# Patient Record
Sex: Female | Born: 1955
Health system: Southern US, Community
[De-identification: ages and names within clinical notes are randomized; demographics above are authoritative.]

## PROBLEM LIST (undated history)

## (undated) DIAGNOSIS — K635 Polyp of colon: Secondary | ICD-10-CM

## (undated) DIAGNOSIS — F419 Anxiety disorder, unspecified: Secondary | ICD-10-CM

## (undated) DIAGNOSIS — I10 Essential (primary) hypertension: Secondary | ICD-10-CM

## (undated) DIAGNOSIS — E7800 Pure hypercholesterolemia, unspecified: Secondary | ICD-10-CM

## (undated) HISTORY — DX: Anxiety disorder, unspecified: F41.9

## (undated) HISTORY — PX: ABDOMINAL HYSTERECTOMY: SHX81

## (undated) HISTORY — DX: Pure hypercholesterolemia, unspecified: E78.00

## (undated) HISTORY — PX: COLONOSCOPY: SHX5424

---

## 1999-06-13 ENCOUNTER — Emergency Department (HOSPITAL_COMMUNITY): Admission: EM | Admit: 1999-06-13 | Discharge: 1999-06-13 | Payer: Self-pay | Admitting: Emergency Medicine

## 1999-09-21 ENCOUNTER — Encounter: Payer: Self-pay | Admitting: Family Medicine

## 1999-09-21 ENCOUNTER — Ambulatory Visit (HOSPITAL_COMMUNITY): Admission: RE | Admit: 1999-09-21 | Discharge: 1999-09-21 | Payer: Self-pay | Admitting: Family Medicine

## 2002-05-31 ENCOUNTER — Other Ambulatory Visit: Admission: RE | Admit: 2002-05-31 | Discharge: 2002-05-31 | Payer: Self-pay | Admitting: Obstetrics & Gynecology

## 2003-11-22 ENCOUNTER — Emergency Department (HOSPITAL_COMMUNITY): Admission: EM | Admit: 2003-11-22 | Discharge: 2003-11-22 | Payer: Self-pay | Admitting: Emergency Medicine

## 2004-02-11 ENCOUNTER — Ambulatory Visit: Payer: Self-pay | Admitting: General Practice

## 2005-02-23 ENCOUNTER — Ambulatory Visit: Payer: Self-pay | Admitting: General Practice

## 2008-09-03 LAB — HM COLONOSCOPY: HM Colonoscopy: ABNORMAL

## 2010-09-07 LAB — HM PAP SMEAR: HM Pap smear: NORMAL

## 2011-02-23 LAB — HM DEXA SCAN

## 2011-03-06 LAB — HM MAMMOGRAPHY

## 2011-05-04 ENCOUNTER — Emergency Department (HOSPITAL_COMMUNITY)
Admission: EM | Admit: 2011-05-04 | Discharge: 2011-05-04 | Disposition: A | Discharge status: Home / Self Care | Payer: No Typology Code available for payment source | Source: Home / Self Care

## 2011-05-04 ENCOUNTER — Emergency Department (INDEPENDENT_AMBULATORY_CARE_PROVIDER_SITE_OTHER): Payer: No Typology Code available for payment source

## 2011-05-04 ENCOUNTER — Encounter (HOSPITAL_COMMUNITY): Payer: Self-pay | Admitting: Emergency Medicine

## 2011-05-04 DIAGNOSIS — J40 Bronchitis, not specified as acute or chronic: Secondary | ICD-10-CM

## 2011-05-04 HISTORY — DX: Essential (primary) hypertension: I10

## 2011-05-04 MED ORDER — BENZONATATE 100 MG PO CAPS: 100.0000 mg | ORAL_CAPSULE | Freq: Three times a day (TID) | ORAL | Status: AC | PRN

## 2011-05-04 MED ORDER — ALBUTEROL SULFATE HFA 108 (90 BASE) MCG/ACT IN AERS: 2.0000 | INHALATION_SPRAY | RESPIRATORY_TRACT | Status: DC | PRN

## 2011-05-04 NOTE — ED Provider Notes (Signed)
History     CSN: [id number]  Arrival date & time 05/04/11  1808   None     No chief complaint on file.   (Consider location/radiation/quality/duration/timing/severity/associated sxs/prior treatment) HPI Comments: Patient states she began getting sick 5 days ago with scratchy throat, cough, chills and fever to 101.  Patient states cough has been productive of mucus with small amount of blood in it.  States that she began taking over the counter cough medication and has since started having coughing fits during which she has difficulty stopping her cough.  Denies shortness of breath or chest pain . Patient does not have a hx of asthma or smoking.    The history is provided by the patient.    No past medical history on file.  No past surgical history on file.  No family history on file.  History  Substance Use Topics  . Smoking status: Not on file  . Smokeless tobacco: Not on file  . Alcohol Use: Not on file    OB History    No data available      Review of Systems  HENT: Negative for trouble swallowing.   Respiratory: Negative for shortness of breath.   Cardiovascular: Negative for chest pain.  All other systems reviewed and are negative.    Allergies  Review of patient's allergies indicates not on file.  Home Medications  No current outpatient prescriptions on file.  BP 143/82  Pulse 94  Temp(Src) 99.4 F (37.4 C) (Oral)  Resp 16  SpO2 98%  Physical Exam  Nursing note and vitals reviewed. Constitutional: She is oriented to person, place, and time. She appears well-developed and well-nourished.  HENT:  Head: Normocephalic and atraumatic.  Nose: Rhinorrhea present.  Mouth/Throat: Uvula is midline. Mucous membranes are not dry. No uvula swelling. Posterior oropharyngeal erythema present. No oropharyngeal exudate, posterior oropharyngeal edema or tonsillar abscesses.  Neck: Trachea normal, normal range of motion and phonation normal. Neck supple. No tracheal  tenderness present. No rigidity. No tracheal deviation and normal range of motion present.  Cardiovascular: Normal rate and regular rhythm.   Pulmonary/Chest: Effort normal and breath sounds normal. No stridor. No respiratory distress. She has no wheezes. She exhibits no tenderness.       ? Soft rales left base  Neurological: She is alert and oriented to person, place, and time.    ED Course  Procedures (including critical care time)  Labs Reviewed - No data to display Dg Chest 2 View  05/04/2011  *RADIOLOGY REPORT*  Clinical Data: 56 year old with cough.  CHEST - 2 VIEW  Comparison: None.  Findings: Two views of the chest were obtained.  The lungs are clear bilaterally. Heart and mediastinum are within normal limits. The trachea is midline.  IMPRESSION: No acute chest findings.  Original Report Authenticated By: Richarda Overlie, M.D.     1. Bronchitis       MDM  Patient with 5 days of cough, nasal congestion, now with coughing "spasms."  Patient's CXR is clear, oxygenating well, no coughing during exam.  Small amount of blood in sputum is likely from irritation from coughing given other cold symptoms and fever.  Onset of cough was gradual. Patient denies SOB.  Doubt pneumonia, doubt PE.  Patient d/c home with albuterol inhaler and tessalon perles as cough is worse during the day than at night.  Given precautions, reasons for immediate return.  Patient verbalizes understanding and agreement with plan.  Dillard Cannon [place], [place] 05/04/11 2127

## 2011-05-04 NOTE — ED Notes (Signed)
Cough, c/o head congestion, congestion with blood tinged, slight fever.

## 2011-05-05 NOTE — ED Notes (Signed)
Call from patient on answering machine, indicating she has questions regarding her mediations. No answer when number left on machine was called. Left message  on voice mail for patient to call back if she still had questions

## 2011-05-05 NOTE — ED Provider Notes (Signed)
Medical screening examination/treatment/procedure(s) were performed by non-physician practitioner and as supervising physician I was immediately available for consultation/collaboration.   [facility]; MD   Sharin Grave, MD 05/05/11 [phone]

## 2011-05-07 NOTE — ED Notes (Signed)
Pt came in today asking about her meds and why she wasn't given an antibiotic.  States she is still coughing, has not gotten worse, but she thought it would be gone by now.  Symptom onset 1 week ago.  I explained to her that it usually takes 7-10 days or more for bronchitis to get better.  Encouraged her to continue the meds, but return here if she is not getting any better next week or gets worse.  She voiced understanding.

## 2012-02-10 ENCOUNTER — Encounter: Payer: Self-pay | Admitting: *Deleted

## 2012-02-10 ENCOUNTER — Encounter: Payer: 59 | Attending: Family Medicine | Admitting: *Deleted

## 2012-02-10 VITALS — Ht 63.0 in | Wt 205.0 lb

## 2012-02-10 DIAGNOSIS — I10 Essential (primary) hypertension: Secondary | ICD-10-CM | POA: Insufficient documentation

## 2012-02-10 DIAGNOSIS — R7309 Other abnormal glucose: Secondary | ICD-10-CM | POA: Insufficient documentation

## 2012-02-10 DIAGNOSIS — Z713 Dietary counseling and surveillance: Secondary | ICD-10-CM | POA: Insufficient documentation

## 2012-02-10 DIAGNOSIS — E7800 Pure hypercholesterolemia, unspecified: Secondary | ICD-10-CM | POA: Insufficient documentation

## 2012-02-10 DIAGNOSIS — E669 Obesity, unspecified: Secondary | ICD-10-CM | POA: Insufficient documentation

## 2012-02-10 NOTE — Progress Notes (Signed)
  Medical Nutrition Therapy:  Appt start time: 1030 end time:  1130.  Assessment:  Obesity, Prediabetes, HTN, Elevated cholesterol. Laura Marsh is a [employer] employee here today for MNT.  Works at [employer]. States lunch is brought in by reps almost daily.  Excessive CHO and fat intake noted.  Does exercise 4-5 days/week for 40-60 min. States her BG and cholesterol was high at MD, but unable to recall the lab values. She has them at home.  Very interested in Bariatric Surgery. Gave booklet and explained the surgeries we do at [facility]. She plans to attend seminar on 02/15/12.    MEDICATIONS: See medication list.   DIETARY INTAKE:  Usual eating pattern includes 3 meals and 0-1 snacks per day.  24-hr recall:  B (AM): 6 pk nabs, coffee w/ creamer (10-12 oz) and 4 splendas  Snk ( AM): None  L (PM): Zoe's grilled chicken strips (7) on salad w/ [place] dressing (1/2c), 3 pita wedges, miniature cupcake; sweet tea (8-10 oz) Snk ( PM): None D (PM): 2 slices pepperoni pizza (frozen), 8-10 [place] fruit punch (12g sugar) Snk (PM): None Beverages: Punch, sweet tea, diet soda (not often), water (30-35 oz/day)  Usual physical activity: Usually walks 2-3 times/week @ 35-40 min (2 miles around [facility]); Zumba 1-2 times/week @ 60 min   Estimated energy needs: 1200 calories 135-150 g carbohydrates 80-85 g protein 35-40 g fat  Progress Towards Goal(s):  In progress.   Nutritional Diagnosis:  D'Hanis-3.3 Overweight/obesity As related to excessive CHO intake.  As evidenced by patient reported food recall and a BMI of 36.3 kg/m^2.    Intervention:  Nutrition education regarding carb metabolism, T2DM medications and how they work, carb counting, and portion control. Also discussed bariatric surgery and the differences between RYGB, LAGB, and Sleeve.  Handouts given during visit include:  Living Well with Diabetes  Meal Planning Card  Low CHO Snack List  30g and 45g CHO Meal Ideas  Bariatric Surgery  booklet  Monitoring/Evaluation:  Dietary intake, exercise, and body weight in 4 week(s).

## 2012-02-10 NOTE — Patient Instructions (Addendum)
Goals:   Switch to 1% milk and 1/2 & 1/2 tea  Decrease carb intake to 30g per meal/15g per snack (have protein with all carbs)  Limit high fat foods (i.e. Ranch dressing, etc)  Continue exercise regimen

## 2012-03-09 ENCOUNTER — Ambulatory Visit: Payer: 59 | Admitting: *Deleted

## 2012-03-11 ENCOUNTER — Encounter: Payer: Self-pay | Admitting: Dietician

## 2012-03-11 ENCOUNTER — Encounter: Payer: 59 | Attending: Family Medicine | Admitting: Dietician

## 2012-03-11 DIAGNOSIS — E669 Obesity, unspecified: Secondary | ICD-10-CM | POA: Insufficient documentation

## 2012-03-11 DIAGNOSIS — E7800 Pure hypercholesterolemia, unspecified: Secondary | ICD-10-CM | POA: Insufficient documentation

## 2012-03-11 DIAGNOSIS — Z713 Dietary counseling and surveillance: Secondary | ICD-10-CM | POA: Insufficient documentation

## 2012-03-11 DIAGNOSIS — R7309 Other abnormal glucose: Secondary | ICD-10-CM | POA: Insufficient documentation

## 2012-03-11 DIAGNOSIS — I10 Essential (primary) hypertension: Secondary | ICD-10-CM | POA: Insufficient documentation

## 2012-03-11 NOTE — Patient Instructions (Addendum)
Goals:  Follow Diabetes Meal Plan as instructed  Eat 3 meals and 2 snacks, every 3-5 hrs  Limit carbohydrate intake to 30 to 45grams carbohydrate/meal  Limit carbohydrate intake to 15 grams carbohydrate/snack  Add lean protein foods to meals/snacks  Monitor glucose levels as instructed by your doctor  Aim for 30 to 60  mins of physical activity daily  Bring food record and glucose log to your next nutrition visit 

## 2012-03-11 NOTE — Progress Notes (Signed)
Patient was seen on 03/11/12 for the complete series of three diabetes self-management courses at [facility]. Current A1c 6.3 on 02/2012  The following learning objectives were met by the patient during this course: Core 1:  Gain an understanding of diabetes and what causes it  Learn how diabetes is treated and the goals of treatment  Learn why, how and when to test BG  Learn how carbohydrate affects your glucose  Receive a personal food plan and learn how to count carbohydrates  Discover how physical activity enhances glucose control and overall health  Begin to gain confidence in your ability to manage diabetes  Handouts given during this class include:  Type 2 Diabetes: Basics Book  My Food Plan Book  Food and Activity Log  Core 2:  Describe causes, symptoms and treatment of hypoglycemia and hyperglycemia  Learn how to care for your glucose meter and strips  Explain how to manage diabetes during illness  List strategies to follow meal plan when dining out  Describe the effects of alcohol on glucose and how to use it safely  Describe problem solving skills for day-to-day glucose challenges  Describe ways to remain physically active  Describe the impact of regular activity on insulin resistance  Handouts given in this class:  Refrigerator magnet for Sick Day Guidelines  [facility] Oral Medication and Insulin handout  Core 3  Describe how diabetes changes over time  Understand why glucose Chun Sellen be out of target  Learn how diabetes changes over time  Learn about blood pressure, cholesterol, and heart health  Learn about lowering dietary fat and sodium  Understand the benefits of physical activity for heart health  Develop problem solving skills for times when glucose numbers are puzzling  Develop strategies for creating life balance  Learn how to identify if your treatment plan needs to change  Develop strategies for  dealing with stress, depression and staying motivated  Identify healthy weight-loss plans  Gain confidence that you can succeed in caring for your diabetes  Establish 2-3 goals that they will plan to diligently work on until they return                   for the free 4-month follow-up visit   The following handouts were given in class:  3 Month Follow Up Visit handout  Goal setting handout  Class evaluation form  Your patient has established the following 3 month goals for diabetes self-care:  Count Carbohydrates at most meals  Increase exercise to 5 days a week be active 30 minutes or more 3 times a week  Exercise to manage stress  Follow-Up Plan: Patient was offered a 3 month follow-up visit for diabetes self-management education.

## 2012-04-13 ENCOUNTER — Other Ambulatory Visit: Payer: Self-pay | Admitting: Family Medicine

## 2012-04-13 ENCOUNTER — Ambulatory Visit (INDEPENDENT_AMBULATORY_CARE_PROVIDER_SITE_OTHER): Payer: 59 | Admitting: Family Medicine

## 2012-04-13 ENCOUNTER — Encounter: Payer: Self-pay | Admitting: Family Medicine

## 2012-04-13 VITALS — BP 130/80 | HR 108 | Temp 98.1°F | Wt 209.0 lb

## 2012-04-13 DIAGNOSIS — E119 Type 2 diabetes mellitus without complications: Secondary | ICD-10-CM | POA: Insufficient documentation

## 2012-04-13 DIAGNOSIS — J069 Acute upper respiratory infection, unspecified: Secondary | ICD-10-CM

## 2012-04-13 DIAGNOSIS — I152 Hypertension secondary to endocrine disorders: Secondary | ICD-10-CM | POA: Insufficient documentation

## 2012-04-13 DIAGNOSIS — Z7189 Other specified counseling: Secondary | ICD-10-CM

## 2012-04-13 DIAGNOSIS — E785 Hyperlipidemia, unspecified: Secondary | ICD-10-CM | POA: Insufficient documentation

## 2012-04-13 DIAGNOSIS — Z7689 Persons encountering health services in other specified circumstances: Secondary | ICD-10-CM

## 2012-04-13 DIAGNOSIS — I10 Essential (primary) hypertension: Secondary | ICD-10-CM

## 2012-04-13 MED ORDER — TRIAMTERENE-HCTZ 37.5-25 MG PO TABS: 1.0000 | ORAL_TABLET | Freq: Every day | ORAL | Status: DC

## 2012-04-13 MED ORDER — LISINOPRIL 20 MG PO TABS: 20.0000 mg | ORAL_TABLET | Freq: Every day | ORAL | Status: DC

## 2012-04-13 MED ORDER — GUAIFENESIN-CODEINE 100-10 MG/5ML PO SYRP: 5.0000 mL | ORAL_SOLUTION | Freq: Three times a day (TID) | ORAL | Status: DC | PRN

## 2012-04-13 MED ORDER — METFORMIN HCL ER 500 MG PO TB24: 500.0000 mg | ORAL_TABLET | Freq: Every day | ORAL | Status: DC

## 2012-04-13 NOTE — Patient Instructions (Signed)
INSTRUCTIONS FOR UPPER RESPIRATORY INFECTION:  -plenty of rest and fluids  -nasal saline wash (NeilMed) 2-3 times daily (use prepackaged nasal saline or bottled/distilled water if making your own)   -can use sinex nasal spray for drainage and nasal congestion - but do NOT use longer then 3-4 days  -can use tylenol or ibuprofen as directed for aches and sorethroat  -in the winter time, using a humidifier at night is helpful (please follow cleaning instructions)  -if you are taking a cough medication - use only as directed, may also try a teaspoon of honey to coat the throat and throat lozenges  -for sore throat, salt water gargles can help  -follow up if you have fevers, facial pain, tooth pain, difficulty breathing or are worsening or not getting better in 5-7 days   FOR YOUR BLOOD SUGAR, HIGH BLOOD PRESSURE, CHOLESTEROL AND WEIGHT: We recommend the following healthy lifestyle measures: - eat a healthy diet consisting of lots of vegetables, fruits, beans, nuts, seeds, healthy meats such as white chicken and fish and whole grains.  - avoid fried foods, fast food, processed foods, sodas, red meet and other fattening foods.  - get a least 150 minutes of aerobic exercise per week.    FOLLOW UP IN 2 MONTHS - we will check labs then

## 2012-04-13 NOTE — Telephone Encounter (Signed)
Pt needs refill on triamterene-hctz 37.5-25 mg #90 sent to [place] out[facility] pharm

## 2012-04-13 NOTE — Progress Notes (Signed)
Chief Complaint  Patient presents with  . Establish Care  . URI    HPI:  Laura Marsh is here to establish care. Recently moved back from [place] to this area.  Last PCP and physical: used to see tonya martin and candace smith Work: Engineer, site at [employer] Situation: lives with daughters  Spiritual Beliefs:  Has the following chronic problems and concerns today:  URI: -started:6 days ago -symptoms:nasal congestion, sore throat, scratchy  -denies:fever, SOB, NVD, tooth pain, strep or mono exposure -has tried: robitussin -sick contacts: daughter and sister sick with similar symptoms -Hx of: sinusitis  Prediabetes: -has seen diabetic educator/nutrition in last several months -metformin 500 daily -Nov HgbA1c 6.3 per review of notes  HTN/HLD/Obesity: -takes fish oil, had cholesterol -taking lisinopril and Maxzide  - doesn't take the maxide every day -lifestyle: goes to the gym 2-3 times per week, working on nutrition  Other Providers: [facility] gyn  Health Maintenance: -gyn, Chief Technology Officer - had pap and physical this summer and mammo -had colonoscopy this summer - supposed to repeat in 2 years due to hx of polyp -UTD on vaccine  ROS: See pertinent positives and negatives per HPI.  Past Medical History  Diagnosis Date  . Diabetes mellitus   . Hypertension   . Blood glucose elevated   . Elevated cholesterol     Family History  Problem Relation Age of Onset  . Diabetes Mother   . Congestive Heart Failure Mother     History   Social History  . Marital Status: Divorced    Spouse Name: N/A    Number of Children: N/A  . Years of Education: N/A   Social History Main Topics  . Smoking status: Never Smoker   . Smokeless tobacco: None  . Alcohol Use: No  . Drug Use: No  . Sexually Active:    Other Topics Concern  . None   Social History Narrative  . None    Current outpatient prescriptions:aspirin 81 MG tablet, Take 160 mg by  mouth daily., Disp: , Rfl: ;  [employer] FIBER GUMMIES PO, Take 1 each by mouth daily., Disp: , Rfl: ;  fish oil-omega-3 fatty acids 1000 MG capsule, Take 2 g by mouth every other day., Disp: , Rfl: ;  lisinopril (PRINIVIL,ZESTRIL) 20 MG tablet, Take 1 tablet (20 mg total) by mouth daily., Disp: 90 tablet, Rfl: 3 loratadine (CLARITIN) 10 MG tablet, Take 10 mg by mouth daily., Disp: , Rfl: ;  metFORMIN (GLUCOPHAGE-XR) 500 MG 24 hr tablet, Take 1 tablet (500 mg total) by mouth daily with breakfast., Disp: 90 tablet, Rfl: 3;  Multiple Vitamin (MULTIVITAMIN) capsule, Take 1 capsule by mouth daily., Disp: , Rfl: ;  triamterene-hydrochlorothiazide (MAXZIDE-25) 37.5-25 MG per tablet, Take 1 each (1 tablet total) by mouth daily., Disp: 90 tablet, Rfl: 3 guaiFENesin-codeine (ROBITUSSIN AC) 100-10 MG/5ML syrup, Take 5 mLs by mouth 3 (three) times daily as needed for cough., Disp: 120 mL, Rfl: 0  EXAM:  Filed Vitals:   04/13/12 1621  BP: 130/80  Pulse: 108  Temp: 98.1 F (36.7 C)    There is no height on file to calculate BMI.  GENERAL: vitals reviewed and listed above, alert, oriented, appears well hydrated and in no acute distress  HEENT: atraumatic, conjunttiva clear, no obvious abnormalities on inspection of external nose and ears, normal appearance of ear canals and TMs, clear nasal congestion, mild post oropharyngeal erythema with PND, no tonsillar edema or exudate, no sinus TTP  NECK:  no obvious masses on inspection  LUNGS: clear to auscultation bilaterally, no wheezes, rales or rhonchi, good air movement  CV: HRRR, no peripheral edema  MS: moves all extremities without noticeable abnormality  PSYCH: pleasant and cooperative, no obvious depression or anxiety  ASSESSMENT AND PLAN:  Discussed the following assessment and plan:  1. Upper respiratory infection -likely viral, discussed supportive care and return precautions  guaiFENesin-codeine (ROBITUSSIN AC) 100-10 MG/5ML syrup  2. Diabetes   Cont metformin, extensive lifestyle counseling  3. Hypertension  Cont current meds - advised to take every day  4. Hyperlipidemia  Continue current meds - will check next appt   -We reviewed the PMH, PSH, FH, SH, Meds and Allergies. -We provided refills for any medications we will prescribe as needed. -We addressed current concerns per orders and patient instructions. -We have asked for records for pertinent exams, studies, vaccines and notes from previous providers. -We have advised patient to follow up per instructions below and will check labs at that appt   -Patient advised to return or notify a doctor immediately if symptoms worsen or persist or new concerns arise.  Patient Instructions  INSTRUCTIONS FOR UPPER RESPIRATORY INFECTION:  -plenty of rest and fluids  -nasal saline wash (NeilMed) 2-3 times daily (use prepackaged nasal saline or bottled/distilled water if making your own)   -can use sinex nasal spray for drainage and nasal congestion - but do NOT use longer then 3-4 days  -can use tylenol or ibuprofen as directed for aches and sorethroat  -in the winter time, using a humidifier at night is helpful (please follow cleaning instructions)  -if you are taking a cough medication - use only as directed, may also try a teaspoon of honey to coat the throat and throat lozenges  -for sore throat, salt water gargles can help  -follow up if you have fevers, facial pain, tooth pain, difficulty breathing or are worsening or not getting better in 5-7 days   FOR YOUR BLOOD SUGAR, HIGH BLOOD PRESSURE, CHOLESTEROL AND WEIGHT: We recommend the following healthy lifestyle measures: - eat a healthy diet consisting of lots of vegetables, fruits, beans, nuts, seeds, healthy meats such as white chicken and fish and whole grains.  - avoid fried foods, fast food, processed foods, sodas, red meet and other fattening foods.  - get a least 150 minutes of aerobic exercise per week.    FOLLOW UP  IN 2 MONTHS - we will check labs then      Kriste Basque R.

## 2012-04-14 NOTE — Telephone Encounter (Signed)
Dr. Selena Batten sent in a 90 day supply with 3 refills of Triameterene-hctz, lisinopril and metformin 0n 04/13/12

## 2012-04-19 ENCOUNTER — Encounter: Payer: Self-pay | Admitting: Family Medicine

## 2012-04-21 ENCOUNTER — Encounter: Payer: Self-pay | Admitting: Family Medicine

## 2012-04-21 NOTE — Progress Notes (Signed)
Reviewed some recent OV notes and labs from prior PCP Dr. Lenard Simmer - records scanned into system.  Health maintenance updated  Problem list from previous PCP: DM, LVH, colon polyps, HTN, obesity, Allergic Rhinitis, hemorrhoids, HLD

## 2012-05-30 ENCOUNTER — Ambulatory Visit: Payer: 59 | Admitting: Family Medicine

## 2012-05-30 ENCOUNTER — Telehealth: Payer: Self-pay | Admitting: Family Medicine

## 2012-05-30 DIAGNOSIS — I10 Essential (primary) hypertension: Secondary | ICD-10-CM

## 2012-05-30 NOTE — Telephone Encounter (Signed)
Patient requesting refill on lisinopril (PRINIVIL,ZESTRIL) 20 MG tablet. Please send to [facility] on [address].

## 2012-05-30 NOTE — Telephone Encounter (Signed)
Called and spoke with Verion[facility] at the Outpatient pharmacy and pt's rx is ready for pick up. Sent in on 04/13/12.

## 2012-06-13 ENCOUNTER — Ambulatory Visit: Payer: 59 | Admitting: Dietician

## 2012-06-13 ENCOUNTER — Encounter: Payer: 59 | Admitting: Family Medicine

## 2012-06-13 DIAGNOSIS — Z0289 Encounter for other administrative examinations: Secondary | ICD-10-CM

## 2012-06-13 NOTE — Progress Notes (Signed)
No show  This encounter was created in error - please disregard.

## 2012-06-20 ENCOUNTER — Telehealth: Payer: Self-pay | Admitting: Family Medicine

## 2012-06-20 NOTE — Telephone Encounter (Signed)
Patient called and stated that she was completely out of her triamterene-hydrochlorothiazide (MAXZIDE-25) 37.5-25 MG. Patient would like it called in to [facility] on [address], outpatient. Please assist.

## 2012-06-20 NOTE — Telephone Encounter (Signed)
Called the pharmacy and spoke with Arlys John who states pt had 1 refill left on a previous rx so that one sent on 1/9 was put on hold for pt until now. Rx is ready for pickup.

## 2012-06-21 NOTE — Telephone Encounter (Signed)
Left a message for pt that rx ready at pharmacy.

## 2012-06-22 ENCOUNTER — Ambulatory Visit (INDEPENDENT_AMBULATORY_CARE_PROVIDER_SITE_OTHER): Payer: 59 | Admitting: Family Medicine

## 2012-06-22 VITALS — BP 110/78 | HR 80 | Temp 97.9°F | Resp 16 | Ht 63.0 in | Wt 208.0 lb

## 2012-06-22 DIAGNOSIS — I10 Essential (primary) hypertension: Secondary | ICD-10-CM

## 2012-06-22 DIAGNOSIS — E119 Type 2 diabetes mellitus without complications: Secondary | ICD-10-CM

## 2012-06-22 DIAGNOSIS — E785 Hyperlipidemia, unspecified: Secondary | ICD-10-CM

## 2012-06-22 NOTE — Progress Notes (Signed)
Chief Complaint  Patient presents with  . Diabetes  . Hypertension  . Allergic Rhinitis     HPI:  Follow up:  Prediabetes: -on metformin prior to seeing me, she reports she stopped taking the metformin a few weeks ago as she felt like was making hands and feet tingle - feels better off it -has seen diabetes educator -HgbA1c 6.3 02/2012  HTN/HLD/Obesity: -takes fish oil, lisinopril, maxide -was goes to gym several times per week, working on diet - has been working on diet in terms of avoiding carbs and sweetened drinks but has a sweet tooth  ROS: See pertinent positives and negatives per HPI.  Past Medical History  Diagnosis Date  . Diabetes mellitus   . Hypertension   . Blood glucose elevated   . Elevated cholesterol     Family History  Problem Relation Age of Onset  . Diabetes Mother   . Congestive Heart Failure Mother     History   Social History  . Marital Status: Divorced    Spouse Name: N/A    Number of Children: N/A  . Years of Education: N/A   Social History Main Topics  . Smoking status: Never Smoker   . Smokeless tobacco: Not on file  . Alcohol Use: No  . Drug Use: No  . Sexually Active:    Other Topics Concern  . Not on file   Social History Narrative  . No narrative on file    Current outpatient prescriptions:aspirin 81 MG tablet, Take 81 mg by mouth every other day. , Disp: , Rfl: ;  fish oil-omega-3 fatty acids 1000 MG capsule, Take 2 g by mouth every other day., Disp: , Rfl: ;  lisinopril (PRINIVIL,ZESTRIL) 20 MG tablet, Take 1 tablet (20 mg total) by mouth daily., Disp: 90 tablet, Rfl: 3;  loratadine (CLARITIN) 10 MG tablet, Take 10 mg by mouth daily., Disp: , Rfl:  Multiple Vitamin (MULTIVITAMIN) capsule, Take 1 capsule by mouth daily., Disp: , Rfl: ;  triamterene-hydrochlorothiazide (MAXZIDE-25) 37.5-25 MG per tablet, Take 1 each (1 tablet total) by mouth daily., Disp: 90 tablet, Rfl: 3  EXAM:  Filed Vitals:   06/22/12 1523  BP: 110/78   Pulse: 80  Temp: 97.9 F (36.6 C)  Resp: 16    Body mass index is 36.85 kg/(m^2).  GENERAL: vitals reviewed and listed above, alert, oriented, appears well hydrated and in no acute distress  HEENT: atraumatic, conjunttiva clear, no obvious abnormalities on inspection of external nose and ears  NECK: no obvious masses on inspection  LUNGS: clear to auscultation bilaterally, no wheezes, rales or rhonchi, good air movement  CV: HRRR, no peripheral edema  MS: moves all extremities without noticeable abnormality  PSYCH: pleasant and cooperative, no obvious depression or anxiety  ASSESSMENT AND PLAN:  Discussed the following assessment and plan:  Hyperlipidemia - Plan: Lipid Panel  Hypertension - Plan: Basic metabolic panel  Diabetes - Plan: Hemoglobin A1c  -lifestyle counseling and encourage to improve diet and increase exercise - discussed meal options and diet and exercise at length -she wants to return for FASTING LABS next week as has eaten poorly this week -continue current medications -follow up in 4 months to recheck labs off metformin - she will check fasting BS and keep log in the meantime -Patient advised to return or notify a doctor immediately if symptoms worsen or persist or new concerns arise.  There are no Patient Instructions on file for this visit.   Kriste Basque R.

## 2012-06-22 NOTE — Patient Instructions (Addendum)
-  schedule lab appointment for fasting labs for next thursday  - check FASTING blood sugar 1 time per week and keep log to bring to next appointment  -exercise at least 30 minutes at least 4 times per week  -work on a healthy diet - try to replace sweets with fresh fruit - take fresh fruit with you every day to work  -use stevia in place of sugar for beverages  -follow up in 3-4 months

## 2012-06-29 ENCOUNTER — Other Ambulatory Visit (INDEPENDENT_AMBULATORY_CARE_PROVIDER_SITE_OTHER): Payer: 59

## 2012-06-29 DIAGNOSIS — E785 Hyperlipidemia, unspecified: Secondary | ICD-10-CM

## 2012-06-29 DIAGNOSIS — E119 Type 2 diabetes mellitus without complications: Secondary | ICD-10-CM

## 2012-06-29 LAB — HEMOGLOBIN A1C: Hgb A1c MFr Bld: 6.5 % (ref 4.6–6.5)

## 2012-06-29 LAB — LDL CHOLESTEROL, DIRECT: Direct LDL: 173.1 mg/dL

## 2012-06-29 LAB — LIPID PANEL
Cholesterol: 241 mg/dL — ABNORMAL HIGH (ref 0–200)
HDL: 46.2 mg/dL (ref >39.00)
Triglycerides: 118 mg/dL (ref 0.0–149.0)

## 2012-06-30 ENCOUNTER — Telehealth: Payer: Self-pay | Admitting: Family Medicine

## 2012-06-30 NOTE — Telephone Encounter (Signed)
Please let her know. The diabetes is a little worse and her cholesterol is too high. She is going to need to work really hard on increased exercise and eating right. Schedule follow up in 3-4 months for early morning appt and will recheck fasting labs then and decide if need to add back metformin and cholesterol medicine.

## 2012-07-03 NOTE — Telephone Encounter (Signed)
Left a detailed message for pt and designated phone number.

## 2012-08-02 ENCOUNTER — Ambulatory Visit: Payer: 59 | Admitting: *Deleted

## 2012-09-21 ENCOUNTER — Ambulatory Visit (INDEPENDENT_AMBULATORY_CARE_PROVIDER_SITE_OTHER): Payer: 59 | Admitting: Family Medicine

## 2012-09-21 ENCOUNTER — Ambulatory Visit: Payer: 59 | Admitting: Family Medicine

## 2012-09-21 ENCOUNTER — Encounter: Payer: Self-pay | Admitting: Family Medicine

## 2012-09-21 VITALS — Temp 98.2°F | Wt 201.0 lb

## 2012-09-21 DIAGNOSIS — K625 Hemorrhage of anus and rectum: Secondary | ICD-10-CM

## 2012-09-21 DIAGNOSIS — R195 Other fecal abnormalities: Secondary | ICD-10-CM

## 2012-09-21 DIAGNOSIS — E119 Type 2 diabetes mellitus without complications: Secondary | ICD-10-CM

## 2012-09-21 DIAGNOSIS — E785 Hyperlipidemia, unspecified: Secondary | ICD-10-CM

## 2012-09-21 DIAGNOSIS — I10 Essential (primary) hypertension: Secondary | ICD-10-CM

## 2012-09-21 NOTE — Progress Notes (Addendum)
Chief Complaint  Patient presents with  . 3 month follow up    HPI:  Follow up:  1)HTN: -on lisinopril and maxide -denies: CP, SOB, DOE, changes in swelling  2)HLD: -stable, was taking fish oil but stopped taking it -she is reluctant to take medications, working on lifestyle changes last visit -she is doing zumba or walking 3 days per week, diet is not great  3)Mild Diabetes: -on metformin temporarily in the past from prior PCP -last HgbA1c 6.5 -denies: polyuria, polydipsia, vision changes  4)Obesity -gym several days per week, working on diet  5)URI: -started 1.5 weeks ago -symptoms: congestion, cough, drainage, scratchy throat, ears stopped up - feels like not getting better -denies: fevers, tooth pain, sinus pain -taking cough medications  6)BRBPR: -streaks of blood in stool almost every BM for 2-3 months -reports had colonoscopy in the past for this and had polyps, last colonoscopy 2013 and reports told needed another in 2 years -denies unintentional weight lost, constipation, diarrhea, nausea, vomiting, hemorrhoids -reports GP and aunts with colon cancer -wants to see GI  ROS: See pertinent positives and negatives per HPI.  Past Medical History  Diagnosis Date  . Diabetes mellitus   . Hypertension   . Blood glucose elevated   . Elevated cholesterol     Family History  Problem Relation Age of Onset  . Diabetes Mother   . Congestive Heart Failure Mother     History   Social History  . Marital Status: Divorced    Spouse Name: N/A    Number of Children: N/A  . Years of Education: N/A   Social History Main Topics  . Smoking status: Never Smoker   . Smokeless tobacco: None  . Alcohol Use: No  . Drug Use: No  . Sexually Active:    Other Topics Concern  . None   Social History Narrative  . None    Current outpatient prescriptions:aspirin 81 MG tablet, Take 81 mg by mouth every other day. , Disp: , Rfl: ;  fish oil-omega-3 fatty acids 1000 MG  capsule, Take 2 g by mouth every other day., Disp: , Rfl: ;  lisinopril (PRINIVIL,ZESTRIL) 20 MG tablet, Take 1 tablet (20 mg total) by mouth daily., Disp: 90 tablet, Rfl: 3;  loratadine (CLARITIN) 10 MG tablet, Take 10 mg by mouth daily., Disp: , Rfl:  Multiple Vitamin (MULTIVITAMIN) capsule, Take 1 capsule by mouth daily., Disp: , Rfl: ;  triamterene-hydrochlorothiazide (MAXZIDE-25) 37.5-25 MG per tablet, Take 1 each (1 tablet total) by mouth daily., Disp: 90 tablet, Rfl: 3  EXAM:  Filed Vitals:   09/21/12 1525  Temp: 98.2 F (36.8 C)    Body mass index is 35.61 kg/(m^2).  GENERAL: vitals reviewed and listed above, alert, oriented, appears well hydrated and in no acute distress  HEENT: atraumatic, conjunttiva clear, no obvious abnormalities on inspection of external nose and ears, normal appearance of ear canals and TMs, clear nasal congestion, mild post oropharyngeal erythema with PND, no tonsillar edema or exudate, no sinus TTP  NECK: no obvious masses on inspection  LUNGS: clear to auscultation bilaterally, no wheezes, rales or rhonchi, good air movement  CV: HRRR, no peripheral edema  MS: moves all extremities without noticeable abnormality  PSYCH: pleasant and cooperative, no obvious depression or anxiety  ASSESSMENT AND PLAN:  Discussed the following assessment and plan:  Hyperlipidemia - Plan: Lipid Panel  Hypertension - Plan: Basic metabolic panel  Diabetes - Plan: Hemoglobin A1c  Occult blood in stools - Plan:  Ambulatory referral to Gastroenterology  BRBPR (bright red blood per rectum)  -URI - likely viral - advised supportive care and return precautions -will do fasting labs in 2 weeks - discussed if lipids still elevated starting low dose pravastatin and she is ok with this, risks discussed. If HgbA1c >7 will restart metformin. -referral to GI per her request for BRBPR, hx colon polyps. -Patient advised to return or notify a doctor immediately if symptoms  worsen or persist or new concerns arise.  Patient Instructions  We recommend the following healthy lifestyle measures: - eat a healthy diet consisting of lots of vegetables, fruits, beans, nuts, seeds, healthy meats such as white chicken and fish and whole grains.  - avoid fried foods, fast food, processed foods, sodas, red meet and other fattening foods.  - get a least 150 minutes of aerobic exercise per week.   Schedule fasting lab visit for July 1st  Use afrin for 3-4 days, then stop - follow up if sinus issues persist  Follow up in: pending lab results but likely in 3-4 month      Jaremy Nosal R.

## 2012-09-21 NOTE — Patient Instructions (Signed)
We recommend the following healthy lifestyle measures: - eat a healthy diet consisting of lots of vegetables, fruits, beans, nuts, seeds, healthy meats such as white chicken and fish and whole grains.  - avoid fried foods, fast food, processed foods, sodas, red meet and other fattening foods.  - get a least 150 minutes of aerobic exercise per week.   Schedule fasting lab visit for July 1st  Use afrin for 3-4 days, then stop - follow up if sinus issues persist  Follow up in: pending lab results but likely in 3-4 month

## 2012-10-04 ENCOUNTER — Other Ambulatory Visit: Payer: 59

## 2012-10-10 ENCOUNTER — Ambulatory Visit: Payer: 59 | Admitting: Family Medicine

## 2012-10-16 ENCOUNTER — Other Ambulatory Visit (INDEPENDENT_AMBULATORY_CARE_PROVIDER_SITE_OTHER): Payer: 59

## 2012-10-16 DIAGNOSIS — E119 Type 2 diabetes mellitus without complications: Secondary | ICD-10-CM

## 2012-10-16 DIAGNOSIS — I10 Essential (primary) hypertension: Secondary | ICD-10-CM

## 2012-10-16 DIAGNOSIS — E785 Hyperlipidemia, unspecified: Secondary | ICD-10-CM

## 2012-10-16 LAB — HM MAMMOGRAPHY: HM Mammogram: NEGATIVE

## 2012-10-16 LAB — BASIC METABOLIC PANEL
BUN: 16 mg/dL (ref 6–23)
CO2: 27 mEq/L (ref 19–32)
Calcium: 9.2 mg/dL (ref 8.4–10.5)
GFR: 89.36 mL/min (ref >60.00)
Glucose, Bld: 123 mg/dL — ABNORMAL HIGH (ref 70–99)

## 2012-10-16 LAB — LIPID PANEL
Cholesterol: 212 mg/dL — ABNORMAL HIGH (ref 0–200)
HDL: 46.7 mg/dL (ref >39.00)
VLDL: 23 mg/dL (ref 0.0–40.0)

## 2012-10-17 MED ORDER — PRAVASTATIN SODIUM 20 MG PO TABS: 20.0000 mg | ORAL_TABLET | Freq: Every day | ORAL | Status: DC

## 2012-10-17 NOTE — Progress Notes (Signed)
Quick Note:  Spoke with pt about lab results. Rx sent to [facility]. ______

## 2012-10-17 NOTE — Addendum Note (Signed)
Addended by: Azucena Freed on: 10/17/2012 03:07 PM   Modules accepted: Orders

## 2012-10-17 NOTE — Progress Notes (Signed)
Quick Note:  Left a detailed message for pt about labs; left a message for pt to return call to for the pharmacy to send rx to. ______

## 2012-10-23 ENCOUNTER — Other Ambulatory Visit: Payer: Self-pay

## 2012-10-23 DIAGNOSIS — E119 Type 2 diabetes mellitus without complications: Secondary | ICD-10-CM

## 2012-10-23 MED ORDER — GLUCOSE BLOOD VI STRP: ORAL_STRIP | Status: DC

## 2012-10-23 MED ORDER — TRUEPLUS SAFETY LANCETS 28G MISC: 1.0000 | Status: DC

## 2012-10-23 MED ORDER — ALCOHOL SWABS PADS: MEDICATED_PAD | Status: AC

## 2012-10-23 NOTE — Telephone Encounter (Signed)
Received request from [facility] for TrueResult test strips. Lancets, alcohol swab to [facility].

## 2012-11-06 ENCOUNTER — Encounter: Payer: Self-pay | Admitting: Family Medicine

## 2012-11-28 ENCOUNTER — Encounter: Payer: Self-pay | Admitting: Family Medicine

## 2013-01-18 ENCOUNTER — Encounter: Payer: Self-pay | Admitting: Family Medicine

## 2013-01-18 NOTE — Patient Instructions (Signed)
Received notes from [facility] pt seen on 10/14 for [facility] to [facility] follow up DM.

## 2013-02-16 ENCOUNTER — Ambulatory Visit: Payer: 59 | Admitting: Family Medicine

## 2013-02-19 ENCOUNTER — Ambulatory Visit: Payer: 59 | Admitting: Family Medicine

## 2013-02-19 ENCOUNTER — Encounter: Payer: Self-pay | Admitting: Family Medicine

## 2013-02-19 ENCOUNTER — Ambulatory Visit (INDEPENDENT_AMBULATORY_CARE_PROVIDER_SITE_OTHER): Payer: 59 | Admitting: Family Medicine

## 2013-02-19 VITALS — BP 128/90 | Temp 98.2°F | Wt 210.0 lb

## 2013-02-19 DIAGNOSIS — I10 Essential (primary) hypertension: Secondary | ICD-10-CM

## 2013-02-19 DIAGNOSIS — E119 Type 2 diabetes mellitus without complications: Secondary | ICD-10-CM

## 2013-02-19 DIAGNOSIS — E785 Hyperlipidemia, unspecified: Secondary | ICD-10-CM

## 2013-02-19 LAB — LIPID PANEL
Cholesterol: 208 mg/dL — ABNORMAL HIGH (ref 0–200)
HDL: 54.7 mg/dL (ref >39.00)
Total CHOL/HDL Ratio: 4
VLDL: 24.6 mg/dL (ref 0.0–40.0)

## 2013-02-19 LAB — BASIC METABOLIC PANEL
BUN: 15 mg/dL (ref 6–23)
Calcium: 10.1 mg/dL (ref 8.4–10.5)
Chloride: 103 mEq/L (ref 96–112)
Creatinine, Ser: 0.8 mg/dL (ref 0.4–1.2)
GFR: 91.73 mL/min (ref >60.00)

## 2013-02-19 LAB — HEMOGLOBIN A1C: Hgb A1c MFr Bld: 7.1 % — ABNORMAL HIGH (ref 4.6–6.5)

## 2013-02-19 LAB — MICROALBUMIN / CREATININE URINE RATIO
Creatinine,U: 151.7 mg/dL
Microalb, Ur: 0.8 mg/dL (ref 0.0–1.9)

## 2013-02-19 NOTE — Progress Notes (Signed)
Pre visit review using our clinic review tool, if applicable. No additional management support is needed unless otherwise documented below in the visit note. 

## 2013-02-19 NOTE — Patient Instructions (Signed)
-  schedule your appointment with the colon doctor (gastroenterologist)  We recommend the following healthy lifestyle measures: - eat a healthy diet consisting of lots of vegetables, fruits, beans, nuts, seeds, healthy meats such as white chicken and fish and whole grains.  - avoid fried foods, fast food, processed foods, sodas, red meet and other fattening foods.  - get a least 150 minutes of aerobic exercise per week.   -We have ordered labs or studies at this visit. It can take up to 1-2 weeks for results and processing. We will contact you with instructions IF your results are abnormal. Normal results will be released to your [facility]. If you have not heard from [place] or can not find your results in [facility] in 2 weeks please contact our office.   Follow up in 3-4 months

## 2013-02-19 NOTE — Progress Notes (Signed)
Chief Complaint  Patient presents with  . Follow-up    HPI:  Follow up:  HTN: -meds: lisinopril, maxide -denies: CP, swelling, palpitation  HLD: -started pravastatin last visit -denies: leg cramps Lab Results  Component Value Date   CHOL 212* 10/16/2012   HDL 46.70 10/16/2012   LDLDIRECT 148.8 10/16/2012   TRIG 115.0 10/16/2012   CHOLHDL 5 10/16/2012   Mild Diabetes: -diet controlled -home BS: [address]  -stable -last eye exam  -last foot exam Lab Results  Component Value Date   HGBA1C 6.7* 10/16/2012  -lifestyle: started walking again recently, working on diet -denies:polyuria, polydipsia, foot lesions  Hx of BRBPR and colon polyps: -per last visit referred to GI -she reports she is going to schedule appt ROS: See pertinent positives and negatives per HPI.   Past Medical History  Diagnosis Date  . Diabetes mellitus   . Hypertension   . Blood glucose elevated   . Elevated cholesterol     Past Surgical History  Procedure Laterality Date  . Abdominal hysterectomy      Family History  Problem Relation Age of Onset  . Diabetes Mother   . Congestive Heart Failure Mother     History   Social History  . Marital Status: Divorced    Spouse Name: N/A    Number of Children: N/A  . Years of Education: N/A   Social History Main Topics  . Smoking status: Never Smoker   . Smokeless tobacco: None  . Alcohol Use: No  . Drug Use: No  . Sexual Activity:    Other Topics Concern  . None   Social History Narrative  . None    Current outpatient prescriptions:Alcohol Swabs PADS, Use as directed., Disp: 100 each, Rfl: 12;  aspirin 81 MG tablet, Take 81 mg by mouth every other day. , Disp: , Rfl: ;  glucose blood (TRUETEST TEST) test strip, Test twice weekly., Disp: 100 each, Rfl: 12;  lisinopril (PRINIVIL,ZESTRIL) 20 MG tablet, Take 1 tablet (20 mg total) by mouth daily., Disp: 90 tablet, Rfl: 3 loratadine (CLARITIN) 10 MG tablet, Take 10 mg by mouth daily., Disp: ,  Rfl: ;  Multiple Vitamin (MULTIVITAMIN) capsule, Take 1 capsule by mouth daily., Disp: , Rfl: ;  pravastatin (PRAVACHOL) 20 MG tablet, Take 1 tablet (20 mg total) by mouth daily., Disp: 90 tablet, Rfl: 1;  triamterene-hydrochlorothiazide (MAXZIDE-25) 37.5-25 MG per tablet, Take 1 each (1 tablet total) by mouth daily., Disp: 90 tablet, Rfl: 3 TRUEPLUS SAFETY LANCETS 28G MISC, 1 Device by Does not apply route 2 (two) times a week., Disp: 100 each, Rfl: 12;  fish oil-omega-3 fatty acids 1000 MG capsule, Take 2 g by mouth every other day., Disp: , Rfl:   EXAM:  Filed Vitals:   02/19/13 0913  BP: 128/90  Temp: 98.2 F (36.8 C)    Body mass index is 37.21 kg/(m^2).  GENERAL: vitals reviewed and listed above, alert, oriented, appears well hydrated and in no acute distress  HEENT: atraumatic, conjunttiva clear, no obvious abnormalities on inspection of external nose and ears  NECK: no obvious masses on inspection  LUNGS: clear to auscultation bilaterally, no wheezes, rales or rhonchi, good air movement  CV: HRRR, no peripheral edema  MS: moves all extremities without noticeable abnormality  PSYCH: pleasant and cooperative, no obvious depression or anxiety  ASSESSMENT AND PLAN:  Discussed the following assessment and plan:  Hyperlipidemia - Plan: Lipid Panel  Hypertension - Plan: Basic metabolic panel  Diabetes - Plan:  Hemoglobin A1c, Microalbumin/Creatinine Ratio, Urine  -lifestsyle recs -FASTING labs -advised to schedule GI appt -foot exam today -follow up 3-4 months -Patient advised to return or notify a doctor immediately if symptoms worsen or persist or new concerns arise.  Patient Instructions  -schedule your appointment with the colon doctor (gastroenterologist)  We recommend the following healthy lifestyle measures: - eat a healthy diet consisting of lots of vegetables, fruits, beans, nuts, seeds, healthy meats such as white chicken and fish and whole grains.  - avoid  fried foods, fast food, processed foods, sodas, red meet and other fattening foods.  - get a least 150 minutes of aerobic exercise per week.   -We have ordered labs or studies at this visit. It can take up to 1-2 weeks for results and processing. We will contact you with instructions IF your results are abnormal. Normal results will be released to your [facility]. If you have not heard from [place] or can not find your results in [facility] in 2 weeks please contact our office.   Follow up in 3-4 months           KIM, HANNAH R.

## 2013-02-20 MED ORDER — PRAVASTATIN SODIUM 40 MG PO TABS: 40.0000 mg | ORAL_TABLET | Freq: Every day | ORAL | Status: DC

## 2013-02-20 NOTE — Progress Notes (Signed)
Quick Note:  Called and spoke with pt and pt is aware. rx sent to pharmacy. Pt has a follow up visit on 03/23/2013 ______

## 2013-02-20 NOTE — Addendum Note (Signed)
Addended by: Azucena Freed on: 02/20/2013 03:45 PM   Modules accepted: Orders, Medications

## 2013-03-23 ENCOUNTER — Encounter: Payer: 59 | Admitting: Family Medicine

## 2013-03-23 NOTE — Progress Notes (Signed)
Error   This encounter was created in error - please disregard. 

## 2013-04-16 ENCOUNTER — Other Ambulatory Visit: Payer: Self-pay | Admitting: Family Medicine

## 2013-05-10 NOTE — Progress Notes (Signed)
Received fax from [facility] about the following focus/barries for pt:  Seen for type 2 DM self management.  Pt has poor recall of dietary instructions she has received and she has not been exercising. Pt's goals are to [facility] FOR 1:1 WITH rd, check CBF 1-2 times a week ac and PP, go to yoga 2 x week and walk other days, considering joining weight watchers.

## 2013-05-22 ENCOUNTER — Ambulatory Visit: Payer: 59 | Admitting: Family Medicine

## 2013-06-01 ENCOUNTER — Encounter: Payer: 59 | Admitting: Family Medicine

## 2013-06-01 NOTE — Progress Notes (Signed)
Error   This encounter was created in error - please disregard. 

## 2013-06-07 ENCOUNTER — Ambulatory Visit (INDEPENDENT_AMBULATORY_CARE_PROVIDER_SITE_OTHER): Payer: 59 | Admitting: Family Medicine

## 2013-06-07 VITALS — BP 108/70 | Temp 98.3°F | Wt 213.0 lb

## 2013-06-07 DIAGNOSIS — E119 Type 2 diabetes mellitus without complications: Secondary | ICD-10-CM

## 2013-06-07 DIAGNOSIS — E785 Hyperlipidemia, unspecified: Secondary | ICD-10-CM

## 2013-06-07 LAB — LIPID PANEL
Cholesterol: 175 mg/dL (ref 0–200)
HDL: 50.4 mg/dL (ref >39.00)
LDL Cholesterol: 89 mg/dL (ref 0–99)
Total CHOL/HDL Ratio: 3
Triglycerides: 180 mg/dL — ABNORMAL HIGH (ref 0.0–149.0)
VLDL: 36 mg/dL (ref 0.0–40.0)

## 2013-06-07 LAB — BASIC METABOLIC PANEL
BUN: 15 mg/dL (ref 6–23)
CALCIUM: 9.5 mg/dL (ref 8.4–10.5)
CO2: 27 meq/L (ref 19–32)
CREATININE: 1 mg/dL (ref 0.4–1.2)
Chloride: 105 mEq/L (ref 96–112)
GFR: 77.47 mL/min (ref >60.00)
Glucose, Bld: 173 mg/dL — ABNORMAL HIGH (ref 70–99)
Potassium: 3.2 mEq/L — ABNORMAL LOW (ref 3.5–5.1)
SODIUM: 138 meq/L (ref 135–145)

## 2013-06-07 LAB — MICROALBUMIN / CREATININE URINE RATIO
CREATININE, U: 239.6 mg/dL
Microalb Creat Ratio: 0.6 mg/g (ref 0.0–30.0)
Microalb, Ur: 1.5 mg/dL (ref 0.0–1.9)

## 2013-06-07 LAB — HEMOGLOBIN A1C: HEMOGLOBIN A1C: 7.8 % — AB (ref 4.6–6.5)

## 2013-06-07 MED ORDER — PRAVASTATIN SODIUM 20 MG PO TABS
40.0000 mg | ORAL_TABLET | Freq: Every day | ORAL | Status: DC
Start: 2013-06-07 — End: 2015-01-02

## 2013-06-07 NOTE — Progress Notes (Addendum)
Chief Complaint  Patient presents with  . Follow-up    HPI:  Follow up labs:  DM: Lab Results  Component Value Date   HGBA1C 7.1* 02/19/2013  -here to discuss tx but reports HgbA1c checked in feb with her [place] per her report and 6.4 -home blood sugars: postprandial 120-150 -advised she see diabetes educator -diet and exercise: no regular exercise -eye exam: no   HLD: Lab Results  Component Value Date   CHOL 208* 02/19/2013   HDL 54.70 02/19/2013   LDLDIRECT 140.3 02/19/2013   TRIG 123.0 02/19/2013   CHOLHDL 4 02/19/2013  -started pravastatin  ROS: See pertinent positives and negatives per HPI.  Past Medical History  Diagnosis Date  . Diabetes mellitus   . Hypertension   . Blood glucose elevated   . Elevated cholesterol     Past Surgical History  Procedure Laterality Date  . Abdominal hysterectomy      Family History  Problem Relation Age of Onset  . Diabetes Mother   . Congestive Heart Failure Mother     History   Social History  . Marital Status: Divorced    Spouse Name: N/A    Number of Children: N/A  . Years of Education: N/A   Social History Main Topics  . Smoking status: Never Smoker   . Smokeless tobacco: Not on file  . Alcohol Use: No  . Drug Use: No  . Sexual Activity:    Other Topics Concern  . Not on file   Social History Narrative  . No narrative on file    Current outpatient prescriptions:Alcohol Swabs PADS, Use as directed., Disp: 100 each, Rfl: 12;  aspirin 81 MG tablet, Take 81 mg by mouth every other day. , Disp: , Rfl: ;  fish oil-omega-3 fatty acids 1000 MG capsule, Take 2 g by mouth every other day., Disp: , Rfl: ;  glucose blood (TRUETEST TEST) test strip, Test twice weekly., Disp: 100 each, Rfl: 12 lisinopril (PRINIVIL,ZESTRIL) 20 MG tablet, TAKE 1 TABLET BY MOUTH ONCE DAILY, Disp: 90 tablet, Rfl: 3;  loratadine (CLARITIN) 10 MG tablet, Take 10 mg by mouth daily., Disp: , Rfl: ;  Multiple Vitamin (MULTIVITAMIN)  capsule, Take 1 capsule by mouth daily., Disp: , Rfl: ;  pravastatin (PRAVACHOL) 20 MG tablet, Take 2 tablets (40 mg total) by mouth daily., Disp: 90 tablet, Rfl: 3 triamterene-hydrochlorothiazide (MAXZIDE-25) 37.5-25 MG per tablet, TAKE 1 TABLET BY MOUTH DAILY, Disp: 90 tablet, Rfl: 3;  TRUEPLUS SAFETY LANCETS 28G MISC, 1 Device by Does not apply route 2 (two) times a week., Disp: 100 each, Rfl: 12  EXAM:  Filed Vitals:   06/07/13 1305  BP: 108/70  Temp: 98.3 F (36.8 C)    Body mass index is 37.74 kg/(m^2).  GENERAL: vitals reviewed and listed above, alert, oriented, appears well hydrated and in no acute distress  HEENT: atraumatic, conjunttiva clear, no obvious abnormalities on inspection of external nose and ears  NECK: no obvious masses on inspection  LUNGS: clear to auscultation bilaterally, no wheezes, rales or rhonchi, good air movement  CV: HRRR, no peripheral edema  MS: moves all extremities without noticeable abnormality  PSYCH: pleasant and cooperative, no obvious depression or anxiety  ASSESSMENT AND PLAN:  Discussed the following assessment and plan:  Hyperlipidemia - Plan: pravastatin (PRAVACHOL) 20 MG tablet, Lipid Panel  Diabetes - Plan: Hemoglobin L9J, Basic metabolic panel, Microalbumin/Creatinine Ratio, Urine  -recheck labs - NON FASTING today -discussed options for tx if hgbA1c >7 -continue  statin she prefers to take 2 20mg  tabs due to size - rx sent -she declined nutrition referral -advised on lifestyle recs -Patient advised to return or notify a doctor immediately if symptoms worsen or persist or new concerns arise.  Patient Instructions  -eye exam yearly  -We recommend the following healthy lifestyle measures: - eat a healthy diet consisting of lots of vegetables, fruits, beans, nuts, seeds, healthy meats such as white chicken and fish and whole grains.  - avoid fried foods, fast food, processed foods, sodas, red meet and other fattening foods.   - get a least 150 minutes of aerobic exercise per week.   -follow up in 3 months     Jessabelle Markiewicz R.

## 2013-06-07 NOTE — Progress Notes (Signed)
Pre visit review using our clinic review tool, if applicable. No additional management support is needed unless otherwise documented below in the visit note. 

## 2013-06-07 NOTE — Patient Instructions (Addendum)
-  eye exam yearly  -We recommend the following healthy lifestyle measures: - eat a healthy diet consisting of lots of vegetables, fruits, beans, nuts, seeds, healthy meats such as white chicken and fish and whole grains.  - avoid fried foods, fast food, processed foods, sodas, red meet and other fattening foods.  - get a least 150 minutes of aerobic exercise per week.   -follow up in 3 months

## 2013-06-08 ENCOUNTER — Telehealth: Payer: Self-pay

## 2013-06-08 MED ORDER — METFORMIN HCL 500 MG PO TABS: ORAL_TABLET | ORAL | Status: DC

## 2013-06-08 NOTE — Addendum Note (Signed)
Addended by: Colleen Can on: 06/08/2013 03:15 PM   Modules accepted: Orders

## 2013-06-08 NOTE — Telephone Encounter (Signed)
Relevant patient education assigned to patient using Emmi. ° °

## 2013-07-24 ENCOUNTER — Encounter: Payer: Self-pay | Admitting: Family Medicine

## 2013-07-24 ENCOUNTER — Telehealth: Payer: Self-pay | Admitting: Family Medicine

## 2013-07-24 ENCOUNTER — Ambulatory Visit (HOSPITAL_COMMUNITY): Payer: 59 | Attending: Family Medicine | Admitting: Cardiology

## 2013-07-24 ENCOUNTER — Ambulatory Visit (INDEPENDENT_AMBULATORY_CARE_PROVIDER_SITE_OTHER): Payer: 59 | Admitting: Family Medicine

## 2013-07-24 ENCOUNTER — Encounter (HOSPITAL_COMMUNITY): Payer: Self-pay | Admitting: Cardiology

## 2013-07-24 VITALS — BP 132/82 | HR 88 | Temp 98.6°F | Wt 206.0 lb

## 2013-07-24 DIAGNOSIS — M79609 Pain in unspecified limb: Secondary | ICD-10-CM

## 2013-07-24 DIAGNOSIS — M7989 Other specified soft tissue disorders: Secondary | ICD-10-CM | POA: Insufficient documentation

## 2013-07-24 DIAGNOSIS — M79661 Pain in right lower leg: Secondary | ICD-10-CM

## 2013-07-24 NOTE — Telephone Encounter (Signed)
[phone] (home)  Let her know.

## 2013-07-24 NOTE — Progress Notes (Signed)
Chief Complaint  Patient presents with  . right leg swelling and painful  . Diarrhea    nasuea, chills, fever are resloving but BP is lower this morning and pt has not taken bp medication     HPI:  Acute visit for:  1)Diarrhea, nausea: -started 4 days ago and now getting better - diarrhea is tapering off, no diarrhea today -watery diarrhea, multiple bouts, nausea, low grade fevers -denies: blood in stools, vomiting, inability to tolerate fluids  2)R leg pain: -started about 1 week ago -in calf - has been walking daily -chronic bilat LE edema, reports not more swollen then usual   ROS: See pertinent positives and negatives per HPI.  Past Medical History  Diagnosis Date  . Diabetes mellitus   . Hypertension   . Blood glucose elevated   . Elevated cholesterol     Past Surgical History  Procedure Laterality Date  . Abdominal hysterectomy      Family History  Problem Relation Age of Onset  . Diabetes Mother   . Congestive Heart Failure Mother     History   Social History  . Marital Status: Divorced    Spouse Name: N/A    Number of Children: N/A  . Years of Education: N/A   Social History Main Topics  . Smoking status: Never Smoker   . Smokeless tobacco: None  . Alcohol Use: No  . Drug Use: No  . Sexual Activity:    Other Topics Concern  . None   Social History Narrative  . None    Current outpatient prescriptions:Alcohol Swabs PADS, Use as directed., Disp: 100 each, Rfl: 12;  aspirin 81 MG tablet, Take 81 mg by mouth every other day. , Disp: , Rfl: ;  fish oil-omega-3 fatty acids 1000 MG capsule, Take 2 g by mouth every other day., Disp: , Rfl: ;  glucose blood (TRUETEST TEST) test strip, Test twice weekly., Disp: 100 each, Rfl: 12 lisinopril (PRINIVIL,ZESTRIL) 20 MG tablet, TAKE 1 TABLET BY MOUTH ONCE DAILY, Disp: 90 tablet, Rfl: 3;  loratadine (CLARITIN) 10 MG tablet, Take 10 mg by mouth daily., Disp: , Rfl: ;  metFORMIN (GLUCOPHAGE) 500 MG tablet, Take  500 mg daily for 1 week then increase to 500 mg bid., Disp: 60 tablet, Rfl: 2;  Multiple Vitamin (MULTIVITAMIN) capsule, Take 1 capsule by mouth daily., Disp: , Rfl:  pravastatin (PRAVACHOL) 20 MG tablet, Take 2 tablets (40 mg total) by mouth daily., Disp: 90 tablet, Rfl: 3;  triamterene-hydrochlorothiazide (MAXZIDE-25) 37.5-25 MG per tablet, TAKE 1 TABLET BY MOUTH DAILY, Disp: 90 tablet, Rfl: 3;  TRUEPLUS SAFETY LANCETS 28G MISC, 1 Device by Does not apply route 2 (two) times a week., Disp: 100 each, Rfl: 12  EXAM:  Filed Vitals:   07/24/13 0921  BP: 132/82  Pulse: 88  Temp: 98.6 F (37 C)    There is no weight on file to calculate BMI.  GENERAL: vitals reviewed and listed above, alert, oriented, appears well hydrated and in no acute distress  HEENT: atraumatic, conjunttiva clear, no obvious abnormalities on inspection of external nose and ears  NECK: no obvious masses on inspection  LUNGS: clear to auscultation bilaterally, no wheezes, rales or rhonchi, good air movement  CV: HRRR, no peripheral edema  ABD: BS+, soft, NTTP  MS: moves all extremities without noticeable abnormality  PSYCH: pleasant and cooperative, no obvious depression or anxiety  ASSESSMENT AND PLAN:  Discussed the following assessment and plan:  Right calf pain - Plan: Lower  Extremity Venous Duplex Right, CANCELED: Lower Extremity Venous Duplex Right  -Patient advised to return or notify a doctor immediately if symptoms worsen or persist or new concerns arise.  There are no Patient Instructions on file for this visit.   Lucretia Kern

## 2013-07-24 NOTE — Patient Instructions (Signed)
Go get the ultrasound of your leg  Viral Gastroenteritis Viral gastroenteritis is also known as stomach flu. This condition affects the stomach and intestinal tract. It can cause sudden diarrhea and vomiting. The illness typically lasts 3 to 8 days. Most people develop an immune response that eventually gets rid of the virus. While this natural response develops, the virus can make you quite ill. CAUSES  Many different viruses can cause gastroenteritis, such as rotavirus or noroviruses. You can catch one of these viruses by consuming contaminated food or water. You may also catch a virus by sharing utensils or other personal items with an infected person or by touching a contaminated surface. SYMPTOMS  The most common symptoms are diarrhea and vomiting. These problems can cause a severe loss of body fluids (dehydration) and a body salt (electrolyte) imbalance. Other symptoms may include:  Fever.  Headache.  Fatigue.  Abdominal pain. DIAGNOSIS  Your caregiver can usually diagnose viral gastroenteritis based on your symptoms and a physical exam. A stool sample may also be taken to test for the presence of viruses or other infections. TREATMENT  This illness typically goes away on its own. Treatments are aimed at rehydration. The most serious cases of viral gastroenteritis involve vomiting so severely that you are not able to keep fluids down. In these cases, fluids must be given through an intravenous line (IV). HOME CARE INSTRUCTIONS   Drink enough fluids to keep your urine clear or pale yellow. Drink small amounts of fluids frequently and increase the amounts as tolerated.  Ask your caregiver for specific rehydration instructions.  Avoid:  Foods high in sugar.  Alcohol.  Carbonated drinks.  Tobacco.  Juice.  Caffeine drinks.  Extremely hot or cold fluids.  Fatty, greasy foods.  Too much intake of anything at one time.  Dairy products until 24 to 48 hours after diarrhea  stops.  You may consume probiotics. Probiotics are active cultures of beneficial bacteria. They may lessen the amount and number of diarrheal stools in adults. Probiotics can be found in yogurt with active cultures and in supplements.  Wash your hands well to avoid spreading the virus.  Only take over-the-counter or prescription medicines for pain, discomfort, or fever as directed by your caregiver. Do not give aspirin to children. Antidiarrheal medicines are not recommended.  Ask your caregiver if you should continue to take your regular prescribed and over-the-counter medicines.  Keep all follow-up appointments as directed by your caregiver. SEEK IMMEDIATE MEDICAL CARE IF:   You are unable to keep fluids down.  You do not urinate at least once every 6 to 8 hours.  You develop shortness of breath.  You notice blood in your stool or vomit. This may look like coffee grounds.  You have abdominal pain that increases or is concentrated in one small area (localized).  You have persistent vomiting or diarrhea.  You have a fever.  The patient is a child younger than 3 months, and he or she has a fever.  The patient is a child older than 3 months, and he or she has a fever and persistent symptoms.  The patient is a child older than 3 months, and he or she has a fever and symptoms suddenly get worse.  The patient is a baby, and he or she has no tears when crying. MAKE SURE YOU:   Understand these instructions.  Will watch your condition.  Will get help right away if you are not doing well or get worse. Document  Released: 03/22/2005 Document Revised: 06/14/2011 Document Reviewed: 01/06/2011 [facility] Patient Information 2014 [place], [place].

## 2013-07-24 NOTE — Telephone Encounter (Signed)
Message copied by Lucretia Kern on Tue Jul 24, 2013  4:15 PM ------      Message from: Illene Silver      Created: Tue Jul 24, 2013 11:11 AM      Regarding: prelim       Right leg lower venous duplex appears negative for acute thrombus.       Thank you  ------

## 2013-07-24 NOTE — Progress Notes (Signed)
Pre visit review using our clinic review tool, if applicable. No additional management support is needed unless otherwise documented below in the visit note. 

## 2013-07-24 NOTE — Progress Notes (Signed)
Lower venous duplex limited completed 

## 2013-08-17 ENCOUNTER — Encounter: Payer: Self-pay | Admitting: *Deleted

## 2013-09-02 LAB — HM DIABETES EYE EXAM

## 2013-10-29 ENCOUNTER — Other Ambulatory Visit: Payer: Self-pay | Admitting: Family Medicine

## 2013-10-30 NOTE — Telephone Encounter (Signed)
Dx 250.00

## 2013-11-20 ENCOUNTER — Other Ambulatory Visit: Payer: Self-pay | Admitting: Family Medicine

## 2013-11-29 ENCOUNTER — Telehealth: Payer: Self-pay | Admitting: Family Medicine

## 2013-11-29 MED ORDER — GLUCOSE BLOOD VI STRP: ORAL_STRIP | Status: DC

## 2013-11-29 NOTE — Telephone Encounter (Signed)
Rx sent to [facility]  

## 2013-11-29 NOTE — Telephone Encounter (Signed)
Pt states she is completely out of her test strips TRUETEST TEST test strip, please send to [facility] on [address].

## 2013-11-29 NOTE — Telephone Encounter (Signed)
Error/gd °

## 2013-12-03 ENCOUNTER — Ambulatory Visit (INDEPENDENT_AMBULATORY_CARE_PROVIDER_SITE_OTHER): Payer: 59 | Admitting: Family Medicine

## 2013-12-03 ENCOUNTER — Encounter: Payer: Self-pay | Admitting: Family Medicine

## 2013-12-03 VITALS — BP 134/88 | HR 81 | Temp 98.2°F | Ht 63.0 in | Wt 207.5 lb

## 2013-12-03 DIAGNOSIS — L03012 Cellulitis of left finger: Secondary | ICD-10-CM

## 2013-12-03 DIAGNOSIS — E785 Hyperlipidemia, unspecified: Secondary | ICD-10-CM

## 2013-12-03 DIAGNOSIS — E1159 Type 2 diabetes mellitus with other circulatory complications: Secondary | ICD-10-CM

## 2013-12-03 DIAGNOSIS — IMO0002 Reserved for concepts with insufficient information to code with codable children: Secondary | ICD-10-CM

## 2013-12-03 DIAGNOSIS — I10 Essential (primary) hypertension: Secondary | ICD-10-CM

## 2013-12-03 LAB — MICROALBUMIN / CREATININE URINE RATIO
Creatinine,U: 122.6 mg/dL
MICROALB/CREAT RATIO: 0.2 mg/g (ref 0.0–30.0)
Microalb, Ur: 0.2 mg/dL (ref 0.0–1.9)

## 2013-12-03 LAB — BASIC METABOLIC PANEL
BUN: 13 mg/dL (ref 6–23)
CO2: 27 meq/L (ref 19–32)
Calcium: 9.3 mg/dL (ref 8.4–10.5)
Chloride: 104 mEq/L (ref 96–112)
Creatinine, Ser: 0.7 mg/dL (ref 0.4–1.2)
GFR: 113.2 mL/min (ref >60.00)
Glucose, Bld: 149 mg/dL — ABNORMAL HIGH (ref 70–99)
POTASSIUM: 3.5 meq/L (ref 3.5–5.1)
SODIUM: 139 meq/L (ref 135–145)

## 2013-12-03 LAB — LIPID PANEL
CHOLESTEROL: 166 mg/dL (ref 0–200)
HDL: 48 mg/dL (ref >39.00)
LDL Cholesterol: 99 mg/dL (ref 0–99)
NonHDL: 118
TRIGLYCERIDES: 97 mg/dL (ref 0.0–149.0)
Total CHOL/HDL Ratio: 3
VLDL: 19.4 mg/dL (ref 0.0–40.0)

## 2013-12-03 LAB — HEMOGLOBIN A1C: Hgb A1c MFr Bld: 7.3 % — ABNORMAL HIGH (ref 4.6–6.5)

## 2013-12-03 NOTE — Progress Notes (Signed)
No chief complaint on file.   HPI:  Follow up:  DM: -meds: metformin 1000mg  bid, asa, acei -last eye exam: hqd recently per her report -diet and exercise: not doing much -denies: polyuria, polydipsia, wounds, vision changes Lab Results  Component Value Date   HGBA1C 7.8* 06/07/2013    HTN: -meds: triamterene-hctz, lisinopril -denies: CP, SOB, swelling  HLD: -meds include pravastatin -denies: leg cramps, cognitive changes Lab Results  Component Value Date   CHOL 175 06/07/2013   HDL 50.40 06/07/2013   LDLCALC 89 06/07/2013   LDLDIRECT 140.3 02/19/2013   TRIG 180.0* 06/07/2013   CHOLHDL 3 06/07/2013    ROS: See pertinent positives and negatives per HPI.  Past Medical History  Diagnosis Date  . Diabetes mellitus   . Hypertension   . Blood glucose elevated   . Elevated cholesterol     Past Surgical History  Procedure Laterality Date  . Abdominal hysterectomy      Family History  Problem Relation Age of Onset  . Diabetes Mother   . Congestive Heart Failure Mother     History   Social History  . Marital Status: Divorced    Spouse Name: N/A    Number of Children: N/A  . Years of Education: N/A   Social History Main Topics  . Smoking status: Never Smoker   . Smokeless tobacco: None  . Alcohol Use: No  . Drug Use: No  . Sexual Activity:    Other Topics Concern  . None   Social History Narrative  . None    Current outpatient prescriptions:Alcohol Swabs PADS, Use as directed., Disp: 100 each, Rfl: 12;  glucose blood (TRUETEST TEST) test strip, Use as instructed to check blood sugar twice a week, Disp: 100 each, Rfl: 1;  lisinopril (PRINIVIL,ZESTRIL) 20 MG tablet, TAKE 1 TABLET BY MOUTH ONCE DAILY, Disp: 90 tablet, Rfl: 3 metFORMIN (GLUCOPHAGE) 500 MG tablet, TAKE 1 TABLET BY MOUTH ONCE DAILY FOR 1 WEEK, THEN INCREASE TO 1 TABLET TWICE DAILY, Disp: 60 tablet, Rfl: 1;  Multiple Vitamin (MULTIVITAMIN) capsule, Take 1 capsule by mouth daily., Disp: , Rfl: ;   pravastatin (PRAVACHOL) 20 MG tablet, Take 2 tablets (40 mg total) by mouth daily., Disp: 90 tablet, Rfl: 3 triamterene-hydrochlorothiazide (MAXZIDE-25) 37.5-25 MG per tablet, TAKE 1 TABLET BY MOUTH DAILY, Disp: 90 tablet, Rfl: 3;  TRUEPLUS LANCETS 30G MISC, USE AS DIRECTED 2 TIMES A WEEK, Disp: 100 each, Rfl: 3;  TRUETEST TEST test strip, TEST TWICE WEEKLY., Disp: 100 each, Rfl: 3  EXAM:  Filed Vitals:   12/03/13 0924  BP: 134/88  Pulse: 81  Temp: 98.2 F (36.8 C)    Body mass index is 36.77 kg/(m^2).  GENERAL: vitals reviewed and listed above, alert, oriented, appears well hydrated and in no acute distress  HEENT: atraumatic, conjunttiva clear, no obvious abnormalities on inspection of external nose and ears  NECK: no obvious masses on inspection  LUNGS: clear to auscultation bilaterally, no wheezes, rales or rhonchi, good air movement  CV: HRRR, no peripheral edema  MS: moves all extremities without noticeable abnormality  PSYCH: pleasant and cooperative, no obvious depression or anxiety  FOOT EXAM: see quality metrics, small callous or wart l great toe  ASSESSMENT AND PLAN:  Discussed the following assessment and plan:  Type II or unspecified type diabetes mellitus with peripheral circulatory disorders, uncontrolled(250.72) - Plan: Hemoglobin A1C, Microalbumin/Creatinine Ratio, Urine  Hyperlipidemia - Plan: Lipid Panel  Essential hypertension - Plan: Basic metabolic panel  Paronychia, left  -  pneuumococcal, flu offered - she declined pneumonia, she reports she will get flu -foot exam: paronychia or callous - offered referral to podiatry and discussed potential etiologist - she wants to do sitz baths and call in 2 weeks if not resolved -FASTING labs -Patient advised to return or notify a doctor immediately if symptoms worsen or persist or new concerns arise.  Patient Instructions  We recommend the following healthy lifestyle measures: - eat a healthy diet  consisting of lots of vegetables, fruits, beans, nuts, seeds, healthy meats such as white chicken and fish and whole grains.  - avoid fried foods, fast food, processed foods, sodas, red meet and other fattening foods.  - get a least 150 minutes of aerobic exercise per week.   -warm salt water soaks and antibacterial ointment to toe - call in 2 weeks if not resolved to refer to foot doctor  -We have ordered labs or studies at this visit. It can take up to 1-2 weeks for results and processing. We will contact you with instructions IF your results are abnormal. Normal results will be released to your [facility]. If you have not heard from [place] or can not find your results in [facility] in 2 weeks please contact our office.            Colin Benton R.

## 2013-12-03 NOTE — Progress Notes (Signed)
Pre visit review using our clinic review tool, if applicable. No additional management support is needed unless otherwise documented below in the visit note. 

## 2013-12-03 NOTE — Patient Instructions (Signed)
We recommend the following healthy lifestyle measures: - eat a healthy diet consisting of lots of vegetables, fruits, beans, nuts, seeds, healthy meats such as white chicken and fish and whole grains.  - avoid fried foods, fast food, processed foods, sodas, red meet and other fattening foods.  - get a least 150 minutes of aerobic exercise per week.   -warm salt water soaks and antibacterial ointment to toe - call in 2 weeks if not resolved to refer to foot doctor  -We have ordered labs or studies at this visit. It can take up to 1-2 weeks for results and processing. We will contact you with instructions IF your results are abnormal. Normal results will be released to your [facility]. If you have not heard from [place] or can not find your results in [facility] in 2 weeks please contact our office.

## 2013-12-12 ENCOUNTER — Telehealth: Payer: Self-pay | Admitting: *Deleted

## 2013-12-12 MED ORDER — METFORMIN HCL 1000 MG PO TABS: 1000.0000 mg | ORAL_TABLET | Freq: Two times a day (BID) | ORAL | Status: DC

## 2013-12-12 NOTE — Addendum Note (Signed)
Addended by: Agnes Lawrence on: 12/12/2013 12:58 PM   Modules accepted: Orders

## 2013-12-12 NOTE — Telephone Encounter (Signed)
Patient called for lab test results and complains of low back pain, mild diarrea and abdominal pain since Monday.  States she suspected this was due to food she ate at a funeral but is not sure.  She denies any fever, chills or difficulty urinating and stated she can only come in late on Friday and I scheduled an appt.

## 2013-12-14 ENCOUNTER — Ambulatory Visit: Payer: 59 | Admitting: Family Medicine

## 2013-12-14 ENCOUNTER — Ambulatory Visit (INDEPENDENT_AMBULATORY_CARE_PROVIDER_SITE_OTHER): Payer: 59 | Admitting: Family Medicine

## 2013-12-14 ENCOUNTER — Encounter: Payer: Self-pay | Admitting: Family Medicine

## 2013-12-14 VITALS — BP 128/84 | HR 83 | Temp 98.0°F | Ht 63.0 in | Wt 203.0 lb

## 2013-12-14 DIAGNOSIS — E119 Type 2 diabetes mellitus without complications: Secondary | ICD-10-CM

## 2013-12-14 DIAGNOSIS — I10 Essential (primary) hypertension: Secondary | ICD-10-CM

## 2013-12-14 DIAGNOSIS — E785 Hyperlipidemia, unspecified: Secondary | ICD-10-CM

## 2013-12-14 DIAGNOSIS — M25551 Pain in right hip: Secondary | ICD-10-CM

## 2013-12-14 DIAGNOSIS — M25559 Pain in unspecified hip: Secondary | ICD-10-CM

## 2013-12-14 DIAGNOSIS — R197 Diarrhea, unspecified: Secondary | ICD-10-CM

## 2013-12-14 NOTE — Progress Notes (Signed)
No chief complaint on file.   HPI:  Acute visit for:  1) Hip pain: -started 4 days ago -after long care ride -pain is in R sacral area, intermittent, worse with certain movements - like a pull, 0-mild/10 with stretching or certain movement. and bottock and radiates to lat hip -denies: fevers, dysuria, hematuria, constipation, falls, trauma, leg pain/weakness or numbness  2)Loose stool: -a few times in the early week, resolving -denies: fevers, bloody stool, abd pain, vaginal bleeding  3) wants to go over labs for diabetes: -has increased the metformin -diet and exercise advised     ROS: See pertinent positives and negatives per HPI.  Past Medical History  Diagnosis Date  . Diabetes mellitus   . Hypertension   . Blood glucose elevated   . Elevated cholesterol     Past Surgical History  Procedure Laterality Date  . Abdominal hysterectomy      Family History  Problem Relation Age of Onset  . Diabetes Mother   . Congestive Heart Failure Mother     History   Social History  . Marital Status: Divorced    Spouse Name: N/A    Number of Children: N/A  . Years of Education: N/A   Social History Main Topics  . Smoking status: Never Smoker   . Smokeless tobacco: None  . Alcohol Use: No  . Drug Use: No  . Sexual Activity:    Other Topics Concern  . None   Social History Narrative  . None    Current outpatient prescriptions:Alcohol Swabs PADS, Use as directed., Disp: 100 each, Rfl: 12;  glucose blood (TRUETEST TEST) test strip, Use as instructed to check blood sugar twice a week, Disp: 100 each, Rfl: 1;  lisinopril (PRINIVIL,ZESTRIL) 20 MG tablet, TAKE 1 TABLET BY MOUTH ONCE DAILY, Disp: 90 tablet, Rfl: 3 metFORMIN (GLUCOPHAGE) 1000 MG tablet, Take 1 tablet (1,000 mg total) by mouth 2 (two) times daily with a meal., Disp: 60 tablet, Rfl: 1;  Multiple Vitamin (MULTIVITAMIN) capsule, Take 1 capsule by mouth daily., Disp: , Rfl: ;  pravastatin (PRAVACHOL) 20 MG  tablet, Take 2 tablets (40 mg total) by mouth daily., Disp: 90 tablet, Rfl: 3 triamterene-hydrochlorothiazide (MAXZIDE-25) 37.5-25 MG per tablet, TAKE 1 TABLET BY MOUTH DAILY, Disp: 90 tablet, Rfl: 3;  TRUEPLUS LANCETS 30G MISC, USE AS DIRECTED 2 TIMES A WEEK, Disp: 100 each, Rfl: 3;  TRUETEST TEST test strip, TEST TWICE WEEKLY., Disp: 100 each, Rfl: 3  EXAM:  Filed Vitals:   12/14/13 0934  BP: 128/84  Pulse: 83  Temp: 98 F (36.7 C)    Body mass index is 35.97 kg/(m^2).  GENERAL: vitals reviewed and listed above, alert, oriented, appears well hydrated and in no acute distress  HEENT: atraumatic, conjunttiva clear, no obvious abnormalities on inspection of external nose and ears  NECK: no obvious masses on inspection  LUNGS: clear to auscultation bilaterally, no wheezes, rales or rhonchi, good air movement  CV: HRRR, no peripheral edema  MS: moves all extremities without noticeable abnormality -TTP over GM muscle on R -Normal Gait Normal inspection of back, no obvious scoliosis or leg length descrepancy No bony TTP Soft tissue TTP at: -/+ tests: neg trendelenburg,-facet loading, -SLRT, -CLRT, -FABER, -FADIR Normal muscle strength, sensation to light touch and DTRs in LEs bilaterally -other wise normal back and LE exam and hip exam  ABD: BS+, soft, NTTP  PSYCH: pleasant and cooperative, no obvious depression or anxiety  ASSESSMENT AND PLAN:  Discussed the following assessment  and plan:  1. Hip pain, right -with focal TTP in gluteus muscle suspect strain most likely -discussed other potential etiologies and return precautions -HEP, conservative care, return in 3-4 weeks  2. Diarrhea -resolving, follow up if persists, maybe related to new dose metformin  3. Type 2 diabetes mellitus without complication -reviewed labs, metformin 1000mg  bid and healthy diet and regular exercise -copy of labs given to patient  4. Essential hypertension -continue current tx, reviewed  labs  5. Hyperlipidemia -continue current treatment, reviewed labs  -Patient advised to return or notify a doctor immediately if symptoms worsen or persist or new concerns arise.  Patient Instructions  -heat for 15 minutes twice daily over R upper buttock  -can try topical sports creams with menthol or capsaicin - available over the counter  -exercises provided at least 4 days per week  -tylenol 500-1000mg  up to 3 times per day only IF NEEDED for pain  -follow pu in 3-4 weeks or sooner if any worsening or concerns     Laura Chisenhall R.

## 2013-12-14 NOTE — Patient Instructions (Signed)
-  heat for 15 minutes twice daily over R upper buttock  -can try topical sports creams with menthol or capsaicin - available over the counter  -exercises provided at least 4 days per week  -tylenol 500-1000mg  up to 3 times per day only IF NEEDED for pain  -follow pu in 3-4 weeks or sooner if any worsening or concerns

## 2013-12-14 NOTE — Progress Notes (Signed)
Pre visit review using our clinic review tool, if applicable. No additional management support is needed unless otherwise documented below in the visit note. 

## 2014-01-04 ENCOUNTER — Encounter: Payer: 59 | Admitting: Family Medicine

## 2014-01-04 DIAGNOSIS — Z0289 Encounter for other administrative examinations: Secondary | ICD-10-CM

## 2014-01-04 NOTE — Progress Notes (Signed)
Error-no show       This encounter was created in error - please disregard.

## 2014-01-29 LAB — HM COLONOSCOPY

## 2014-02-01 ENCOUNTER — Encounter: Payer: Self-pay | Admitting: Family Medicine

## 2014-05-10 ENCOUNTER — Other Ambulatory Visit: Payer: Self-pay | Admitting: Family Medicine

## 2014-06-07 ENCOUNTER — Other Ambulatory Visit: Payer: Self-pay | Admitting: Family Medicine

## 2014-06-07 ENCOUNTER — Telehealth: Payer: Self-pay | Admitting: Family Medicine

## 2014-06-07 LAB — HM DIABETES EYE EXAM

## 2014-06-07 MED ORDER — LISINOPRIL 20 MG PO TABS: 20.0000 mg | ORAL_TABLET | Freq: Every day | ORAL | Status: DC

## 2014-06-07 NOTE — Telephone Encounter (Signed)
Rx sent to pharmacy. Pt has upcoming appt this month.

## 2014-06-07 NOTE — Telephone Encounter (Signed)
Pt is out of lisinopril 20 mg #90 w/refills sent to [facility]

## 2014-06-07 NOTE — Telephone Encounter (Signed)
Rx sent 

## 2014-06-11 ENCOUNTER — Encounter: Payer: Self-pay | Admitting: Family Medicine

## 2014-06-14 ENCOUNTER — Encounter: Payer: Self-pay | Admitting: Family Medicine

## 2014-06-14 ENCOUNTER — Ambulatory Visit (INDEPENDENT_AMBULATORY_CARE_PROVIDER_SITE_OTHER): Payer: 59 | Admitting: Family Medicine

## 2014-06-14 VITALS — BP 130/90 | HR 100 | Temp 98.2°F | Ht 63.0 in | Wt 208.1 lb

## 2014-06-14 DIAGNOSIS — M25531 Pain in right wrist: Secondary | ICD-10-CM | POA: Diagnosis not present

## 2014-06-14 DIAGNOSIS — E119 Type 2 diabetes mellitus without complications: Secondary | ICD-10-CM

## 2014-06-14 DIAGNOSIS — M25532 Pain in left wrist: Secondary | ICD-10-CM | POA: Diagnosis not present

## 2014-06-14 DIAGNOSIS — I10 Essential (primary) hypertension: Secondary | ICD-10-CM | POA: Diagnosis not present

## 2014-06-14 DIAGNOSIS — E785 Hyperlipidemia, unspecified: Secondary | ICD-10-CM | POA: Diagnosis not present

## 2014-06-14 LAB — BASIC METABOLIC PANEL
BUN: 17 mg/dL (ref 6–23)
CO2: 30 mEq/L (ref 19–32)
Calcium: 10 mg/dL (ref 8.4–10.5)
Chloride: 104 mEq/L (ref 96–112)
Creatinine, Ser: 0.87 mg/dL (ref 0.40–1.20)
GFR: 86.47 mL/min (ref >60.00)
GLUCOSE: 229 mg/dL — AB (ref 70–99)
Potassium: 3.5 mEq/L (ref 3.5–5.1)
Sodium: 138 mEq/L (ref 135–145)

## 2014-06-14 LAB — HEMOGLOBIN A1C: Hgb A1c MFr Bld: 7.5 % — ABNORMAL HIGH (ref 4.6–6.5)

## 2014-06-14 NOTE — Progress Notes (Signed)
Pre visit review using our clinic review tool, if applicable. No additional management support is needed unless otherwise documented below in the visit note. 

## 2014-06-14 NOTE — Patient Instructions (Signed)
BEFORE YOU LEAVE: -labs -schedule fasting lab for lipids -follow up in 3 months  We recommend the following healthy lifestyle measures: - eat a healthy diet consisting of lots of vegetables, fruits, beans, nuts, seeds, healthy meats such as white chicken and fish and whole grains.  - avoid fried foods, fast food, processed foods, sodas, red meet and other fattening foods.  - get a least 150 minutes of aerobic exercise per week.   FOR YOUR DIABETES:  []  Eat a healthy low carb diet (avoid sweets, sweet drinks, breads, potatoes, rice, etc.) and ensure 3 small meals daily.  []  Get AT LEAST 150 minutes of cardiovascular exercise per week - 30 minutes per day is best of sustained sweaty exercise.  []  Take all of your medications every day as directed by your doctor. Call your doctor immediately if you have any questions about your medications or are running low.  []  Check your blood sugar often and when you feel unwell and keep a log to bring to all health appointments. FASTING: before you eat anything in the morning POSTPRANDIAL: 1-2 hours after a meal  []  If any low blood sugars < 70, eat a snack and call your doctor immediately.  []  See an eye doctor every year and fax your diabetic eye exam to our office.  Fax: [phone]  []  Take good care of your feet and keep them soft and callus free. Check your feet daily and wear comfortable shoes. See your doctor immediately if you have any cuts, calluses or wounds on your feet.

## 2014-06-14 NOTE — Progress Notes (Signed)
HPI:  Follow up:  DM: -complications:  none -meds: metformin 1000 bid -lifestyle: no regular exercise, diet is improved a little per her report -home BS: BS 75 -160 - random, she has difficulty given a bs report -last eye exam: 06/2014 -last foot exam : today -denies: hypoglycemia, wounds on feet, vision changes  HTN: -meds: lisinopril 20, triam-hctz 37.525 (on with prior PCP) -denies: CP, SOB, DOE, HA  HLD: -meds: pravastatin 20 -denies: leg cramps, cog changes  Ganglion Cysts: -reports on on R wrist and one on L -she reports the one on the L wrist that bothers her as she can feel it rub over the bone -wants treatment    ROS: See pertinent positives and negatives per HPI.  Past Medical History  Diagnosis Date  . Diabetes mellitus   . Hypertension   . Elevated cholesterol     Past Surgical History  Procedure Laterality Date  . Abdominal hysterectomy      Family History  Problem Relation Age of Onset  . Diabetes Mother   . Congestive Heart Failure Mother     History   Social History  . Marital Status: Divorced    Spouse Name: N/A  . Number of Children: N/A  . Years of Education: N/A   Social History Main Topics  . Smoking status: Never Smoker   . Smokeless tobacco: Not on file  . Alcohol Use: No  . Drug Use: No  . Sexual Activity: Not on file   Other Topics Concern  . None   Social History Narrative     Current outpatient prescriptions:  .  Alcohol Swabs PADS, Use as directed., Disp: 100 each, Rfl: 12 .  Ascorbic Acid (VITAMIN C PO), Take by mouth., Disp: , Rfl:  .  glucose blood (TRUETEST TEST) test strip, Use as instructed to check blood sugar twice a week, Disp: 100 each, Rfl: 1 .  lisinopril (PRINIVIL,ZESTRIL) 20 MG tablet, Take 1 tablet (20 mg total) by mouth daily., Disp: 90 tablet, Rfl: 0 .  metFORMIN (GLUCOPHAGE) 1000 MG tablet, TAKE 1 TABLET BY MOUTH TWICE DAILY WITH A MEAL, Disp: 60 tablet, Rfl: 0 .  Multiple Vitamin (MULTIVITAMIN)  capsule, Take 1 capsule by mouth daily., Disp: , Rfl:  .  pravastatin (PRAVACHOL) 20 MG tablet, Take 2 tablets (40 mg total) by mouth daily., Disp: 90 tablet, Rfl: 3 .  triamterene-hydrochlorothiazide (MAXZIDE-25) 37.5-25 MG per tablet, TAKE 1 TABLET BY MOUTH DAILY, Disp: 90 tablet, Rfl: 3 .  TRUEPLUS LANCETS 30G MISC, USE AS DIRECTED 2 TIMES A WEEK, Disp: 100 each, Rfl: 3 .  TRUETEST TEST test strip, TEST TWICE WEEKLY., Disp: 100 each, Rfl: 3  EXAM:  Filed Vitals:   06/14/14 1451  BP: 130/90  Pulse: 100  Temp: 98.2 F (36.8 C)    Body mass index is 36.87 kg/(m^2).  GENERAL: vitals reviewed and listed above, alert, oriented, appears well hydrated and in no acute distress  HEENT: atraumatic, conjunttiva clear, no obvious abnormalities on inspection of external nose and ears  NECK: no obvious masses on inspection  LUNGS: clear to auscultation bilaterally, no wheezes, rales or rhonchi, good air movement  CV: HRRR, no peripheral edema  MS: moves all extremities without noticeable abnormality  PSYCH: pleasant and cooperative, no obvious depression or anxiety  ASSESSMENT AND PLAN:  Discussed the following assessment and plan:  Pain in both wrists - Plan: Ambulatory referral to Sports Medicine  Type 2 diabetes mellitus without complication - Plan: Hemoglobin A1c  Essential  hypertension - Plan: Basic metabolic panel  Hyperlipidemia - Plan: Lipid Panel  -fasting labs advised: get hgba1c, bmp today, return for fasting lipids -lifestyle recs advised -advise pneumonia : declined -offered HIV screening: declined -Patient advised to return or notify a doctor immediately if symptoms worsen or persist or new concerns arise.  Patient Instructions  BEFORE YOU LEAVE: -labs -schedule fasting lab for lipids -follow up in 3 months  We recommend the following healthy lifestyle measures: - eat a healthy diet consisting of lots of vegetables, fruits, beans, nuts, seeds, healthy meats  such as white chicken and fish and whole grains.  - avoid fried foods, fast food, processed foods, sodas, red meet and other fattening foods.  - get a least 150 minutes of aerobic exercise per week.   FOR YOUR DIABETES:  []  Eat a healthy low carb diet (avoid sweets, sweet drinks, breads, potatoes, rice, etc.) and ensure 3 small meals daily.  []  Get AT LEAST 150 minutes of cardiovascular exercise per week - 30 minutes per day is best of sustained sweaty exercise.  []  Take all of your medications every day as directed by your doctor. Call your doctor immediately if you have any questions about your medications or are running low.  []  Check your blood sugar often and when you feel unwell and keep a log to bring to all health appointments. FASTING: before you eat anything in the morning POSTPRANDIAL: 1-2 hours after a meal  []  If any low blood sugars < 70, eat a snack and call your doctor immediately.  []  See an eye doctor every year and fax your diabetic eye exam to our office.  Fax: [phone]  []  Take good care of your feet and keep them soft and callus free. Check your feet daily and wear comfortable shoes. See your doctor immediately if you have any cuts, calluses or wounds on your feet.      Colin Benton R.

## 2014-06-17 ENCOUNTER — Telehealth: Payer: Self-pay | Admitting: Family Medicine

## 2014-06-17 ENCOUNTER — Other Ambulatory Visit: Payer: Self-pay | Admitting: Family Medicine

## 2014-06-17 MED ORDER — SITAGLIPTIN PHOSPHATE 100 MG PO TABS: 100.0000 mg | ORAL_TABLET | Freq: Every day | ORAL | Status: DC

## 2014-06-17 NOTE — Telephone Encounter (Signed)
emmi emailed °

## 2014-06-18 MED ORDER — SITAGLIPTIN PHOSPHATE 100 MG PO TABS: 100.0000 mg | ORAL_TABLET | Freq: Every day | ORAL | Status: DC

## 2014-06-18 NOTE — Addendum Note (Signed)
Addended by: Agnes Lawrence on: 06/18/2014 03:01 PM   Modules accepted: Orders

## 2014-06-25 ENCOUNTER — Other Ambulatory Visit: Payer: Self-pay | Admitting: Family Medicine

## 2014-06-25 ENCOUNTER — Ambulatory Visit (INDEPENDENT_AMBULATORY_CARE_PROVIDER_SITE_OTHER): Payer: 59 | Admitting: Family Medicine

## 2014-06-25 ENCOUNTER — Other Ambulatory Visit (INDEPENDENT_AMBULATORY_CARE_PROVIDER_SITE_OTHER): Payer: 59

## 2014-06-25 ENCOUNTER — Encounter: Payer: Self-pay | Admitting: Family Medicine

## 2014-06-25 VITALS — BP 118/82 | HR 100 | Ht 63.0 in | Wt 208.0 lb

## 2014-06-25 DIAGNOSIS — M25531 Pain in right wrist: Secondary | ICD-10-CM

## 2014-06-25 DIAGNOSIS — M25532 Pain in left wrist: Secondary | ICD-10-CM

## 2014-06-25 DIAGNOSIS — M654 Radial styloid tenosynovitis [de Quervain]: Secondary | ICD-10-CM

## 2014-06-25 NOTE — Progress Notes (Signed)
Pre visit review using our clinic review tool, if applicable. No additional management support is needed unless otherwise documented below in the visit note. 

## 2014-06-25 NOTE — Assessment & Plan Note (Signed)
Patient is more of a de Quervain's tenosynovitis. Patient was put in a removable thumb spica cast today that I think will be beneficial. We discussed home exercises with athletic trainer in greater detail. We discussed icing regimen. We discussed what activities to potentially avoid. Patient given topical anti-inflammatories and we discussed over-the-counter natural supplementations. Patient and will come back and see me again in 3 weeks. If continuing have pain we'll consider injection.

## 2014-06-25 NOTE — Patient Instructions (Signed)
Very nice to meet you Ice 20 minutes 2 times daily. Usually after activity and before bed. Exercises 3 times a week.  Wear brace day and night for 2 weeks then nightly for 2 weeks.  Vitamin D 2000 IU daily Pennsaid twice daily See me again in 3 weeks.

## 2014-06-25 NOTE — Progress Notes (Signed)
[facility] [place], [place] [address] Phone: [phone] Subjective:    I'm seeing this patient by the request  of:  Colin Benton R., DO   CC: Pain in both wrists left greater than right  [id number] Laura Marsh is a 59 y.o. female coming in with complaint of bilateral wrist pain. Patient states that she has had ganglion cyst diagnosed previously. Patient states the one on the left is more irritating than the one on the right. Patient denies that it stops her from doing any activity but she can feeling rubbing from time to time. Patient states that it does affects some of her daily activities such as work when she is trying to do repetitive activities.  Patient states that he can be a sharp pain with certain activities. Patient feels like it seems to swell from time to time. Patient considers the surgery ganglion cyst. Denies any numbness or tingling. Rates the severity of pain is 8 out of 10.     Past medical history, social, surgical and family history all reviewed in electronic medical record.   Review of Systems: No headache, visual changes, nausea, vomiting, diarrhea, constipation, dizziness, abdominal pain, skin rash, fevers, chills, night sweats, weight loss, swollen lymph nodes, body aches, joint swelling, muscle aches, chest pain, shortness of breath, mood changes.   Objective Blood pressure 118/82, pulse 100, height 5\' 3"  (1.6 m), weight 208 lb (94.348 kg), SpO2 97 %.  General: No apparent distress alert and oriented x3 mood and affect normal, dressed appropriately.  HEENT: Pupils equal, extraocular movements intact  Respiratory: Patient's speak in full sentences and does not appear short of breath  Cardiovascular: No lower extremity edema, non tender, no erythema  Skin: Warm dry intact with no signs of infection or rash on extremities or on axial skeleton.  Abdomen: Soft nontender  Neuro: Cranial nerves II through XII are  intact, neurovascularly intact in all extremities with 2+ DTRs and 2+ pulses.  Lymph: No lymphadenopathy of posterior or anterior cervical chain or axillae bilaterally.  Gait normal with good balance and coordination.  MSK:  Non tender with full range of motion and good stability and symmetric strength and tone of shoulders, elbows,  hip, knee and ankles bilaterally.  Wrist: Left Inspection normal with no visible erythema or swelling. ROM smooth and normal with good flexion and extension and ulnar/radial deviation that is symmetrical with opposite wrist. Palpation is normal over metacarpals, navicular, lunate, and TFCC; tendons without tenderness/ swelling No snuffbox tenderness. No tenderness over Canal of [place]. Strength 5/5 in all directions without pain. Positive Finkelstein,  tinel's and phalens. Negative Watson's test.   MSK US performed of: Left wrist This study was ordered, performed, and interpreted by Charlann Boxer D.O.  Wrist: All extensor compartments visualized and tendons patient though does have hypoechoic changes of the tendon sheath of the abductor pollicis longus. No true tear appreciated. No effusion seen. TFCC intact. Scapholunate ligament intact. Carpal tunnel visualized and median nerve area normal, flexor tendons all normal in appearance without fraying, tears, or sheath effusions. Power doppler signal normal.  IMPRESSION:  De Quervain's tenosynovitis  Procedure note 38756; 15 minutes spent for Therapeutic exercises as stated in above notes.  This included exercises focusing on stretching, strengthening, with significant focus on eccentric aspects.  Patient was showed abductor exercises as well as different range of motion exercises. We discussed possible drinking exercises with a squeezy ball. We discussed 3 sets of 12  repetitions multiple exercises that were in a handout that was given to patient today. Proper technique shown and discussed handout in great detail  with ATC.  All questions were discussed and answered.      Impression and Recommendations:     This case required medical decision making of moderate complexity.

## 2014-07-05 ENCOUNTER — Ambulatory Visit: Payer: Self-pay

## 2014-07-08 NOTE — Patient Outreach (Signed)
[facility]) Care Management  07/08/2014  Laura Marsh 1955-11-21 [id number]   Member was a no show to 07/05/14 visit.  Missed appointment letter sent. Peter Garter RN, [facility] Care Management [phone]

## 2014-07-17 ENCOUNTER — Ambulatory Visit: Payer: 59 | Admitting: Family Medicine

## 2014-07-23 ENCOUNTER — Other Ambulatory Visit: Payer: Self-pay

## 2014-07-24 ENCOUNTER — Other Ambulatory Visit: Payer: Self-pay | Admitting: Family Medicine

## 2014-07-25 ENCOUNTER — Ambulatory Visit (INDEPENDENT_AMBULATORY_CARE_PROVIDER_SITE_OTHER): Payer: 59 | Admitting: Family Medicine

## 2014-07-25 ENCOUNTER — Ambulatory Visit: Payer: Self-pay

## 2014-07-25 ENCOUNTER — Other Ambulatory Visit (INDEPENDENT_AMBULATORY_CARE_PROVIDER_SITE_OTHER): Payer: 59

## 2014-07-25 ENCOUNTER — Encounter: Payer: Self-pay | Admitting: Family Medicine

## 2014-07-25 VITALS — BP 118/84 | HR 80 | Ht 63.0 in | Wt 209.0 lb

## 2014-07-25 DIAGNOSIS — M25532 Pain in left wrist: Secondary | ICD-10-CM | POA: Diagnosis not present

## 2014-07-25 DIAGNOSIS — M654 Radial styloid tenosynovitis [de Quervain]: Secondary | ICD-10-CM | POA: Diagnosis not present

## 2014-07-25 NOTE — Patient Instructions (Signed)
Good to see you Ice is your friend Continue the exercises 3 times a week See me again in 3 weeks.

## 2014-07-25 NOTE — Progress Notes (Signed)
Pre visit review using our clinic review tool, if applicable. No additional management support is needed unless otherwise documented below in the visit note. 

## 2014-07-25 NOTE — Progress Notes (Signed)
[facility] [address], [place] [address] Phone: [phone] Subjective:    CC: Pain in both wrists left greater than right  [id number] Laura Marsh is a 59 y.o. female coming in with complaint of bilateral wrist pain. Patient was diagnosed with more of a tenosynovitis. Patient was given home exercises, bracing, and topical anti-inflammatories. Patient states she is doing somewhat better. Patient states that she is 50% better on the left thumb. States that the right thumb is starting give her some discomfort. Patient denies this is stopping her from any activity but still is significantly annoying. Patient denies any numbness or any new symptoms.     Past medical history, social, surgical and family history all reviewed in electronic medical record.   Review of Systems: No headache, visual changes, nausea, vomiting, diarrhea, constipation, dizziness, abdominal pain, skin rash, fevers, chills, night sweats, weight loss, swollen lymph nodes, body aches, joint swelling, muscle aches, chest pain, shortness of breath, mood changes.   Objective Blood pressure 118/84, pulse 80, height 5\' 3"  (1.6 m), weight 209 lb (94.802 kg), SpO2 98 %.  General: No apparent distress alert and oriented x3 mood and affect normal, dressed appropriately.  HEENT: Pupils equal, extraocular movements intact  Respiratory: Patient's speak in full sentences and does not appear short of breath  Cardiovascular: No lower extremity edema, non tender, no erythema  Skin: Warm dry intact with no signs of infection or rash on extremities or on axial skeleton.  Abdomen: Soft nontender  Neuro: Cranial nerves II through XII are intact, neurovascularly intact in all extremities with 2+ DTRs and 2+ pulses.  Lymph: No lymphadenopathy of posterior or anterior cervical chain or axillae bilaterally.  Gait normal with good balance and coordination.  MSK:  Non tender with full range of  motion and good stability and symmetric strength and tone of shoulders, elbows,  hip, knee and ankles bilaterally.  Wrist: Left Inspection normal with no visible erythema or swelling. ROM smooth and normal with good flexion and extension and ulnar/radial deviation that is symmetrical with opposite wrist. Palpation is normal over metacarpals, navicular, lunate, and TFCC; tendons without tenderness/ swelling No snuffbox tenderness. No tenderness over Canal of Guyon. Strength 5/5 in all directions without pain. Positive Finkelstein,  tinel's and phalens. Negative Watson's test. No significant change from previous exam Contralateral thumb does have a positive Finkelstein but significantly less than the left side.   MSK US performed of: Left wrist This study was ordered, performed, and interpreted by Charlann Boxer D.O.  Wrist: All extensor compartments visualized and tendons patient though does have hypoechoic changes of the tendon sheath of the abductor pollicis longus. No true tear appreciated. No effusion seen. TFCC intact. Scapholunate ligament intact. Carpal tunnel visualized and median nerve area normal, flexor tendons all normal in appearance without fraying, tears, or sheath effusions. Power doppler signal normal.  IMPRESSION:  De Quervain's tenosynovitis to present no change  Procedure: Real-time Ultrasound Guided Injection of abductor pollicis longus tendon sheath Device: GE Logiq E  Ultrasound guided injection is preferred based studies that show increased duration, increased effect, greater accuracy, decreased procedural pain, increased response rate, and decreased cost with ultrasound guided versus blind injection.  Verbal informed consent obtained.  Time-out conducted.  Noted no overlying erythema, induration, or other signs of local infection.  Skin prepped in a sterile fashion.  Local anesthesia: Topical Ethyl chloride.  With sterile technique and under real time ultrasound  guidance: With a 25-gauge  1 inch needle patient was injected with 0.5 mL of 0.5% Marcaine and 0.5 mL of Kenalog 40 mg/dL.   Completed without difficulty  Pain immediately resolved suggesting accurate placement of the medication.  Advised to call if fevers/chills, erythema, induration, drainage, or persistent bleeding.  Images permanently stored and available for review in the ultrasound unit.  Impression: Technically successful ultrasound guided injection.     Impression and Recommendations:     This case required medical decision making of moderate complexity.

## 2014-07-25 NOTE — Assessment & Plan Note (Signed)
Patient was given an injection today with near complete resolution of pain. We discussed continuing the home exercises in the icing protocol. We discussed still potential bracing at night. Patient then is going to come back and see me again in 3 weeks for further evaluation. At that time if continuing to have difficulty formal physical therapy may be necessary.

## 2014-07-26 ENCOUNTER — Other Ambulatory Visit: Payer: Self-pay | Admitting: Family Medicine

## 2014-08-16 NOTE — Patient Outreach (Signed)
[facility]) Care Management  08/16/2014  Laura Marsh May 13, 1955 [id number]  Member terminated from [facility] program as she did not show for 07/05/14 appointment and she has not responded to appointment request letter.  Termination letter sent to member. Peter Garter RN, [facility] Care Management Coordinator-[facility] Management [phone]

## 2014-09-03 ENCOUNTER — Other Ambulatory Visit: Payer: Self-pay | Admitting: Family Medicine

## 2014-09-06 DIAGNOSIS — R52 Pain, unspecified: Secondary | ICD-10-CM

## 2014-09-27 ENCOUNTER — Ambulatory Visit: Payer: 59 | Admitting: *Deleted

## 2014-09-27 ENCOUNTER — Ambulatory Visit: Payer: 59 | Admitting: Family Medicine

## 2014-10-02 ENCOUNTER — Ambulatory Visit (INDEPENDENT_AMBULATORY_CARE_PROVIDER_SITE_OTHER): Payer: 59 | Admitting: Podiatry

## 2014-10-02 ENCOUNTER — Ambulatory Visit (INDEPENDENT_AMBULATORY_CARE_PROVIDER_SITE_OTHER): Payer: 59

## 2014-10-02 ENCOUNTER — Encounter: Payer: Self-pay | Admitting: Podiatry

## 2014-10-02 VITALS — BP 130/84 | HR 93 | Temp 97.6°F | Resp 12

## 2014-10-02 DIAGNOSIS — M79673 Pain in unspecified foot: Secondary | ICD-10-CM

## 2014-10-02 DIAGNOSIS — M7661 Achilles tendinitis, right leg: Secondary | ICD-10-CM

## 2014-10-02 DIAGNOSIS — M722 Plantar fascial fibromatosis: Secondary | ICD-10-CM

## 2014-10-02 MED ORDER — MELOXICAM 15 MG PO TABS: 15.0000 mg | ORAL_TABLET | Freq: Every day | ORAL | Status: DC

## 2014-10-02 NOTE — Progress Notes (Signed)
   Subjective:    Patient ID: Laura Marsh, female    DOB: [birth year], 57 y.o.   MRN: [id number]  HPI  N- achy, sometimes sharpe L- B/L feet and ankle  D- 4 months O-intermittent C- worse A-walking,standing T- taping, stretching exercises,(did help some)   The patient presents here today with B/L feet and heel pain Patient describes bilateral heel pain right greater than left upon weight-bearing. She has tried rigid custom foot orthotics and follow uncomfortable was not able to tolerate them.  Patient denies any history of foot ulceration or claudication  Review of Systems  All other systems reviewed and are negative.      Objective:   Physical Exam  Orientated 3  Vascular: DP and PT pulses 2/4 bilaterally Capillary reflex immediate bilaterally No peripheral edema noted bilaterally  Neurological: Sensation to 10 g monofilament wire intact 5/5 bilaterally Vibratory sensation intact bilaterally Ankle reflex equal and reactive bilaterally  Dermatological: Texture and turgor within normal limits bilaterally No skin lesions noted bilaterally  Musculoskeletal: Pes planus bilaterally Palpable tenderness medial central right inferior heel and posterior right heel without any palpable lesions Palpable tenderness medial central left heel without any palpable lesions         Assessment & Plan:   Assessment: Satisfactory neurovascular status Bilateral fasciitis Achilles tendinitis right  Plan: Review the results with patient today Maintain athletic style shoes Wear fasciitis straps right and left when weightbearing Describes stretching and gave patient after visit summary for home physical therapy Also, discussed the possibility of EPAT therapy  Rx meloxicam 15 mg #30 sig one by mouth daily one refill  Reevaluate 6 weeks

## 2014-10-02 NOTE — Patient Instructions (Signed)
Plantar Fasciitis (Heel Spur Syndrome) with Rehab The plantar fascia is a fibrous, ligament-like, soft-tissue structure that spans the bottom of the foot. Plantar fasciitis is a condition that causes pain in the foot due to inflammation of the tissue. SYMPTOMS   Pain and tenderness on the underneath side of the foot.  Pain that worsens with standing or walking. CAUSES  Plantar fasciitis is caused by irritation and injury to the plantar fascia on the underneath side of the foot. Common mechanisms of injury include:  Direct trauma to bottom of the foot.  Damage to a small nerve that runs under the foot where the main fascia attaches to the heel bone.  Stress placed on the plantar fascia due to bone spurs. RISK INCREASES WITH:   Activities that place stress on the plantar fascia (running, jumping, pivoting, or cutting).  Poor strength and flexibility.  Improperly fitted shoes.  Tight calf muscles.  Flat feet.  Failure to warm-up properly before activity.  Obesity. PREVENTION  Warm up and stretch properly before activity.  Allow for adequate recovery between workouts.  Maintain physical fitness:  Strength, flexibility, and endurance.  Cardiovascular fitness.  Maintain a health body weight.  Avoid stress on the plantar fascia.  Wear properly fitted shoes, including arch supports for individuals who have flat feet. PROGNOSIS  If treated properly, then the symptoms of plantar fasciitis usually resolve without surgery. However, occasionally surgery is necessary. RELATED COMPLICATIONS   Recurrent symptoms that may result in a chronic condition.  Problems of the lower back that are caused by compensating for the injury, such as limping.  Pain or weakness of the foot during push-off following surgery.  Chronic inflammation, scarring, and partial or complete fascia tear, occurring more often from repeated injections. TREATMENT  Treatment initially involves the use of  ice and medication to help reduce pain and inflammation. The use of strengthening and stretching exercises may help reduce pain with activity, especially stretches of the Achilles tendon. These exercises may be performed at home or with a therapist. Your caregiver may recommend that you use heel cups of arch supports to help reduce stress on the plantar fascia. Occasionally, corticosteroid injections are given to reduce inflammation. If symptoms persist for greater than 6 months despite non-surgical (conservative), then surgery may be recommended.  MEDICATION   If pain medication is necessary, then nonsteroidal anti-inflammatory medications, such as aspirin and ibuprofen, or other minor pain relievers, such as acetaminophen, are often recommended.  Do not take pain medication within 7 days before surgery.  Prescription pain relievers may be given if deemed necessary by your caregiver. Use only as directed and only as much as you need.  Corticosteroid injections may be given by your caregiver. These injections should be reserved for the most serious cases, because they may only be given a certain number of times. HEAT AND COLD  Cold treatment (icing) relieves pain and reduces inflammation. Cold treatment should be applied for 10 to 15 minutes every 2 to 3 hours for inflammation and pain and immediately after any activity that aggravates your symptoms. Use ice packs or massage the area with a piece of ice (ice massage).  Heat treatment may be used prior to performing the stretching and strengthening activities prescribed by your caregiver, physical therapist, or athletic trainer. Use a heat pack or soak the injury in warm water. SEEK IMMEDIATE MEDICAL CARE IF:  Treatment seems to offer no benefit, or the condition worsens.  Any medications produce adverse side effects. EXERCISES RANGE   OF MOTION (ROM) AND STRETCHING EXERCISES - Plantar Fasciitis (Heel Spur Syndrome) These exercises may help you  when beginning to rehabilitate your injury. Your symptoms may resolve with or without further involvement from your physician, physical therapist or athletic trainer. While completing these exercises, remember:   Restoring tissue flexibility helps normal motion to return to the joints. This allows healthier, less painful movement and activity.  An effective stretch should be held for at least 30 seconds.  A stretch should never be painful. You should only feel a gentle lengthening or release in the stretched tissue. RANGE OF MOTION - Toe Extension, Flexion  Sit with your right / left leg crossed over your opposite knee.  Grasp your toes and gently pull them back toward the top of your foot. You should feel a stretch on the bottom of your toes and/or foot.  Hold this stretch for __________ seconds.  Now, gently pull your toes toward the bottom of your foot. You should feel a stretch on the top of your toes and or foot.  Hold this stretch for __________ seconds. Repeat __________ times. Complete this stretch __________ times per day.  RANGE OF MOTION - Ankle Dorsiflexion, Active Assisted  Remove shoes and sit on a chair that is preferably not on a carpeted surface.  Place right / left foot under knee. Extend your opposite leg for support.  Keeping your heel down, slide your right / left foot back toward the chair until you feel a stretch at your ankle or calf. If you do not feel a stretch, slide your bottom forward to the edge of the chair, while still keeping your heel down.  Hold this stretch for __________ seconds. Repeat __________ times. Complete this stretch __________ times per day.  STRETCH - Gastroc, Standing  Place hands on wall.  Extend right / left leg, keeping the front knee somewhat bent.  Slightly point your toes inward on your back foot.  Keeping your right / left heel on the floor and your knee straight, shift your weight toward the wall, not allowing your back to  arch.  You should feel a gentle stretch in the right / left calf. Hold this position for __________ seconds. Repeat __________ times. Complete this stretch __________ times per day. STRETCH - Soleus, Standing  Place hands on wall.  Extend right / left leg, keeping the other knee somewhat bent.  Slightly point your toes inward on your back foot.  Keep your right / left heel on the floor, bend your back knee, and slightly shift your weight over the back leg so that you feel a gentle stretch deep in your back calf.  Hold this position for __________ seconds. Repeat __________ times. Complete this stretch __________ times per day. STRETCH - Gastrocsoleus, Standing  Note: This exercise can place a lot of stress on your foot and ankle. Please complete this exercise only if specifically instructed by your caregiver.   Place the ball of your right / left foot on a step, keeping your other foot firmly on the same step.  Hold on to the wall or a rail for balance.  Slowly lift your other foot, allowing your body weight to press your heel down over the edge of the step.  You should feel a stretch in your right / left calf.  Hold this position for __________ seconds.  Repeat this exercise with a slight bend in your right / left knee. Repeat __________ times. Complete this stretch __________ times per day.    STRENGTHENING EXERCISES - Plantar Fasciitis (Heel Spur Syndrome)  These exercises may help you when beginning to rehabilitate your injury. They may resolve your symptoms with or without further involvement from your physician, physical therapist or athletic trainer. While completing these exercises, remember:   Muscles can gain both the endurance and the strength needed for everyday activities through controlled exercises.  Complete these exercises as instructed by your physician, physical therapist or athletic trainer. Progress the resistance and repetitions only as guided. STRENGTH -  Towel Curls  Sit in a chair positioned on a non-carpeted surface.  Place your foot on a towel, keeping your heel on the floor.  Pull the towel toward your heel by only curling your toes. Keep your heel on the floor.  If instructed by your physician, physical therapist or athletic trainer, add ____________________ at the end of the towel. Repeat __________ times. Complete this exercise __________ times per day. STRENGTH - Ankle Inversion  Secure one end of a rubber exercise band/tubing to a fixed object (table, pole). Loop the other end around your foot just before your toes.  Place your fists between your knees. This will focus your strengthening at your ankle.  Slowly, pull your big toe up and in, making sure the band/tubing is positioned to resist the entire motion.  Hold this position for __________ seconds.  Have your muscles resist the band/tubing as it slowly pulls your foot back to the starting position. Repeat __________ times. Complete this exercises __________ times per day.  Document Released: 03/22/2005 Document Revised: 06/14/2011 Document Reviewed: 07/04/2008 Exit[employer] Patient Information 2015 [employer]. This information is not intended to replace advice given to you by your health care provider. Make sure you discuss any questions you have with your health care provider.  

## 2014-10-24 ENCOUNTER — Other Ambulatory Visit: Payer: Self-pay | Admitting: *Deleted

## 2014-10-24 MED ORDER — LISINOPRIL 20 MG PO TABS: 20.0000 mg | ORAL_TABLET | Freq: Every day | ORAL | Status: DC

## 2014-10-24 MED ORDER — METFORMIN HCL 1000 MG PO TABS: 1000.0000 mg | ORAL_TABLET | Freq: Two times a day (BID) | ORAL | Status: DC

## 2014-12-04 DIAGNOSIS — M79673 Pain in unspecified foot: Secondary | ICD-10-CM

## 2014-12-05 ENCOUNTER — Telehealth: Payer: Self-pay | Admitting: Family Medicine

## 2014-12-05 NOTE — Telephone Encounter (Signed)
Pt was needing lisinopril (PRINIVIL,ZESTRIL) 20 MG tablet But we sent in 7/21 90 day.  tansferred pt to pharm.

## 2014-12-11 NOTE — Telephone Encounter (Signed)
Noted  

## 2014-12-11 NOTE — Telephone Encounter (Signed)
Pt was ablle to get med no action needed

## 2014-12-25 ENCOUNTER — Ambulatory Visit (INDEPENDENT_AMBULATORY_CARE_PROVIDER_SITE_OTHER): Payer: 59 | Admitting: Family Medicine

## 2014-12-25 ENCOUNTER — Ambulatory Visit (INDEPENDENT_AMBULATORY_CARE_PROVIDER_SITE_OTHER)
Admission: RE | Admit: 2014-12-25 | Discharge: 2014-12-25 | Disposition: A | Discharge status: Home / Self Care | Payer: 59 | Source: Ambulatory Visit | Attending: Family Medicine | Admitting: Family Medicine

## 2014-12-25 ENCOUNTER — Ambulatory Visit (INDEPENDENT_AMBULATORY_CARE_PROVIDER_SITE_OTHER): Payer: 59

## 2014-12-25 ENCOUNTER — Encounter: Payer: Self-pay | Admitting: Family Medicine

## 2014-12-25 VITALS — BP 118/82 | HR 91 | Ht 63.0 in | Wt 204.0 lb

## 2014-12-25 DIAGNOSIS — M7661 Achilles tendinitis, right leg: Secondary | ICD-10-CM

## 2014-12-25 DIAGNOSIS — M67439 Ganglion, unspecified wrist: Secondary | ICD-10-CM | POA: Insufficient documentation

## 2014-12-25 DIAGNOSIS — M79641 Pain in right hand: Secondary | ICD-10-CM

## 2014-12-25 DIAGNOSIS — M67431 Ganglion, right wrist: Secondary | ICD-10-CM | POA: Diagnosis not present

## 2014-12-25 MED ORDER — MELOXICAM 15 MG PO TABS: 15.0000 mg | ORAL_TABLET | Freq: Every day | ORAL | Status: DC

## 2014-12-25 NOTE — Assessment & Plan Note (Signed)
Patient did have aspiration done today. Patient warned the potential hypoechoic changes it can happen to the skin. We discussed that 50% of these to like to come back and there is a possibility that this needs to be repeated. I do feel that an x-ray is necessary with her having to potential ganglion cyst. I do feel that there is likely a posttraumatic injury to cause these. Patient will try this and come back in 3-4 weeks for further evaluation and treatment.

## 2014-12-25 NOTE — Progress Notes (Signed)
Pre visit review using our clinic review tool, if applicable. No additional management support is needed unless otherwise documented below in the visit note. 

## 2014-12-25 NOTE — Progress Notes (Signed)
[facility] [address], [place] [address] Phone: [phone] Subjective:    CC: Pain in both wrists left greater than right  [id number] Laura Marsh is a 59 y.o. female coming in with complaint of bilateral wrist pain. Patient was seen 5 months ago and was diagnosed with a de Quervain's tenosynovitis. Patient was given an injection at that time. Patient states her left wrist is feeling much better.  Having worsening symptoms of the right wrist. Seems to be more on the dorsal aspect of the wrist near the thumb. An states that there is a new mass in this area. States that it is very tender with certain range of motion and can give her also numbness to her thumb sometimes. Patient states it feels somewhat similar to the the back of her hand but her back or hand is no longer hurting. States that it is not affecting her daily activities yet but has made things more difficult.    Past medical history, social, surgical and family history all reviewed in electronic medical record.   Review of Systems: No headache, visual changes, nausea, vomiting, diarrhea, constipation, dizziness, abdominal pain, skin rash, fevers, chills, night sweats, weight loss, swollen lymph nodes, body aches, joint swelling, muscle aches, chest pain, shortness of breath, mood changes.   Objective Blood pressure 118/82, pulse 91, height 5\' 3"  (1.6 m), weight 204 lb (92.534 kg), SpO2 99 %.  General: No apparent distress alert and oriented x3 mood and affect normal, dressed appropriately.  HEENT: Pupils equal, extraocular movements intact  Respiratory: Patient's speak in full sentences and does not appear short of breath  Cardiovascular: No lower extremity edema, non tender, no erythema  Skin: Warm dry intact with no signs of infection or rash on extremities or on axial skeleton.  Abdomen: Soft nontender  Neuro: Cranial nerves II through XII are intact, neurovascularly intact  in all extremities with 2+ DTRs and 2+ pulses.  Lymph: No lymphadenopathy of posterior or anterior cervical chain or axillae bilaterally.  Gait normal with good balance and coordination.  MSK:  Non tender with full range of motion and good stability and symmetric strength and tone of shoulders, elbows,  hip, knee and ankles bilaterally.  Wrist:right Dorsal side ganglion cyst that is tender to palpation over the radial artery.still has area on the dorsal side bending is somewhat tender to palpation but seen previously ROM smooth and normal with good flexion and extension and ulnar/radial deviation that is symmetrical with opposite wrist. Palpation is normal over metacarpals, navicular, lunate, and TFCC; tendons without tenderness/ swelling No snuffbox tenderness. No tenderness over Canal of Guyon. Strength 5/5 in all directions without pain. Negative Finkelstein's  tinel's and phalens. Negative Watson's test. No significant change from previous exam  significantly less than the left side  MSK US performed of: right wrist This study was ordered, performed, and interpreted by Charlann Boxer D.O.  Wrist: All extensor compartments visualized and normal TFCC intact. Scapholunate ligament intact. Carpal tunnel visualized and median nerve area normal, flexor tendons all normal in appearance without fraying, tears, or sheath effusions. Power doppler signal normal. Ganglion cyst noted over the radial artery  IMPRESSION:  Ganglion cyst  Procedure: Real-time Ultrasound Guided Injection of right ganglion cyst Device: GE Logiq E  Ultrasound guided injection is preferred based studies that show increased duration, increased effect, greater accuracy, decreased procedural pain, increased response rate, and decreased cost with ultrasound guided versus blind injection.  Verbal informed consent  obtained.  Time-out conducted.  Noted no overlying erythema, induration, or other signs of local infection.    Skin prepped in a sterile fashion.  Local anesthesia: Topical Ethyl chloride.  With sterile technique and under real time ultrasound guidance: With a 21-gauge 2 inch needle patient was injected with 0.5 mL of 0.5% Marcaine and 0.5 mL of Kenalog 40 mg/dL.  Aspiration of gel-like fluid was removed. Completed without difficulty  Pain immediately resolved suggesting accurate placement of the medication.  Advised to call if fevers/chills, erythema, induration, drainage, or persistent bleeding.  Images permanently stored and available for review in the ultrasound unit.  Impression: Technically successful ultrasound guided injection.     Impression and Recommendations:     This case required medical decision making of moderate complexity.

## 2014-12-25 NOTE — Patient Instructions (Signed)
Good to see you Drained a ganglion cyst today.  Ice in 6 hours Xray downstairs today.  See me again in 3-4 weeks.

## 2014-12-31 ENCOUNTER — Other Ambulatory Visit: Payer: Self-pay | Admitting: Family Medicine

## 2015-01-02 ENCOUNTER — Other Ambulatory Visit: Payer: Self-pay | Admitting: Family Medicine

## 2015-01-03 ENCOUNTER — Telehealth: Payer: Self-pay | Admitting: Family Medicine

## 2015-01-03 NOTE — Telephone Encounter (Signed)
Patient said her right hand is still hurting even though she got an injection.  She would also like the results of the xray she took.  Please call her after 4pm.

## 2015-01-06 NOTE — Telephone Encounter (Signed)
Left detailed msg on pt's vmail per pt's request.

## 2015-01-06 NOTE — Telephone Encounter (Signed)
Tell her a lot of arthritis for her age and did have a injury that has caused a lot of problems but it is old.  I am hoping we can help with the plan but if not she may need a hand specialist.

## 2015-01-06 NOTE — Telephone Encounter (Signed)
lmovm for pt to return call.  

## 2015-01-21 ENCOUNTER — Ambulatory Visit: Payer: 59 | Admitting: Family Medicine

## 2015-01-21 DIAGNOSIS — Z0289 Encounter for other administrative examinations: Secondary | ICD-10-CM

## 2015-01-22 DIAGNOSIS — M722 Plantar fascial fibromatosis: Secondary | ICD-10-CM

## 2015-02-11 ENCOUNTER — Telehealth: Payer: Self-pay | Admitting: Family Medicine

## 2015-02-11 MED ORDER — METFORMIN HCL 1000 MG PO TABS: 1000.0000 mg | ORAL_TABLET | Freq: Two times a day (BID) | ORAL | Status: DC

## 2015-02-11 NOTE — Telephone Encounter (Signed)
Pt request refill of the following: metFORMIN (GLUCOPHAGE) 1000 MG tablet   Pt is out of the above med and is asking if a few tablets can be called in she has an appt scheduled for 02/14/15     Phamacy: [facility]

## 2015-02-11 NOTE — Telephone Encounter (Signed)
Rx done. 

## 2015-02-14 ENCOUNTER — Ambulatory Visit (INDEPENDENT_AMBULATORY_CARE_PROVIDER_SITE_OTHER): Payer: 59 | Admitting: Family Medicine

## 2015-02-14 DIAGNOSIS — R69 Illness, unspecified: Secondary | ICD-10-CM

## 2015-02-14 NOTE — Progress Notes (Signed)
LATE CANCEL  

## 2015-02-21 ENCOUNTER — Ambulatory Visit (INDEPENDENT_AMBULATORY_CARE_PROVIDER_SITE_OTHER): Payer: 59 | Admitting: Family Medicine

## 2015-02-21 ENCOUNTER — Encounter: Payer: Self-pay | Admitting: Family Medicine

## 2015-02-21 VITALS — BP 120/82 | HR 90 | Temp 98.2°F | Ht 63.0 in | Wt 202.9 lb

## 2015-02-21 DIAGNOSIS — E785 Hyperlipidemia, unspecified: Secondary | ICD-10-CM

## 2015-02-21 DIAGNOSIS — I10 Essential (primary) hypertension: Secondary | ICD-10-CM

## 2015-02-21 DIAGNOSIS — E119 Type 2 diabetes mellitus without complications: Secondary | ICD-10-CM | POA: Diagnosis not present

## 2015-02-21 LAB — HEMOGLOBIN A1C: HEMOGLOBIN A1C: 7.1 % — AB (ref 4.6–6.5)

## 2015-02-21 LAB — BASIC METABOLIC PANEL
BUN: 17 mg/dL (ref 6–23)
CALCIUM: 10.1 mg/dL (ref 8.4–10.5)
CHLORIDE: 103 meq/L (ref 96–112)
CO2: 28 mEq/L (ref 19–32)
CREATININE: 0.88 mg/dL (ref 0.40–1.20)
GFR: 85.12 mL/min (ref >60.00)
Glucose, Bld: 139 mg/dL — ABNORMAL HIGH (ref 70–99)
Potassium: 3.4 mEq/L — ABNORMAL LOW (ref 3.5–5.1)
Sodium: 141 mEq/L (ref 135–145)

## 2015-02-21 MED ORDER — PRAVASTATIN SODIUM 40 MG PO TABS: 40.0000 mg | ORAL_TABLET | Freq: Every day | ORAL | Status: DC

## 2015-02-21 NOTE — Progress Notes (Signed)
Pre visit review using our clinic review tool, if applicable. No additional management support is needed unless otherwise documented below in the visit note. 

## 2015-02-21 NOTE — Patient Instructions (Signed)
BEFORE YOU LEAVE: -labs -schedule follow up in 4 months  Take medications every day as prescribed and please notify us of any changes to medications.  We recommend the following healthy lifestyle measures: - eat a healthy whole foods diet consisting of regular small meals composed of vegetables, fruits, beans, nuts, seeds, healthy meats such as white chicken and fish and whole grains.  - avoid sweets, white starchy foods, fried foods, fast food, processed foods, sodas, red meet and other fattening foods.  - get a least 150-300 minutes of aerobic exercise per week.   -We have ordered labs or studies at this visit. It can take up to 1-2 weeks for results and processing. We will contact you with instructions IF your results are abnormal. [facility] results will be [facility] to [facility]. If you have not heard from [place] or can not find your results in [facility] in 2 weeks please contact our office.

## 2015-02-21 NOTE — Progress Notes (Signed)
HPI:  Laura Marsh is a pleasant 59 yo with a complicated health history here for follow up 5 months later then advised.   DM: -complications: none -meds: metformin 1000 bid, Januvia 100mg  daily - but reports just started [place] 2 weeks ago -lifestyle: so so -last eye exam: 06/2014 -last foot exam : last visit -denies: hypoglycemia, wounds on feet, vision changes  HTN: -meds: lisinopril 20, triam-hctz 37.52-5 (on with prior PCP) -denies: CP, SOB, DOE, HA  HLD: -meds: pravastatin 40mg  per prior instructions -reports:taking statin -denies: leg cramps, cog changes  Ganglion cysts/CTS/plantar fasciitis: -seeing sports med and podiatry  ROS: See pertinent positives and negatives per HPI.  Past Medical History  Diagnosis Date  . Diabetes mellitus   . Hypertension   . Elevated cholesterol     Past Surgical History  Procedure Laterality Date  . Abdominal hysterectomy      Family History  Problem Relation Age of Onset  . Diabetes Mother   . Congestive Heart Failure Mother     Social History   Social History  . Marital Status: Divorced    Spouse Name: N/A  . Number of Children: N/A  . Years of Education: N/A   Social History Main Topics  . Smoking status: Never Smoker   . Smokeless tobacco: None  . Alcohol Use: No  . Drug Use: No  . Sexual Activity: Not Asked   Other Topics Concern  . None   Social History Narrative     Current outpatient prescriptions:  .  Alcohol Swabs PADS, Use as directed., Disp: 100 each, Rfl: 12 .  Ascorbic Acid (VITAMIN C PO), Take by mouth., Disp: , Rfl:  .  lisinopril (PRINIVIL,ZESTRIL) 20 MG tablet, Take 1 tablet (20 mg total) by mouth daily., Disp: 90 tablet, Rfl: 0 .  meloxicam (MOBIC) 15 MG tablet, Take 1 tablet (15 mg total) by mouth daily., Disp: 30 tablet, Rfl: 1 .  metFORMIN (GLUCOPHAGE) 1000 MG tablet, Take 1 tablet (1,000 mg total) by mouth 2 (two) times daily with a meal., Disp: 60 tablet, Rfl: 0 .  Multiple  Vitamin (MULTIVITAMIN) capsule, Take 1 capsule by mouth daily., Disp: , Rfl:  .  pravastatin (PRAVACHOL) 40 MG tablet, Take 1 tablet (40 mg total) by mouth daily., Disp: 90 tablet, Rfl: 3 .  sitaGLIPtin (JANUVIA) 100 MG tablet, Take 1 tablet (100 mg total) by mouth daily., Disp: 30 tablet, Rfl: 3 .  triamterene-hydrochlorothiazide (MAXZIDE-25) 37.5-25 MG per tablet, TAKE 1 TABLET BY MOUTH DAILY, Disp: 90 tablet, Rfl: 1 .  TRUEPLUS LANCETS 30G MISC, USE AS DIRECTED 2 TIMES A WEEK, Disp: 100 each, Rfl: 3 .  TRUETEST TEST test strip, TEST TWICE WEEKLY., Disp: 100 each, Rfl: 3  EXAM:  Filed Vitals:   02/21/15 1353  BP: 120/82  Pulse: 90  Temp: 98.2 F (36.8 C)    Body mass index is 35.95 kg/(m^2).  GENERAL: vitals reviewed and listed above, alert, oriented, appears well hydrated and in no acute distress  HEENT: atraumatic, conjunttiva clear, no obvious abnormalities on inspection of external nose and ears  NECK: no obvious masses on inspection  LUNGS: clear to auscultation bilaterally, no wheezes, rales or rhonchi, good air movement  CV: HRRR, no peripheral edema  MS: moves all extremities without noticeable abnormality  PSYCH: pleasant and cooperative, no obvious depression or anxiety  ASSESSMENT AND PLAN:  Discussed the following assessment and plan:  Type 2 diabetes mellitus without complication, without long-term current use of insulin (HCC) -  Plan: Hemoglobin A1c  Essential hypertension - Plan: Basic metabolic panel  Hyperlipidemia  -check diabetes labs, plan to continue Januvia if not at goal and recheck at follow up -lifestyle recs -Patient advised to return or notify a doctor immediately if symptoms worsen or persist or new concerns arise.  Patient Instructions  BEFORE YOU LEAVE: -labs -schedule follow up in 4 months  Take medications every day as prescribed and please notify us of any changes to medications.  We recommend the following healthy lifestyle  measures: - eat a healthy whole foods diet consisting of regular small meals composed of vegetables, fruits, beans, nuts, seeds, healthy meats such as white chicken and fish and whole grains.  - avoid sweets, white starchy foods, fried foods, fast food, processed foods, sodas, red meet and other fattening foods.  - get a least 150-300 minutes of aerobic exercise per week.   -We have ordered labs or studies at this visit. It can take up to 1-2 weeks for results and processing. We will contact you with instructions IF your results are abnormal. Normal results will be released to your [facility]. If you have not heard from [place] or can not find your results in [facility] in 2 weeks please contact our office.            Colin Benton R.

## 2015-02-22 LAB — HM MAMMOGRAPHY: HM MAMMO: NEGATIVE

## 2015-02-26 ENCOUNTER — Encounter: Payer: Self-pay | Admitting: Family Medicine

## 2015-03-11 ENCOUNTER — Telehealth: Payer: Self-pay | Admitting: Family Medicine

## 2015-03-11 ENCOUNTER — Other Ambulatory Visit: Payer: Self-pay | Admitting: Family Medicine

## 2015-03-11 NOTE — Telephone Encounter (Signed)
Rxs done. 

## 2015-03-11 NOTE — Telephone Encounter (Signed)
Pt request refill of the following:   metFORMIN (GLUCOPHAGE) 1000 MG tablet , lisinopril (PRINIVIL,ZESTRIL) 20 MG tablet   Phamacy: [facility]

## 2015-04-21 MED FILL — JANUVIA 100 MG TABLET: 100 | 30 days supply | Qty: 30 | Fill #1

## 2015-04-22 ENCOUNTER — Ambulatory Visit (INDEPENDENT_AMBULATORY_CARE_PROVIDER_SITE_OTHER): Payer: 59 | Admitting: Podiatry

## 2015-04-22 ENCOUNTER — Encounter: Payer: Self-pay | Admitting: Podiatry

## 2015-04-22 VITALS — BP 139/91 | HR 110 | Resp 12

## 2015-04-22 DIAGNOSIS — M722 Plantar fascial fibromatosis: Secondary | ICD-10-CM

## 2015-04-22 DIAGNOSIS — M7661 Achilles tendinitis, right leg: Secondary | ICD-10-CM

## 2015-04-22 MED ORDER — TRIAMCINOLONE ACETONIDE 10 MG/ML IJ SUSP
10.0000 mg | Freq: Once | INTRAMUSCULAR | Status: AC
  Administered 2015-04-22: 10 mg

## 2015-04-22 NOTE — Patient Instructions (Signed)
Today year diabetic examination revealed adequate pulsation adequate feeling Right heel pain on bottom is plantar fasciitis right Pain in the back heel is Achilles tendinitis right Please do stretches with your knee straight, shoe on 3-5 times a day 1-2 minutes with the right foot behind the left foot Avoid NSAIDs as much as possible

## 2015-04-22 NOTE — Progress Notes (Signed)
   Subjective:    Patient ID: Laura Marsh, female    DOB: [birth year], 59 y.o.   MRN: [id number]  HPI Patient presents today complaining of posterior and inferior right heel pain. The symptoms are aggravated with standing walking relieved with rest. Patient is attempted to wear custom foot orthotics, however, are not comfortable and she discontinue wearing orthotics. She has a history of bilateral plantar fasciitis with resolution of pain in the left and persistence of pain in the right. She describes taking meloxicam 15 mg once daily approximately for 7 days which reduces the symptoms for anywhere from 1-2 months and that she restarts them meloxicam. She started meloxicam the last several days.  Patient is diabetic without a history of amputation, claudication or foot ulceration   Review of Systems  Musculoskeletal: Positive for joint swelling and gait problem.       Objective:   Physical Exam Orientated 3  Vascular: No peripheral edema bilaterally DP pulses 2/4 bilaterally PT pulses 2/4 bilaterally Capillary reflex immediate bilaterally  Neurological: Sensation to 10 g monofilament wire intact 5/5 bilaterally Vibratory sensation reactive bilaterally Ankle reflex equal and reactive bilaterally  Dermatological: No open skin lesions bilaterally Should turgor within normal limits  Musculoskeletal: Palpable tenderness medial plantar fascial band right without any palpable lesions Palpable tenderness tendo Achilles right without any palpable lesions Patient easily able to heel off bilaterally Ankle dorsiflexion 90 with the extended         Assessment & Plan:   Satisfactory neurovascular status Plantar fasciitis right Achilles tendinitis right  Plan: Today review the results of the examination with patient today I recommended stretching with knee extended to stretch gastroc muscle 3-5 times a day for several minutes. I offered patient Kenalog injection for  plantar fasciitis right and she verbally consents. She is made aware that her blood glucose level increase slightly after the injection  Skin is prepped with alcohol and Betadine and 10 mg of Kenalog mixed with 10 mg of plain Xylocaine and 0.5 mg of 0.5% plain Marcaine were injected inferior heel right for Kenalog injection #1. Patient tolerated procedure without any difficulty.  Patient will wear athletic style shoes, fasciitis strap and stretching as described above. I prefer the patient not take ongoing meloxicam did not refill medication at this time  Reappoint at patient's request

## 2015-04-23 ENCOUNTER — Ambulatory Visit: Payer: 59 | Admitting: Podiatry

## 2015-05-13 DIAGNOSIS — H524 Presbyopia: Secondary | ICD-10-CM | POA: Diagnosis not present

## 2015-05-13 DIAGNOSIS — H52221 Regular astigmatism, right eye: Secondary | ICD-10-CM | POA: Diagnosis not present

## 2015-05-13 LAB — HM DIABETES EYE EXAM

## 2015-05-16 MED FILL — TRIAMTERENE-HCTZ 37.5-25 MG: 37.5-25 | 90 days supply | Qty: 90 | Fill #1

## 2015-05-22 ENCOUNTER — Encounter: Payer: Self-pay | Admitting: Family Medicine

## 2015-05-23 MED FILL — JANUVIA 100 MG TABLET: 100 | 30 days supply | Qty: 30 | Fill #2

## 2015-05-29 ENCOUNTER — Other Ambulatory Visit: Payer: Self-pay | Admitting: Family Medicine

## 2015-05-29 MED FILL — PRAVASTATIN NA 20 MG TAB: 20 | 30 days supply | Qty: 60 | Fill #0

## 2015-06-11 MED FILL — LISINOPRIL 20 MG TABLET: 20 | 90 days supply | Qty: 90 | Fill #1

## 2015-06-20 ENCOUNTER — Ambulatory Visit: Payer: 59 | Admitting: Family Medicine

## 2015-06-20 DIAGNOSIS — Z6836 Body mass index (BMI) 36.0-36.9, adult: Secondary | ICD-10-CM | POA: Diagnosis not present

## 2015-06-20 DIAGNOSIS — Z01419 Encounter for gynecological examination (general) (routine) without abnormal findings: Secondary | ICD-10-CM | POA: Diagnosis not present

## 2015-06-24 ENCOUNTER — Other Ambulatory Visit: Payer: Self-pay | Admitting: Family Medicine

## 2015-06-24 MED FILL — JANUVIA 100 MG TABLET: 100 | 30 days supply | Qty: 30 | Fill #0

## 2015-07-07 MED FILL — metFORMIN HCL 1000 MG TABS: 1000 | 90 days supply | Qty: 180 | Fill #1

## 2015-07-08 ENCOUNTER — Ambulatory Visit: Payer: 59 | Admitting: Family Medicine

## 2015-07-25 ENCOUNTER — Ambulatory Visit: Payer: 59 | Admitting: Family Medicine

## 2015-08-02 MED FILL — JANUVIA 100 MG TABLET: 100 | 30 days supply | Qty: 30 | Fill #1

## 2015-08-04 ENCOUNTER — Other Ambulatory Visit: Payer: Self-pay | Admitting: Family Medicine

## 2015-08-05 MED FILL — PRAVASTATIN NA 20 MG TAB: 20 | 30 days supply | Qty: 60 | Fill #0

## 2015-08-15 ENCOUNTER — Ambulatory Visit: Payer: 59 | Admitting: Family Medicine

## 2015-08-29 ENCOUNTER — Ambulatory Visit: Payer: 59 | Admitting: Family Medicine

## 2015-09-02 ENCOUNTER — Other Ambulatory Visit: Payer: Self-pay | Admitting: Family Medicine

## 2015-09-02 MED FILL — AZITHROMYCIN 250 MG TABLET: 250 | 5 days supply | Qty: 6 | Fill #0

## 2015-09-03 ENCOUNTER — Encounter: Payer: Self-pay | Admitting: Adult Health

## 2015-09-03 ENCOUNTER — Ambulatory Visit (INDEPENDENT_AMBULATORY_CARE_PROVIDER_SITE_OTHER): Payer: 59 | Admitting: Adult Health

## 2015-09-03 VITALS — BP 134/94 | Temp 98.2°F | Wt 213.6 lb

## 2015-09-03 DIAGNOSIS — J069 Acute upper respiratory infection, unspecified: Secondary | ICD-10-CM | POA: Diagnosis not present

## 2015-09-03 MED ORDER — LISINOPRIL 20 MG PO TABS: 20.0000 mg | ORAL_TABLET | Freq: Every day | ORAL | Status: DC

## 2015-09-03 MED ORDER — SITAGLIPTIN PHOSPHATE 100 MG PO TABS: 100.0000 mg | ORAL_TABLET | Freq: Every day | ORAL | Status: DC

## 2015-09-03 MED FILL — LISINOPRIL 20 MG TABLET: 20 | 90 days supply | Qty: 90 | Fill #0

## 2015-09-03 MED FILL — JANUVIA 100 MG TABLET: 100 | 30 days supply | Qty: 30 | Fill #0

## 2015-09-03 MED FILL — TRIAMTERENE/HCTZ 37.5/25 TB: 37.5-25 | 90 days supply | Qty: 90 | Fill #0

## 2015-09-03 NOTE — Telephone Encounter (Signed)
Ok to refill for 6 months 

## 2015-09-03 NOTE — Telephone Encounter (Signed)
Ok to refill 

## 2015-09-03 NOTE — Progress Notes (Signed)
Subjective:    Patient ID: Laura Marsh, female    DOB: 03-23-1956, 60 y.o.   MRN: [id number]  URI  This is a new problem. The current episode started in the past 7 days. The problem has been gradually improving. There has been no fever. Associated symptoms include congestion (nasal congestion ), coughing (productive cough ) and sinus pain. Pertinent negatives include no ear pain, headaches, rhinorrhea or sore throat. She has tried decongestant for the symptoms. The treatment provided mild relief.      Review of Systems  HENT: Positive for congestion (nasal congestion ). Negative for ear pain, rhinorrhea and sore throat.   Eyes: Negative.   Respiratory: Positive for cough (productive cough ).   Cardiovascular: Negative.   Gastrointestinal: Negative.   Neurological: Negative for headaches.   Past Medical History  Diagnosis Date  . Diabetes mellitus   . Hypertension   . Elevated cholesterol     Social History   Social History  . Marital Status: Divorced    Spouse Name: N/A  . Number of Children: N/A  . Years of Education: N/A   Occupational History  . Not on file.   Social History Main Topics  . Smoking status: Never Smoker   . Smokeless tobacco: Not on file  . Alcohol Use: No  . Drug Use: No  . Sexual Activity: Not on file   Other Topics Concern  . Not on file   Social History Narrative    Past Surgical History  Procedure Laterality Date  . Abdominal hysterectomy      Family History  Problem Relation Age of Onset  . Diabetes Mother   . Congestive Heart Failure Mother     No Known Allergies  Current Outpatient Prescriptions on File Prior to Visit  Medication Sig Dispense Refill  . Alcohol Swabs PADS Use as directed. 100 each 12  . Ascorbic Acid (VITAMIN C PO) Take by mouth.    . meloxicam (MOBIC) 15 MG tablet Take 1 tablet (15 mg total) by mouth daily. 30 tablet 1  . metFORMIN (GLUCOPHAGE) 1000 MG tablet TAKE 1 TABLET BY MOUTH 2 TIMES DAILY  WITH A MEAL. 180 tablet 1  . Multiple Vitamin (MULTIVITAMIN) capsule Take 1 capsule by mouth daily.    . pravastatin (PRAVACHOL) 20 MG tablet TAKE 2 TABLETS BY MOUTH DAILY 60 tablet 0  . pravastatin (PRAVACHOL) 40 MG tablet Take 1 tablet (40 mg total) by mouth daily. 90 tablet 3  . triamterene-hydrochlorothiazide (MAXZIDE-25) 37.5-25 MG per tablet TAKE 1 TABLET BY MOUTH DAILY 90 tablet 1  . triamterene-hydrochlorothiazide (MAXZIDE-25) 37.5-25 MG tablet TAKE 1 TABLET BY MOUTH ONCE DAILY 90 tablet 1  . TRUEPLUS LANCETS 30G MISC USE AS DIRECTED 2 TIMES A WEEK 100 each 3  . TRUETEST TEST test strip TEST TWICE WEEKLY. 100 each 3   No current facility-administered medications on file prior to visit.    BP 134/94 mmHg  Temp(Src) 98.2 F (36.8 C) (Oral)  Wt 213 lb 9.6 oz (96.888 kg)       Objective:   Physical Exam  Constitutional: She is oriented to person, place, and time. She appears well-developed and well-nourished. No distress.  HENT:  Head: Normocephalic and atraumatic.  Right Ear: External ear normal.  Left Ear: External ear normal.  Nose: Nose normal.  Mouth/Throat: Oropharynx is clear and moist. No oropharyngeal exudate.  Eyes: Conjunctivae and EOM are normal. Pupils are equal, round, and reactive to light. Right eye exhibits no discharge.  Left eye exhibits no discharge.  Neck: Normal range of motion. Neck supple. No thyromegaly present.  Cardiovascular: Normal rate, regular rhythm, normal heart sounds and intact distal pulses.  Exam reveals no gallop and no friction rub.   No murmur heard. Pulmonary/Chest: Effort normal and breath sounds normal. No respiratory distress. She has no wheezes. She has no rales. She exhibits no tenderness.  Lymphadenopathy:    She has no cervical adenopathy.  Neurological: She is alert and oriented to person, place, and time.  Skin: Skin is warm and dry. No rash noted. She is not diaphoretic. No erythema. No pallor.  Psychiatric: She has a normal  mood and affect. Her behavior is normal. Judgment and thought content normal.  Nursing note and vitals reviewed.      Assessment & Plan:  1. URI (upper respiratory infection) -Appears to be viral  - Flonase and Claritin  - Follow up with PCP if no improvement  Dorothyann Peng, NP

## 2015-09-03 NOTE — Patient Instructions (Addendum)
It was great meeting you today!  Your exam is consistent with a viral upper respiratory infection.   Use Flonase and Claritin. If needed you can use Sudafed for a short time.   Drink plenty of fluid and get some rest  Follow up with Dr. Maudie Mercury if no improvement in the next 2-3 days.   Upper Respiratory Infection, Adult Most upper respiratory infections (URIs) are a viral infection of the air passages leading to the lungs. A URI affects the nose, throat, and upper air passages. The most common type of URI is nasopharyngitis and is typically referred to as "the common cold." URIs run their course and usually go away on their own. Most of the time, a URI does not require medical attention, but sometimes a bacterial infection in the upper airways can follow a viral infection. This is called a secondary infection. Sinus and middle ear infections are common types of secondary upper respiratory infections. Bacterial pneumonia can also complicate a URI. A URI can worsen asthma and chronic obstructive pulmonary disease (COPD). Sometimes, these complications can require emergency medical care and may be life threatening.  CAUSES Almost all URIs are caused by viruses. A virus is a type of germ and can spread from one person to another.  RISKS FACTORS You may be at risk for a URI if:   You smoke.   You have chronic heart or lung disease.  You have a weakened defense (immune) system.   You are very young or very old.   You have nasal allergies or asthma.  You work in crowded or poorly ventilated areas.  You work in health care facilities or schools. SIGNS AND SYMPTOMS  Symptoms typically develop 2-3 days after you come in contact with a cold virus. Most viral URIs last 7-10 days. However, viral URIs from the influenza virus (flu virus) can last 14-18 days and are typically more severe. Symptoms may include:   Runny or stuffy (congested) nose.   Sneezing.   Cough.   Sore throat.    Headache.   Fatigue.   Fever.   Loss of appetite.   Pain in your forehead, behind your eyes, and over your cheekbones (sinus pain).  Muscle aches.  DIAGNOSIS  Your health care provider may diagnose a URI by:  Physical exam.  Tests to check that your symptoms are not due to another condition such as:  Strep throat.  Sinusitis.  Pneumonia.  Asthma. TREATMENT  A URI goes away on its own with time. It cannot be cured with medicines, but medicines may be prescribed or recommended to relieve symptoms. Medicines may help:  Reduce your fever.  Reduce your cough.  Relieve nasal congestion. HOME CARE INSTRUCTIONS   Take medicines only as directed by your health care provider.   Gargle warm saltwater or take cough drops to comfort your throat as directed by your health care provider.  Use a warm mist humidifier or inhale steam from a shower to increase air moisture. This may make it easier to breathe.  Drink enough fluid to keep your urine clear or pale yellow.   Eat soups and other clear broths and maintain good nutrition.   Rest as needed.   Return to work when your temperature has returned to normal or as your health care provider advises. You may need to stay home longer to avoid infecting others. You can also use a face mask and careful hand washing to prevent spread of the virus.  Increase the usage of  your inhaler if you have asthma.   Do not use any tobacco products, including cigarettes, chewing tobacco, or electronic cigarettes. If you need help quitting, ask your health care provider. PREVENTION  The best way to protect yourself from getting a cold is to practice good hygiene.   Avoid oral or hand contact with people with cold symptoms.   Wash your hands often if contact occurs.  There is no clear evidence that vitamin C, vitamin E, echinacea, or exercise reduces the chance of developing a cold. However, it is always recommended to get plenty  of rest, exercise, and practice good nutrition.  SEEK MEDICAL CARE IF:   You are getting worse rather than better.   Your symptoms are not controlled by medicine.   You have chills.  You have worsening shortness of breath.  You have brown or red mucus.  You have yellow or brown nasal discharge.  You have pain in your face, especially when you bend forward.  You have a fever.  You have swollen neck glands.  You have pain while swallowing.  You have white areas in the back of your throat. SEEK IMMEDIATE MEDICAL CARE IF:   You have severe or persistent:  Headache.  Ear pain.  Sinus pain.  Chest pain.  You have chronic lung disease and any of the following:  Wheezing.  Prolonged cough.  Coughing up blood.  A change in your usual mucus.  You have a stiff neck.  You have changes in your:  Vision.  Hearing.  Thinking.  Mood. MAKE SURE YOU:   Understand these instructions.  Will watch your condition.  Will get help right away if you are not doing well or get worse.   This information is not intended to replace advice given to you by your health care provider. Make sure you discuss any questions you have with your health care provider.   Document Released: 09/15/2000 Document Revised: 08/06/2014 Document Reviewed: 06/27/2013 Elsevier Interactive Patient Education [employer].

## 2015-09-19 ENCOUNTER — Ambulatory Visit: Payer: 59 | Admitting: Family Medicine

## 2015-10-02 ENCOUNTER — Encounter: Payer: Self-pay | Admitting: Family Medicine

## 2015-10-02 ENCOUNTER — Ambulatory Visit (INDEPENDENT_AMBULATORY_CARE_PROVIDER_SITE_OTHER): Payer: 59 | Admitting: Family Medicine

## 2015-10-02 VITALS — BP 112/80 | HR 102 | Temp 98.2°F | Ht 63.0 in | Wt 204.8 lb

## 2015-10-02 DIAGNOSIS — E669 Obesity, unspecified: Secondary | ICD-10-CM

## 2015-10-02 DIAGNOSIS — D229 Melanocytic nevi, unspecified: Secondary | ICD-10-CM

## 2015-10-02 DIAGNOSIS — I10 Essential (primary) hypertension: Secondary | ICD-10-CM | POA: Diagnosis not present

## 2015-10-02 DIAGNOSIS — E119 Type 2 diabetes mellitus without complications: Secondary | ICD-10-CM | POA: Diagnosis not present

## 2015-10-02 DIAGNOSIS — E785 Hyperlipidemia, unspecified: Secondary | ICD-10-CM

## 2015-10-02 LAB — POCT GLYCOSYLATED HEMOGLOBIN (HGB A1C): HEMOGLOBIN A1C: 7.5

## 2015-10-02 MED ORDER — FUROSEMIDE 20 MG PO TABS: 20.0000 mg | ORAL_TABLET | Freq: Every day | ORAL | Status: DC

## 2015-10-02 MED FILL — FUROSEMIDE 20 MG TABLET: 20 | 30 days supply | Qty: 30 | Fill #0

## 2015-10-02 NOTE — Progress Notes (Addendum)
HPI:   Laura Marsh is a pleasant 60 yo with a complicated health history here for follow up 5 months later then advised.   DM: -complications: none -meds: metformin 1000 bid, Januvia 100mg  daily - she finally admitted misses several days of her meds per week - thinks she feels unwell when takes, was taking after lunch? -lifestyle: sugar, sweets, carbs daily; starting to walk a little -last eye exam: 06/2014 -last foot exam : reports podiatrist in her office dose this -denies: hypoglycemia, wounds on feet, vision changes  HTN: -meds: lisinopril 20, wonders if can stop maxide and try lasix as has chronic swelling in legs, trying compression -denies: CP, SOB, DOE, HA  HLD: -meds: pravastatin 40mg  daily -denies: leg cramps, cog changes  Ganglion cysts/CTS/plantar fasciitis: -seeing sports med and podiatry  ROS: See pertinent positives and negatives per HPI.  Past Medical History  Diagnosis Date  . Diabetes mellitus   . Hypertension   . Elevated cholesterol     Past Surgical History  Procedure Laterality Date  . Abdominal hysterectomy      Family History  Problem Relation Age of Onset  . Diabetes Mother   . Congestive Heart Failure Mother     Social History   Social History  . Marital Status: Divorced    Spouse Name: N/A  . Number of Children: N/A  . Years of Education: N/A   Social History Main Topics  . Smoking status: Never Smoker   . Smokeless tobacco: None  . Alcohol Use: No  . Drug Use: No  . Sexual Activity: Not Asked   Other Topics Concern  . None   Social History Narrative     Current outpatient prescriptions:  .  Alcohol Swabs PADS, Use as directed., Disp: 100 each, Rfl: 12 .  Ascorbic Acid (VITAMIN C PO), Take by mouth., Disp: , Rfl:  .  lisinopril (PRINIVIL,ZESTRIL) 20 MG tablet, Take 1 tablet (20 mg total) by mouth daily., Disp: 90 tablet, Rfl: 0 .  meloxicam (MOBIC) 15 MG tablet, Take 1 tablet (15 mg total) by mouth daily.,  Disp: 30 tablet, Rfl: 1 .  metFORMIN (GLUCOPHAGE) 1000 MG tablet, TAKE 1 TABLET BY MOUTH 2 TIMES DAILY WITH A MEAL., Disp: 180 tablet, Rfl: 1 .  Multiple Vitamin (MULTIVITAMIN) capsule, Take 1 capsule by mouth daily., Disp: , Rfl:  .  pravastatin (PRAVACHOL) 40 MG tablet, Take 1 tablet (40 mg total) by mouth daily., Disp: 90 tablet, Rfl: 3 .  sitaGLIPtin (JANUVIA) 100 MG tablet, Take 1 tablet (100 mg total) by mouth daily., Disp: 30 tablet, Rfl: 1 .  TRUEPLUS LANCETS 30G MISC, USE AS DIRECTED 2 TIMES A WEEK, Disp: 100 each, Rfl: 3 .  TRUETEST TEST test strip, TEST TWICE WEEKLY., Disp: 100 each, Rfl: 3 .  furosemide (LASIX) 20 MG tablet, Take 1 tablet (20 mg total) by mouth daily., Disp: 30 tablet, Rfl: 3  EXAM:  Filed Vitals:   10/02/15 1707  BP: 112/80  Pulse: 102  Temp: 98.2 F (36.8 C)    Body mass index is 36.29 kg/(m^2).  GENERAL: vitals reviewed and listed above, alert, oriented, appears well hydrated and in no acute distress  HEENT: atraumatic, conjunttiva clear, no obvious abnormalities on inspection of external nose and ears  NECK: no obvious masses on inspection  LUNGS: clear to auscultation bilaterally, no wheezes, rales or rhonchi, good air movement  CV: HRRR, no peripheral edema  MS: moves all extremities without noticeable abnormality  PSYCH: pleasant and cooperative,  no obvious depression or anxiety  FOOT exam: swelling ankles, monofilament and pulses ok, nevus R great toe, dome shaped skin lesion L great toe dorsal aspect near nail base  ASSESSMENT AND PLAN:  Discussed the following assessment and plan: More than 50% of over 60 minutes spent in total in caring for this patient was spent face-to-face with the patient, counseling and/or coordinating care.   Essential hypertension - Plan: Basic metabolic panel, CBC with Differential/Platelets  Type 2 diabetes mellitus without complication, without long-term current use of insulin ([place]) - Plan: POC HgB  A1c  Hyperlipidemia  Obesity  Nevus  -discussed options for meds, she is going to try taking with meals instead of changing which may help with the upset stomach - compliance is an issue -discussed importance of improved diabetic control, implications, risks and potential complications associated with uncontrolled diabetes -eats a lot of sugar - advised healthy low carb diet and spent quite a bit of time discussing healthy and affordable and quick choices -advised regular aerobic exercise -change to lasix per her request; will stop maxzide -she will plan to do labs at follow up in 1 month, advised to come fasting -follow up podiatry foot issue - query mucoid cyst -advise derm eval nevus on foot or biopsy, she agreed to call derm for eval -Patient advised to return or notify a doctor immediately if symptoms worsen or persist or new concerns arise.  Patient Instructions  Follow up in 1 month: -lab -come fasting as will need to check labs if possible  Stop maxzide (triam-hctz).  Start the lasix and take 20mg   once daily in the morning.  We recommend the following healthy lifestyle measures: - eat a healthy whole foods diet consisting of regular small meals composed of vegetables, fruits, beans, nuts, seeds, healthy meats such as white chicken and fish and whole grains.  - avoid sweets, white starchy foods, fried foods, fast food, processed foods, sodas, red meet and other fattening foods.  - get a least 150-300 minutes of aerobic exercise per week.   Follow up with podiatrist about you feet.  See dermatologist about the mole on your toe     Colin Benton R., DO

## 2015-10-02 NOTE — Progress Notes (Signed)
Pre visit review using our clinic review tool, if applicable. No additional management support is needed unless otherwise documented below in the visit note. 

## 2015-10-02 NOTE — Patient Instructions (Addendum)
Follow up in 1 month: -lab -come fasting as will need to check labs if possible  Stop maxzide (triam-hctz).  Start the lasix and take 20mg   once daily in the morning.  We recommend the following healthy lifestyle measures: - eat a healthy whole foods diet consisting of regular small meals composed of vegetables, fruits, beans, nuts, seeds, healthy meats such as white chicken and fish and whole grains.  - avoid sweets, white starchy foods, fried foods, fast food, processed foods, sodas, red meet and other fattening foods.  - get a least 150-300 minutes of aerobic exercise per week.   Follow up with podiatrist about you feet.  See dermatologist about the mole on your toe

## 2015-10-03 ENCOUNTER — Telehealth: Payer: Self-pay | Admitting: Family Medicine

## 2015-10-03 MED ORDER — METFORMIN HCL 1000 MG PO TABS: ORAL_TABLET | ORAL | Status: DC

## 2015-10-03 MED ORDER — GLUCOSE BLOOD VI STRP: ORAL_STRIP | Status: DC

## 2015-10-03 MED ORDER — TRUEPLUS LANCETS 30G MISC
Status: DC
Start: 2015-10-03 — End: 2018-06-16

## 2015-10-03 MED ORDER — SITAGLIPTIN PHOSPHATE 100 MG PO TABS: 100.0000 mg | ORAL_TABLET | Freq: Every day | ORAL | Status: DC

## 2015-10-03 MED FILL — JANUVIA 100 MG TABLET: 100 | 90 days supply | Qty: 90 | Fill #0

## 2015-10-03 MED FILL — TRUEplus LANCETS 30G MISC: 90 days supply | Qty: 100 | Fill #0

## 2015-10-03 MED FILL — TRUE METRIX GLUCOSE TEST ST: 90 days supply | Qty: 50 | Fill #0

## 2015-10-03 MED FILL — metFORMIN HCL 1000 MG TABS: 1000 | 90 days supply | Qty: 180 | Fill #0

## 2015-10-03 NOTE — Telephone Encounter (Signed)
Pt need new Rx for Metformin,Januvia,Truetest test strip and Trueplus Lancets   Pharm: [facility]

## 2015-10-03 NOTE — Telephone Encounter (Signed)
Rx done. 

## 2015-10-16 ENCOUNTER — Other Ambulatory Visit: Payer: Self-pay | Admitting: Family Medicine

## 2015-10-16 MED FILL — PRAVASTATIN NA 20 MG TAB: 20 | 30 days supply | Qty: 60 | Fill #0

## 2015-11-10 NOTE — Progress Notes (Signed)
HPI:  Laura Marsh is a pleasant 60 yo with a complicated health history and prior hx poor compliance here for follow up  DM: -complications: none -meds: metformin 1000 bid, Januvia 100mg  daily -  Missing doses in the past; now reports taking daily but feels metformin makes her feels sick - can't really tell me how it makes her sick -lifestyle: trying to improve diet - very poor in the past - stressed lifestyle changes last visit -last eye exam: 06/2014 -last foot exam : reports podiatrist in her office dose this -denies: hypoglycemia, wounds on feet, vision changes  HTN: -meds: lisinopril 20, stopped maxide and trying lasix per her request since last vist - she did not take meds this morning -chronic LE edema -denies: CP, SOB, DOE, HA  HLD: -meds: pravastatin 40mg  daily -denies: leg cramps, cog changes  Ganglion cysts/CTS/plantar fasciitis: -seeing sports med and podiatry   ROS: See pertinent positives and negatives per HPI.  Past Medical History:  Diagnosis Date  . Diabetes mellitus   . Elevated cholesterol   . Hypertension     Past Surgical History:  Procedure Laterality Date  . ABDOMINAL HYSTERECTOMY      Family History  Problem Relation Age of Onset  . Diabetes Mother   . Congestive Heart Failure Mother     Social History   Social History  . Marital status: Divorced    Spouse name: N/A  . Number of children: N/A  . Years of education: N/A   Social History Main Topics  . Smoking status: Never Smoker  . Smokeless tobacco: None  . Alcohol use No  . Drug use: No  . Sexual activity: Not Asked   Other Topics Concern  . None   Social History Narrative  . None     Current Outpatient Prescriptions:  .  Alcohol Swabs PADS, Use as directed., Disp: 100 each, Rfl: 12 .  Ascorbic Acid (VITAMIN C PO), Take by mouth., Disp: , Rfl:  .  furosemide (LASIX) 20 MG tablet, Take 1 tablet (20 mg total) by mouth daily., Disp: 30 tablet, Rfl: 3 .   glucose blood (TRUETEST TEST) test strip, TEST TWICE WEEKLY., Disp: 100 each, Rfl: 3 .  lisinopril (PRINIVIL,ZESTRIL) 20 MG tablet, Take 1 tablet (20 mg total) by mouth daily., Disp: 90 tablet, Rfl: 3 .  meloxicam (MOBIC) 15 MG tablet, Take 1 tablet (15 mg total) by mouth daily., Disp: 30 tablet, Rfl: 1 .  Multiple Vitamin (MULTIVITAMIN) capsule, Take 1 capsule by mouth daily., Disp: , Rfl:  .  pravastatin (PRAVACHOL) 40 MG tablet, Take 1 tablet (40 mg total) by mouth daily., Disp: 90 tablet, Rfl: 3 .  sitaGLIPtin (JANUVIA) 100 MG tablet, Take 1 tablet (100 mg total) by mouth daily., Disp: 90 tablet, Rfl: 1 .  TRUEPLUS LANCETS 30G MISC, USE AS DIRECTED 2 TIMES A WEEK, Disp: 100 each, Rfl: 3 .  metformin (FORTAMET) 1000 MG (OSM) 24 hr tablet, Take 2 tablets (2,000 mg total) by mouth daily., Disp: 60 tablet, Rfl: 3  EXAM:  Vitals:   11/11/15 0847  BP: 126/88  Pulse: 85  Temp: 98.5 F (36.9 C)    Body mass index is 36.4 kg/m.  GENERAL: vitals reviewed and listed above, alert, oriented, appears well hydrated and in no acute distress  HEENT: atraumatic, conjunttiva clear, no obvious abnormalities on inspection of external nose and ears  NECK: no obvious masses on inspection  LUNGS: clear to auscultation bilaterally, no wheezes, rales or rhonchi, good  air movement  CV: HRRR, no peripheral edema  MS: moves all extremities without noticeable abnormality  PSYCH: pleasant and cooperative, no obvious depression or anxiety  ASSESSMENT AND PLAN:  Discussed the following assessment and plan:  Type 2 diabetes mellitus without complication, without long-term current use of insulin (HCC)  Essential hypertension - Plan: Basic metabolic panel, CBC (no diff)  Hyperlipidemia - Plan: Lipid Panel  -opted to try long acting metformin to see if she tolerates better -FASTING labs -lifestyle recs -compression -Patient advised to return or notify a doctor immediately if symptoms worsen or  persist or new concerns arise.  Patient Instructions  BEFORE YOU LEAVE: -follow up: CPE in 3 months -labs  Lets try changing to the long acting metformin. Stop the regular metformin. Start with 1000mg  daily at dinner for 1 week, then increase to 2000mg  daily at dinner.  Take all of your medications every day as instructed.  We recommend the following healthy lifestyle: 1) Small portions - eat off of salad plate instead of dinner plate 2) Eat a healthy clean diet with avoidance of (less then 1 serving per week) sweets, processed foods, sweetened drinks, white starches, red meat, fast foods and sweets and consisting of: * 5-9 servings per day of fresh or frozen fruits and vegetables (not corn or potatoes, not dried or canned) *nuts and seeds, beans *olives and olive oil *small portions of lean meats such as fish and white chicken  *small portions of whole grains 3)Get at least 150 minutes of sweaty aerobic exercise per week 4)reduce stress - counseling, meditation, relaxation to balance other aspects of your life  We have ordered labs or studies at this visit. It can take up to 1-2 weeks for results and processing. IF results require follow up or explanation, we will call you with instructions. Clinically stable results will be released to your [facility] If you have not heard from [place] or cannot find your results in [facility] in 2 weeks please contact our office at [phone].  If you are not yet signed up for [facility], please consider signing up.            Colin Benton R., DO

## 2015-11-11 ENCOUNTER — Ambulatory Visit (INDEPENDENT_AMBULATORY_CARE_PROVIDER_SITE_OTHER): Payer: 59 | Admitting: Family Medicine

## 2015-11-11 ENCOUNTER — Telehealth: Payer: Self-pay | Admitting: *Deleted

## 2015-11-11 ENCOUNTER — Encounter: Payer: Self-pay | Admitting: Family Medicine

## 2015-11-11 VITALS — BP 126/88 | HR 85 | Temp 98.5°F | Ht 63.0 in | Wt 205.5 lb

## 2015-11-11 DIAGNOSIS — E785 Hyperlipidemia, unspecified: Secondary | ICD-10-CM

## 2015-11-11 DIAGNOSIS — I10 Essential (primary) hypertension: Secondary | ICD-10-CM | POA: Diagnosis not present

## 2015-11-11 DIAGNOSIS — R899 Unspecified abnormal finding in specimens from other organs, systems and tissues: Secondary | ICD-10-CM

## 2015-11-11 DIAGNOSIS — E119 Type 2 diabetes mellitus without complications: Secondary | ICD-10-CM

## 2015-11-11 LAB — CBC
HEMATOCRIT: 36 % (ref 36.0–46.0)
HEMOGLOBIN: 11.5 g/dL — AB (ref 12.0–15.0)
MCHC: 32 g/dL (ref 30.0–36.0)
MCV: 83.7 fl (ref 78.0–100.0)
PLATELETS: 309 10*3/uL (ref 150.0–400.0)
RBC: 4.3 Mil/uL (ref 3.87–5.11)
RDW: 13.3 % (ref 11.5–15.5)
WBC: 6.3 10*3/uL (ref 4.0–10.5)

## 2015-11-11 LAB — LIPID PANEL
CHOLESTEROL: 136 mg/dL (ref 0–200)
HDL: 42.8 mg/dL (ref >39.00)
LDL CALC: 78 mg/dL (ref 0–99)
NonHDL: 93.01
TRIGLYCERIDES: 73 mg/dL (ref 0.0–149.0)
Total CHOL/HDL Ratio: 3
VLDL: 14.6 mg/dL (ref 0.0–40.0)

## 2015-11-11 LAB — BASIC METABOLIC PANEL
BUN: 12 mg/dL (ref 6–23)
CHLORIDE: 106 meq/L (ref 96–112)
CO2: 27 mEq/L (ref 19–32)
Calcium: 9.7 mg/dL (ref 8.4–10.5)
Creatinine, Ser: 0.75 mg/dL (ref 0.40–1.20)
GFR: 102.11 mL/min (ref >60.00)
GLUCOSE: 140 mg/dL — AB (ref 70–99)
POTASSIUM: 4 meq/L (ref 3.5–5.1)
SODIUM: 142 meq/L (ref 135–145)

## 2015-11-11 MED ORDER — METFORMIN HCL ER (OSM) 1000 MG PO TB24: 2000.0000 mg | ORAL_TABLET | Freq: Every day | ORAL | 3 refills | Status: DC

## 2015-11-11 MED ORDER — LISINOPRIL 20 MG PO TABS: 20.0000 mg | ORAL_TABLET | Freq: Every day | ORAL | 3 refills | Status: DC

## 2015-11-11 MED ORDER — FUROSEMIDE 20 MG PO TABS: 20.0000 mg | ORAL_TABLET | Freq: Every day | ORAL | 3 refills | Status: DC

## 2015-11-11 MED FILL — LISINOPRIL 20 MG TABLET: 20 | 90 days supply | Qty: 90 | Fill #0

## 2015-11-11 MED FILL — METFORMIN HCL ER 1,000 MG T: 1000 | 30 days supply | Qty: 60 | Fill #0

## 2015-11-11 MED FILL — FUROSEMIDE 20 MG TABLET: 20 | 90 days supply | Qty: 90 | Fill #0

## 2015-11-11 NOTE — Progress Notes (Signed)
Pre visit review using our clinic review tool, if applicable. No additional management support is needed unless otherwise documented below in the visit note. 

## 2015-11-11 NOTE — Telephone Encounter (Signed)
Laura Marsh called from [place] requesting clarification on the Rx that was sent on Metformin as the pt was on the regular Metformin.  I advised Laura Marsh per Dr Julianne Rice note she changed her to the long acting Rx and gave her the instructions also.

## 2015-11-11 NOTE — Patient Instructions (Addendum)
BEFORE YOU LEAVE: -follow up: CPE in 3 months -labs  Lets try changing to the long acting metformin. Stop the regular metformin. Start with 1000mg  daily at dinner for 1 week, then increase to 2000mg  daily at dinner.  Take all of your medications every day as instructed.  We recommend the following healthy lifestyle: 1) Small portions - eat off of salad plate instead of dinner plate 2) Eat a healthy clean diet with avoidance of (less then 1 serving per week) sweets, processed foods, sweetened drinks, white starches, red meat, fast foods and sweets and consisting of: * 5-9 servings per day of fresh or frozen fruits and vegetables (not corn or potatoes, not dried or canned) *nuts and seeds, beans *olives and olive oil *small portions of lean meats such as fish and white chicken  *small portions of whole grains 3)Get at least 150 minutes of sweaty aerobic exercise per week 4)reduce stress - counseling, meditation, relaxation to balance other aspects of your life  We have ordered labs or studies at this visit. It can take up to 1-2 weeks for results and processing. IF results require follow up or explanation, we will call you with instructions. Clinically stable results will be released to your [facility] If you have not heard from [place] or cannot find your results in [facility] in 2 weeks please contact our office at [phone].  If you are not yet signed up for [facility], please consider signing up.

## 2015-11-13 NOTE — Addendum Note (Signed)
Addended by: Agnes Lawrence on: 11/13/2015 10:15 AM   Modules accepted: Orders

## 2015-12-04 DIAGNOSIS — L0291 Cutaneous abscess, unspecified: Secondary | ICD-10-CM | POA: Diagnosis not present

## 2015-12-04 MED FILL — DOXYCYCLINE HYCLATE 100 MG: 100 | 30 days supply | Qty: 60 | Fill #0

## 2015-12-16 NOTE — Addendum Note (Signed)
Addended by: Elmer Picker on: 12/16/2015 12:50 PM   Modules accepted: Orders

## 2015-12-16 NOTE — Addendum Note (Signed)
Addended by: Elmer Picker on: 12/16/2015 12:46 PM   Modules accepted: Orders

## 2015-12-17 ENCOUNTER — Other Ambulatory Visit: Payer: 59

## 2015-12-19 ENCOUNTER — Telehealth: Payer: Self-pay | Admitting: Family Medicine

## 2015-12-19 MED ORDER — FLUTICASONE PROPIONATE 50 MCG/ACT NA SUSP: 2.0000 | Freq: Every day | NASAL | 6 refills | Status: DC

## 2015-12-19 NOTE — Telephone Encounter (Signed)
Pt would like to have Rx Flonase called in so that the insurance company will pay for it.  Pharm:  [facility] outpt

## 2015-12-19 NOTE — Telephone Encounter (Signed)
Sent!

## 2015-12-26 DIAGNOSIS — M180 Bilateral primary osteoarthritis of first carpometacarpal joints: Secondary | ICD-10-CM | POA: Diagnosis not present

## 2015-12-26 DIAGNOSIS — M79645 Pain in left finger(s): Secondary | ICD-10-CM | POA: Diagnosis not present

## 2015-12-26 DIAGNOSIS — M67431 Ganglion, right wrist: Secondary | ICD-10-CM | POA: Diagnosis not present

## 2015-12-26 DIAGNOSIS — M67432 Ganglion, left wrist: Secondary | ICD-10-CM | POA: Diagnosis not present

## 2015-12-27 ENCOUNTER — Other Ambulatory Visit: Payer: Self-pay | Admitting: Orthopedic Surgery

## 2015-12-27 DIAGNOSIS — M25531 Pain in right wrist: Secondary | ICD-10-CM

## 2015-12-27 DIAGNOSIS — M67431 Ganglion, right wrist: Secondary | ICD-10-CM

## 2015-12-29 ENCOUNTER — Other Ambulatory Visit: Payer: Self-pay | Admitting: Family Medicine

## 2015-12-29 MED ORDER — PRAVASTATIN SODIUM 40 MG PO TABS: 40.0000 mg | ORAL_TABLET | Freq: Every day | ORAL | 1 refills | Status: DC

## 2015-12-29 MED FILL — PRAVASTATIN NA 40 MG TAB: 40 | 90 days supply | Qty: 90 | Fill #0

## 2016-01-01 ENCOUNTER — Other Ambulatory Visit (INDEPENDENT_AMBULATORY_CARE_PROVIDER_SITE_OTHER): Payer: 59

## 2016-01-01 DIAGNOSIS — R899 Unspecified abnormal finding in specimens from other organs, systems and tissues: Secondary | ICD-10-CM

## 2016-01-01 DIAGNOSIS — I10 Essential (primary) hypertension: Secondary | ICD-10-CM

## 2016-01-01 DIAGNOSIS — D649 Anemia, unspecified: Secondary | ICD-10-CM

## 2016-01-01 LAB — CBC WITH DIFFERENTIAL/PLATELET
Basophils Absolute: 0 10*3/uL (ref 0.0–0.1)
Basophils Relative: 0.6 % (ref 0.0–3.0)
EOS PCT: 3.6 % (ref 0.0–5.0)
Eosinophils Absolute: 0.2 10*3/uL (ref 0.0–0.7)
HCT: 36.2 % (ref 36.0–46.0)
HEMOGLOBIN: 11.7 g/dL — AB (ref 12.0–15.0)
LYMPHS PCT: 35.5 % (ref 12.0–46.0)
Lymphs Abs: 2.3 10*3/uL (ref 0.7–4.0)
MCHC: 32.3 g/dL (ref 30.0–36.0)
MCV: 82.3 fl (ref 78.0–100.0)
MONO ABS: 0.3 10*3/uL (ref 0.1–1.0)
MONOS PCT: 4.6 % (ref 3.0–12.0)
Neutro Abs: 3.6 10*3/uL (ref 1.4–7.7)
Neutrophils Relative %: 55.7 % (ref 43.0–77.0)
Platelets: 287 10*3/uL (ref 150.0–400.0)
RBC: 4.4 Mil/uL (ref 3.87–5.11)
RDW: 13.5 % (ref 11.5–15.5)
WBC: 6.5 10*3/uL (ref 4.0–10.5)

## 2016-01-01 LAB — VITAMIN B12: Vitamin B-12: 392 pg/mL (ref 211–911)

## 2016-01-01 LAB — FERRITIN: FERRITIN: 114.8 ng/mL (ref 10.0–291.0)

## 2016-01-02 ENCOUNTER — Telehealth: Payer: Self-pay | Admitting: Family Medicine

## 2016-01-02 ENCOUNTER — Other Ambulatory Visit: Payer: 59

## 2016-01-02 NOTE — Telephone Encounter (Signed)
Laura Marsh pt called you and would like to have a call back at 4:30 if possible.

## 2016-01-02 NOTE — Telephone Encounter (Signed)
See results note. 

## 2016-01-05 ENCOUNTER — Ambulatory Visit
Admission: RE | Admit: 2016-01-05 | Discharge: 2016-01-05 | Disposition: A | Discharge status: Home / Self Care | Payer: 59 | Source: Ambulatory Visit | Attending: Orthopedic Surgery | Admitting: Orthopedic Surgery

## 2016-01-05 DIAGNOSIS — R2231 Localized swelling, mass and lump, right upper limb: Secondary | ICD-10-CM | POA: Diagnosis not present

## 2016-01-05 DIAGNOSIS — M67431 Ganglion, right wrist: Secondary | ICD-10-CM

## 2016-01-05 DIAGNOSIS — M25531 Pain in right wrist: Secondary | ICD-10-CM

## 2016-01-06 DIAGNOSIS — M79644 Pain in right finger(s): Secondary | ICD-10-CM | POA: Diagnosis not present

## 2016-01-06 DIAGNOSIS — M180 Bilateral primary osteoarthritis of first carpometacarpal joints: Secondary | ICD-10-CM | POA: Diagnosis not present

## 2016-01-14 MED FILL — METFORMIN HCL ER 1,000 MG T: 1000 | 30 days supply | Qty: 60 | Fill #1

## 2016-02-02 MED FILL — TRIAMTERENE-HCTZ 37.5-25 MG: 37.5-25 | 90 days supply | Qty: 90 | Fill #1

## 2016-02-04 MED FILL — JANUVIA 100 MG TABLET: 100 | 90 days supply | Qty: 90 | Fill #1

## 2016-02-13 ENCOUNTER — Ambulatory Visit: Payer: 59 | Admitting: Family Medicine

## 2016-02-19 ENCOUNTER — Ambulatory Visit: Payer: 59 | Admitting: Family Medicine

## 2016-03-09 MED FILL — METFORMIN ER 1,000 MG OSM-T: 1000 | 30 days supply | Qty: 60 | Fill #2

## 2016-03-09 MED FILL — LISINOPRIL 20 MG TABLET: 20 | 90 days supply | Qty: 90 | Fill #1

## 2016-04-16 ENCOUNTER — Telehealth: Payer: Self-pay | Admitting: Family Medicine

## 2016-04-16 DIAGNOSIS — E119 Type 2 diabetes mellitus without complications: Secondary | ICD-10-CM

## 2016-04-16 NOTE — Telephone Encounter (Signed)
I called the pt and she stated she was returning my call and did not know what I was calling about.  I advised the pt I was calling in reference to the last note from her previous lab tests in which she was to let us know if she wanted to have the referral to endo, be seen here or a diabetes educator.  Patient stated she would like to see the diabetes educator and she is aware the referral was placed and someone will call with appt info.

## 2016-04-16 NOTE — Telephone Encounter (Signed)
Laura Marsh pt would like to speak with you on a personal matter would not elaborate on it.

## 2016-05-06 ENCOUNTER — Other Ambulatory Visit: Payer: Self-pay | Admitting: Adult Health

## 2016-05-06 DIAGNOSIS — Z1231 Encounter for screening mammogram for malignant neoplasm of breast: Secondary | ICD-10-CM | POA: Diagnosis not present

## 2016-05-06 LAB — HM MAMMOGRAPHY

## 2016-05-06 NOTE — Telephone Encounter (Signed)
Pt request refill  triamterene-hydrochlorothiazide (MAXZIDE-25) 37.5-25 MG per tablet  pravastatin (PRAVACHOL) 40 MG tablet  90 day  [place] outpt pharm

## 2016-05-07 ENCOUNTER — Encounter: Payer: Self-pay | Admitting: Family Medicine

## 2016-05-07 MED FILL — TRIAMTERENE-HCTZ 37.5-25 MG: 37.5-25 | 90 days supply | Qty: 90 | Fill #0

## 2016-05-19 MED FILL — PRAVASTATIN NA 40 MG TAB: 40 | 90 days supply | Qty: 90 | Fill #1

## 2016-06-07 MED FILL — LISINOPRIL 20 MG TABLET: 20 | 90 days supply | Qty: 90 | Fill #2

## 2016-06-17 NOTE — Progress Notes (Signed)
HPI:  Here for CPE:  -Concerns and/or follow up today: none  PMH poor compliance with recommended care and follow up, diabetes, HTN, HLD, mild anemia and obesity. Not taking Januvia. Only taking metformin once daily.Not seen in 7 months as did not follow up as advised. Due for labs, flu vaccine, eye exam, hep C screening. New complaints of R lateral leg pain - x1-2 months, focal, nonradiating, worse with activity, no hx injury, no waekness/numbness/back pain/fevers/malaise. She thought it was Januvia so not taking [place] - pain still there. Chronic nasal congestion with spring allergies. Wants flonase refill. Seeing eye doctor today.   -Diet: variety of foods, balance and well rounded, larger portion sizes - trying to improve and has cut back on sweets. Saw wellness coach today.  -Exercise: no regular exercise but trying to get 3000-4000 steps daily.  -Taking folic acid, vitamin D or calcium: no  -Diabetes and Dyslipidemia Screening: not fasting  -Vaccines: UTD, refuses pneumonia vaccine  -pap history: UTD, seeing gyn, wants to transfer to me next year  -sexual activity: yes, female partner, no new partners  -wants STI testing (Hep C if born [birth year]): no  -FH breast, colon or ovarian ca: see FH Last mammogram: done, saw gyn for breast health Last colon cancer screening: UTD  -Alcohol, Tobacco, drug use: see social history  Review of Systems - no fevers, unintentional weight loss, vision loss, hearing loss, chest pain, sob, hemoptysis, melena, hematochezia, hematuria, genital discharge, changing or concerning skin lesions, bleeding, bruising, loc, thoughts of self harm or SI  Past Medical History:  Diagnosis Date  . Diabetes mellitus   . Elevated cholesterol   . Hypertension     Past Surgical History:  Procedure Laterality Date  . ABDOMINAL HYSTERECTOMY      Family History  Problem Relation Age of Onset  . Diabetes Mother   . Congestive Heart Failure Mother      Social History   Social History  . Marital status: Divorced    Spouse name: N/A  . Number of children: N/A  . Years of education: N/A   Social History Main Topics  . Smoking status: Never Smoker  . Smokeless tobacco: Never Used  . Alcohol use No  . Drug use: No  . Sexual activity: Not Asked   Other Topics Concern  . None   Social History Narrative  . None     Current Outpatient Prescriptions:  .  Alcohol Swabs PADS, Use as directed., Disp: 100 each, Rfl: 12 .  Ascorbic Acid (VITAMIN C PO), Take by mouth., Disp: , Rfl:  .  fluticasone (FLONASE) 50 MCG/ACT nasal spray, Place 2 sprays into both nostrils daily., Disp: 16 g, Rfl: 6 .  glucose blood (TRUETEST TEST) test strip, TEST TWICE WEEKLY., Disp: 100 each, Rfl: 3 .  lisinopril (PRINIVIL,ZESTRIL) 20 MG tablet, Take 1 tablet (20 mg total) by mouth daily., Disp: 90 tablet, Rfl: 3 .  metformin (FORTAMET) 1000 MG (OSM) 24 hr tablet, Take 2 tablets (2,000 mg total) by mouth daily., Disp: 60 tablet, Rfl: 3 .  Multiple Vitamin (MULTIVITAMIN) capsule, Take 1 capsule by mouth daily., Disp: , Rfl:  .  pravastatin (PRAVACHOL) 40 MG tablet, Take 1 tablet (40 mg total) by mouth daily., Disp: 90 tablet, Rfl: 1 .  sitaGLIPtin (JANUVIA) 100 MG tablet, Take 1 tablet (100 mg total) by mouth daily., Disp: 90 tablet, Rfl: 1 .  triamterene-hydrochlorothiazide (MAXZIDE-25) 37.5-25 MG tablet, TAKE 1 TABLET BY MOUTH ONCE DAILY, Disp: 90 tablet, Rfl:  0 .  TRUEPLUS LANCETS 30G MISC, USE AS DIRECTED 2 TIMES A WEEK, Disp: 100 each, Rfl: 3  EXAM:  Vitals:   06/18/16 1129  BP: 120/78  Pulse: (!) 105  Temp: 98.2 F (36.8 C)    GENERAL: vitals reviewed and listed below, alert, oriented, appears well hydrated and in no acute distress  HEENT: head atraumatic, PERRLA, normal appearance of eyes, ears, nose and mouth. moist mucus membranes., normal appearance of ear canals and TMs, clear nasal congestion, mild post oropharyngeal erythema with PND, no  tonsillar edema or exudate, no sinus TTP  NECK: supple, no masses or lymphadenopathy  LUNGS: clear to auscultation bilaterally, no rales, rhonchi or wheeze  CV: HRRR, no peripheral edema or cyanosis, normal pedal pulses  BREAST: sees gyn  GU: sees gyn   ABDOMEN: bowel sounds normal, soft, non tender to palpation, no masses, no rebound or guarding  SKIN: no rash or abnormal lesions  MS: normal gait, moves all extremities normally, normal inspection legs, back, TTP over greater trochanteric bursa region R, O/w normal exam and NV intact distal  NEURO: normal gait, speech and thought processing grossly intact, muscle tone grossly intact throughout  PSYCH: normal affect, pleasant and cooperative  ASSESSMENT AND PLAN:  Discussed the following assessment and plan:  Encounter for preventive health examination  Essential hypertension - Plan: Basic metabolic panel, CBC  Type 2 diabetes mellitus without complication, without long-term current use of insulin (HCC) - Plan: Hemoglobin A1c  Hyperlipidemia, unspecified hyperlipidemia type - Plan: HDL cholesterol, Cholesterol, total  Lateral pain of right hip  Acute seasonal allergic rhinitis due to other allergen  Other problems related to lifestyle - Plan: Hepatitis C antibody  -Discussed and advised all [place] preventive services health task force level A and B recommendations for age, sex and risks.  -Advised at least 150 minutes of exercise per week and a healthy diet with avoidance of (less then 1 serving per week) processed foods, white starches, red meat, fast foods and sweets and consisting of: * 5-9 servings of fresh fruits and vegetables (not corn or potatoes) *nuts and seeds, beans *olives and olive oil *lean meats such as fish and white chicken  *whole grains  -labs, studies and vaccines per orders this encounter  -for hip pain, -we discussed possible serious and likely etiologies, workup and treatment, treatment risks and  return precautions; suspect greater trochanteric bursitis -after this discussion, Shelbylynn opted for HEP, ice, OTC analgesic if needed -follow up advised 1 month -of course, we advised Joyell  to return or notify a doctor immediately if symptoms worsen or persist or new concerns arise.  For PND, trial flonase with antihistamine  Restart Januvia   Orders Placed This Encounter  Procedures  . Hepatitis C antibody  . Basic metabolic panel  . CBC  . HDL cholesterol  . Cholesterol, total  . Hemoglobin A1c    Patient advised to return to clinic immediately if symptoms worsen or persist or new concerns.  Patient Instructions  BEFORE YOU LEAVE: -follow up: 1 month for the hip and allergies -greater trochanteric bursa exercises -labs  Do the exercises for the leg 4 days per week and ice after exercise or if pain.  flonase 2 sprays each nostril daily for 1 month and daily antihistamine for the allergies.  Start taking the Januvia every day (unless diabetes lab, Hgba1c)  less then 7)  We have ordered labs or studies at this visit. It can take up to 1-2 weeks for results  and processing. IF results require follow up or explanation, we will call you with instructions. Clinically stable results will be released to your [facility]. If you have not heard from [place] or cannot find your results in [facility] in 2 weeks please contact our office at [phone].  If you are not yet signed up for [facility], please consider signing up.   We recommend the following healthy lifestyle for LIFE: 1) Small portions.   Tip: eat off of a salad plate instead of a dinner plate.  Tip: It is ok to feel hungry after a meal - that likely means you ate an appropriate portion.  Tip: if you need more or a snack choose fruits, veggies and/or a handful of nuts or seeds.  2) Eat a healthy clean diet.  * Tip: Avoid (less then 1 serving per week): processed foods, sweets, sweetened drinks, white starches (rice, flour, bread,  potatoes, pasta, etc), red meat, fast foods, butter  *Tip: CHOOSE instead   * 5-9 servings per day of fresh or frozen fruits and vegetables (but not corn, potatoes, bananas, canned or dried fruit)   *nuts and seeds, beans   *olives and olive oil   *small portions of lean meats such as fish and white chicken    *small portions of whole grains  3)Get at least 150 minutes of sweaty aerobic exercise per week.  4)Reduce stress - consider counseling, meditation and relaxation to balance other aspects of your life.   WE NOW OFFER   [facility] FAST TRACK!!!  SAME DAY Appointments for ACUTE CARE  Such as: Sprains, Injuries, cuts, abrasions, rashes, muscle pain, joint pain, back pain Colds, flu, sore throats, headache, allergies, cough, fever  Ear pain, sinus and eye infections Abdominal pain, nausea, vomiting, diarrhea, upset stomach Animal/insect bites  3 Easy Ways to Schedule: Walk-In Scheduling Call in scheduling Mychart Sign-up: [email or web address]               No Follow-up on file.  Colin Benton R., DO

## 2016-06-18 ENCOUNTER — Encounter: Payer: Self-pay | Admitting: Family Medicine

## 2016-06-18 ENCOUNTER — Encounter: Payer: 59 | Attending: Family Medicine | Admitting: Registered"

## 2016-06-18 ENCOUNTER — Other Ambulatory Visit: Payer: Self-pay | Admitting: Family Medicine

## 2016-06-18 ENCOUNTER — Ambulatory Visit (INDEPENDENT_AMBULATORY_CARE_PROVIDER_SITE_OTHER): Payer: 59 | Admitting: Family Medicine

## 2016-06-18 VITALS — BP 120/78 | HR 105 | Temp 98.2°F | Ht 63.0 in | Wt 195.4 lb

## 2016-06-18 DIAGNOSIS — E785 Hyperlipidemia, unspecified: Secondary | ICD-10-CM

## 2016-06-18 DIAGNOSIS — Z0000 Encounter for general adult medical examination without abnormal findings: Secondary | ICD-10-CM | POA: Diagnosis not present

## 2016-06-18 DIAGNOSIS — Z713 Dietary counseling and surveillance: Secondary | ICD-10-CM | POA: Diagnosis not present

## 2016-06-18 DIAGNOSIS — I10 Essential (primary) hypertension: Secondary | ICD-10-CM | POA: Diagnosis not present

## 2016-06-18 DIAGNOSIS — E119 Type 2 diabetes mellitus without complications: Secondary | ICD-10-CM | POA: Diagnosis not present

## 2016-06-18 DIAGNOSIS — Z7289 Other problems related to lifestyle: Secondary | ICD-10-CM

## 2016-06-18 DIAGNOSIS — J302 Other seasonal allergic rhinitis: Secondary | ICD-10-CM | POA: Diagnosis not present

## 2016-06-18 DIAGNOSIS — H524 Presbyopia: Secondary | ICD-10-CM | POA: Diagnosis not present

## 2016-06-18 DIAGNOSIS — M25551 Pain in right hip: Secondary | ICD-10-CM

## 2016-06-18 DIAGNOSIS — H52221 Regular astigmatism, right eye: Secondary | ICD-10-CM | POA: Diagnosis not present

## 2016-06-18 LAB — CBC
HCT: 39 % (ref 36.0–46.0)
Hemoglobin: 12.6 g/dL (ref 12.0–15.0)
MCHC: 32.4 g/dL (ref 30.0–36.0)
MCV: 79.3 fl (ref 78.0–100.0)
Platelets: 315 K/uL (ref 150.0–400.0)
RBC: 4.92 Mil/uL (ref 3.87–5.11)
RDW: 13.3 % (ref 11.5–15.5)
WBC: 6.9 K/uL (ref 4.0–10.5)

## 2016-06-18 LAB — BASIC METABOLIC PANEL
BUN: 15 mg/dL (ref 6–23)
CALCIUM: 10.6 mg/dL — AB (ref 8.4–10.5)
CO2: 26 meq/L (ref 19–32)
CREATININE: 0.83 mg/dL (ref 0.40–1.20)
Chloride: 98 mEq/L (ref 96–112)
GFR: 90.65 mL/min (ref >60.00)
Glucose, Bld: 227 mg/dL — ABNORMAL HIGH (ref 70–99)
Potassium: 3.9 mEq/L (ref 3.5–5.1)
SODIUM: 135 meq/L (ref 135–145)

## 2016-06-18 LAB — HEMOGLOBIN A1C: Hgb A1c MFr Bld: 9.1 % — ABNORMAL HIGH (ref 4.6–6.5)

## 2016-06-18 LAB — HDL CHOLESTEROL: HDL: 46 mg/dL

## 2016-06-18 LAB — HM DIABETES EYE EXAM

## 2016-06-18 LAB — CHOLESTEROL, TOTAL: CHOLESTEROL: 162 mg/dL (ref 0–200)

## 2016-06-18 MED ORDER — FLUTICASONE PROPIONATE 50 MCG/ACT NA SUSP: 2.0000 | Freq: Every day | NASAL | 6 refills | Status: DC

## 2016-06-18 MED FILL — FLUTICASONE PROP 50 MCG SPR: 50 | 30 days supply | Qty: 16 | Fill #0

## 2016-06-18 NOTE — Progress Notes (Signed)
Pre visit review using our clinic review tool, if applicable. No additional management support is needed unless otherwise documented below in the visit note. 

## 2016-06-18 NOTE — Progress Notes (Signed)
Diabetes Self-Management Education  Visit Type: First/Initial  Appt. Start Time: 0810 Appt. End Time: 7371  06/18/2016  [name], identified by name and date of birth, is a 61 y.o. female with a diagnosis of Diabetes: Type 2.   ASSESSMENT Patient states she has been conscious of food choices to help manage blood sugar and reports cutting back on sweets and bread. She reports she has lost about 10 lbs. She is not taking medication as prescribed due to side effects of loose stools (metformin) and nausea (Januvia), and has appt with her MD today to discuss. She tolerates up to 500 mg bid Metformin; extended release didn't make a difference.   Height 5\' 3"  (1.6 m), weight 194 lb 6.4 oz (88.2 kg). Body mass index is 34.44 kg/m.      Diabetes Self-Management Education - 06/18/16 0820      Visit Information   Visit Type First/Initial     Initial Visit   Diabetes Type Type 2   Are you currently following a meal plan? No   Are you taking your medications as prescribed? No  pt states going to see PCP today because meds are making her sick   Date Diagnosed 2010     Health Coping   How would you rate your overall health? Good     Psychosocial Assessment   Patient Belief/Attitude about Diabetes Other (comment)  having difficulty with meds   How often do you need to have someone help you when you read instructions, pamphlets, or other written materials from your doctor or pharmacy? 1 - Never   What is the last grade level you completed in school? 12 + 3     Complications   Last HgB A1C per patient/outside source 7.5 %  8 mo ago per chart   How often do you check your blood sugar? --  1 x week   Fasting Blood glucose range (mg/dL) 130-179  135-160   Number of hypoglycemic episodes per month 0   Number of hyperglycemic episodes per week 2   Have you had a dilated eye exam in the past 12 months? Yes   Have you had a dental exam in the past 12 months? Yes   Are you checking  your feet? Yes   How many days per week are you checking your feet? 3     Dietary Intake   Breakfast PB crackers & coffee OR sausage and egg biscuit   Snack (morning) none OR unsweetened applesauce OR sometimes choc almonds   Lunch sandwich, diet coke OR occassionally salad fried chicken   Snack (afternoon) nuts (pecans)   Dinner chicken, potatoes with gravy, salad    Snack (evening) none OR fruit   Beverage(s) coffee, tea, diet soda, water      Exercise   Exercise Type Light (walking / raking leaves)   How many days per week to you exercise? 5   How many minutes per day do you exercise? 30   Total minutes per week of exercise 150     Patient Education   Previous Diabetes Education Yes (please comment)  2013   Disease state  Definition of diabetes, type 1 and 2, and the diagnosis of diabetes   Nutrition management  Role of diet in the treatment of diabetes and the relationship between the three main macronutrients and blood glucose level;Food label reading, portion sizes and measuring food.;Carbohydrate counting   Monitoring Identified appropriate SMBG and/or A1C goals.   Chronic complications Relationship  between chronic complications and blood glucose control;Lipid levels, blood glucose control and heart disease;Retinopathy and reason for yearly dilated eye exams     Individualized Goals (developed by patient)   Nutrition General guidelines for healthy choices and portions discussed     Outcomes   Expected Outcomes Demonstrated interest in learning. Expect positive outcomes   Future DMSE 4-6 wks   Program Status Not Completed      Individualized Plan for Diabetes Self-Management Training:   Learning Objective:  Patient will have a greater understanding of diabetes self-management. Patient education plan is to attend individual and/or group sessions per assessed needs and concerns.  Patient Instructions  Plan:  Aim for 2-3 Carb Choices per meal (30-45 grams) +/- 1 either  way  Aim for 0-2 Carbs per snack if hungry  Include protein in moderation with your meals and snacks Consider reading food labels for Total Carbohydrate Continue walking and consider getting physical activity daily as tolerated Continue checking BG as directed by MD  Ask MD any questions you have about medication   Return in 4 weeks to continue DSME  Items to cover next visit:  Practice carb counting to ensure understanding   Review Types of Fat  Provide education on exercise and encourage increasing either intensity or duration to get most benefit.  Medication adjustments make improve bowel symptoms, but If patient is still experiencing loose stools, can make recommendations (BRAT) to help with symptoms.   Expected Outcomes:  Demonstrated interest in learning. Expect positive outcomes  Education material provided: Living Well with Diabetes, Food label handouts, A1C conversion sheet, My Plate, Snack sheet and Carbohydrate counting sheet  If problems or questions, patient to contact team via:  Phone and Email  Future DSME appointment: 4-6 wks

## 2016-06-18 NOTE — Patient Instructions (Signed)
BEFORE YOU LEAVE: -follow up: 1 month for the hip and allergies -greater trochanteric bursa exercises -labs  Do the exercises for the leg 4 days per week and ice after exercise or if pain.  flonase 2 sprays each nostril daily for 1 month and daily antihistamine for the allergies.  Start taking the Januvia every day (unless diabetes lab, Hgba1c)  less then 7)  We have ordered labs or studies at this visit. It can take up to 1-2 weeks for results and processing. IF results require follow up or explanation, we will call you with instructions. Clinically stable results will be released to your [facility]. If you have not heard from [place] or cannot find your results in [facility] in 2 weeks please contact our office at [phone].  If you are not yet signed up for [facility], please consider signing up.   We recommend the following healthy lifestyle for LIFE: 1) Small portions.   Tip: eat off of a salad plate instead of a dinner plate.  Tip: It is ok to feel hungry after a meal - that likely means you ate an appropriate portion.  Tip: if you need more or a snack choose fruits, veggies and/or a handful of nuts or seeds.  2) Eat a healthy clean diet.  * Tip: Avoid (less then 1 serving per week): processed foods, sweets, sweetened drinks, white starches (rice, flour, bread, potatoes, pasta, etc), red meat, fast foods, butter  *Tip: CHOOSE instead   * 5-9 servings per day of fresh or frozen fruits and vegetables (but not corn, potatoes, bananas, canned or dried fruit)   *nuts and seeds, beans   *olives and olive oil   *small portions of lean meats such as fish and white chicken    *small portions of whole grains  3)Get at least 150 minutes of sweaty aerobic exercise per week.  4)Reduce stress - consider counseling, meditation and relaxation to balance other aspects of your life.   WE NOW OFFER   [facility] FAST TRACK!!!  SAME DAY Appointments for ACUTE CARE  Such as: Sprains,  Injuries, cuts, abrasions, rashes, muscle pain, joint pain, back pain Colds, flu, sore throats, headache, allergies, cough, fever  Ear pain, sinus and eye infections Abdominal pain, nausea, vomiting, diarrhea, upset stomach Animal/insect bites  3 Easy Ways to Schedule: Walk-In Scheduling Call in scheduling Mychart Sign-up: [email or web address]

## 2016-06-18 NOTE — Patient Instructions (Signed)
Plan:  Aim for 2-3 Carb Choices per meal (30-45 grams) +/- 1 either way  Aim for 0-2 Carbs per snack if hungry  Include protein in moderation with your meals and snacks Consider reading food labels for Total Carbohydrate Continue walking and consider getting physical activity daily as tolerated Continue checking BG as directed by MD  Ask MD any questions you have about medication

## 2016-06-18 NOTE — Addendum Note (Signed)
Addended by: Agnes Lawrence on: 06/18/2016 01:02 PM   Modules accepted: Orders

## 2016-06-19 LAB — HEPATITIS C ANTIBODY: HCV AB: NEGATIVE

## 2016-06-21 ENCOUNTER — Encounter: Payer: Self-pay | Admitting: Family Medicine

## 2016-07-02 MED FILL — JANUVIA 100 MG TABLET: 100 | 90 days supply | Qty: 90 | Fill #0

## 2016-07-23 ENCOUNTER — Ambulatory Visit: Payer: 59 | Admitting: Registered"

## 2016-07-23 ENCOUNTER — Ambulatory Visit: Payer: 59 | Admitting: Family Medicine

## 2016-08-12 MED FILL — metFORMIN HCL 1000 MG TABS: 1000 | 90 days supply | Qty: 180 | Fill #1

## 2016-08-13 ENCOUNTER — Other Ambulatory Visit: Payer: Self-pay | Admitting: Family Medicine

## 2016-08-24 MED FILL — TRIAMTERENE-HCTZ 37.5-25 MG: 37.5-25 | 90 days supply | Qty: 90 | Fill #0

## 2016-09-01 MED FILL — LISINOPRIL 20 MG TAB: 20 | 90 days supply | Qty: 90 | Fill #3

## 2016-09-17 ENCOUNTER — Encounter: Payer: 59 | Attending: Family Medicine | Admitting: Registered"

## 2016-09-17 DIAGNOSIS — E119 Type 2 diabetes mellitus without complications: Secondary | ICD-10-CM | POA: Insufficient documentation

## 2016-09-17 DIAGNOSIS — E118 Type 2 diabetes mellitus with unspecified complications: Secondary | ICD-10-CM

## 2016-09-17 DIAGNOSIS — Z713 Dietary counseling and surveillance: Secondary | ICD-10-CM | POA: Diagnosis not present

## 2016-09-17 NOTE — Progress Notes (Signed)
Diabetes Self-Management Education  Visit Type: Follow-up  Appt. Start Time: 0920 Appt. End Time: 1000  09/17/2016  [name], identified by name and date of birth, is a 61 y.o. female with a diagnosis of Diabetes:  .   ASSESSMENT Pt states she has not started carb counting, but feels she has been doing better because she is eating less. Pt states she wants to weight 170 lb. Today's weight is 192.4 lb, down 3 lbs since visit 3 mo ago. RD advised that patient focus more on health outcomes rather than the number on the scale.  Height 5\' 3"  (1.6 m), weight 192 lb 6.4 oz (87.3 kg). Body mass index is 34.08 kg/m.     Diabetes Self-Management Education - 09/17/16 1334      Visit Information   Visit Type Follow-up     Complications   Last HgB A1C per patient/outside source --  patient is getting rechecked soon     Dietary Intake   Breakfast egg & cheese biscuit   Snack (morning) none - is hungry but usually doesn't have break at work   [employer] sandwich    Snack (afternoon) none   Dinner vegetabes (usually tries to get in), cube steak, creamed potatoes   Snack (evening) fruit    Beverage(s) coffee, tea, diet soda, 1/2&1/2 sweet/unsweet tea     Exercise   Exercise Type ADL's     Patient Education   Nutrition management  Carbohydrate counting   Psychosocial adjustment Worked with patient to identify barriers to care and solutions;Role of stress on diabetes     Individualized Goals (developed by patient)   Nutrition General guidelines for healthy choices and portions discussed     Patient Self-Evaluation of Goals - Patient rates self as meeting previously set goals (% of time)   Nutrition 25 - 50%     Outcomes   Expected Outcomes Demonstrated interest in learning. Expect positive outcomes   Future DMSE PRN   Program Status Not Completed     Subsequent Visit   Since your last visit have you continued or begun to take your medications as prescribed? Yes  Pt  states her doctor cut back on metformin to once per day due to side effects   Since your last visit have you had your blood pressure checked? Yes   Is your most recent blood pressure lower, unchanged, or higher since your last visit? Lower   Since your last visit have you experienced any weight changes? Loss   Weight Loss (lbs) 3    Individualized Plan for Diabetes Self-Management Training:   Learning Objective:  Patient will have a greater understanding of diabetes self-management. Patient education plan is to attend individual and/or group sessions per assessed needs and concerns.  Patient Instructions   Consider including a handful nuts for snacks and when you have fruit  Consider having a muffin instead of biscuit with breakfast sandwich  Continue cutting down on the sweet tea - Good job getting started on this!  Consider looking into some ways to reduce stress. Some ideas could be peaceful sounds, deep breathing, music, yoga   Expected Outcomes:  Demonstrated interest in learning. Expect positive outcomes  Education material provided: Snack sheet  If problems or questions, patient to contact team via:  Phone  Future DSME appointment: PRN

## 2016-09-17 NOTE — Patient Instructions (Addendum)
   Consider including a handful nuts for snacks and when you have fruit  Consider having a muffin instead of biscuit with breakfast sandwich  Continue cutting down on the sweet tea - Good job getting started on this!  Consider looking into some ways to reduce stress. Some ideas could be peaceful sounds, deep breathing, music, yoga

## 2016-09-29 MED FILL — JANUVIA 100 MG TABLET: 100 | 90 days supply | Qty: 90 | Fill #1

## 2016-11-09 ENCOUNTER — Other Ambulatory Visit: Payer: Self-pay | Admitting: Family Medicine

## 2016-11-09 MED FILL — metFORMIN HCL 1000 MG TABS: 1000 | 30 days supply | Qty: 60 | Fill #0

## 2016-11-30 ENCOUNTER — Other Ambulatory Visit: Payer: Self-pay | Admitting: Family Medicine

## 2016-11-30 MED FILL — TRIAMTERENE-HCTZ 37.5-25 MG: 37.5-25 | 30 days supply | Qty: 30 | Fill #0

## 2016-11-30 MED FILL — LISINOPRIL 20 MG TAB: 20 | 30 days supply | Qty: 30 | Fill #0

## 2016-12-03 ENCOUNTER — Other Ambulatory Visit: Payer: Self-pay | Admitting: *Deleted

## 2016-12-03 ENCOUNTER — Encounter: Payer: Self-pay | Admitting: Family Medicine

## 2016-12-03 ENCOUNTER — Ambulatory Visit (INDEPENDENT_AMBULATORY_CARE_PROVIDER_SITE_OTHER): Payer: 59 | Admitting: Family Medicine

## 2016-12-03 VITALS — BP 100/60 | HR 100 | Temp 98.3°F | Ht 63.0 in | Wt 190.6 lb

## 2016-12-03 DIAGNOSIS — E785 Hyperlipidemia, unspecified: Secondary | ICD-10-CM

## 2016-12-03 DIAGNOSIS — I10 Essential (primary) hypertension: Secondary | ICD-10-CM | POA: Diagnosis not present

## 2016-12-03 DIAGNOSIS — E1169 Type 2 diabetes mellitus with other specified complication: Secondary | ICD-10-CM | POA: Diagnosis not present

## 2016-12-03 DIAGNOSIS — E1159 Type 2 diabetes mellitus with other circulatory complications: Secondary | ICD-10-CM

## 2016-12-03 DIAGNOSIS — E119 Type 2 diabetes mellitus without complications: Secondary | ICD-10-CM

## 2016-12-03 DIAGNOSIS — I152 Hypertension secondary to endocrine disorders: Secondary | ICD-10-CM

## 2016-12-03 LAB — BASIC METABOLIC PANEL
BUN: 15 mg/dL (ref 6–23)
CHLORIDE: 102 meq/L (ref 96–112)
CO2: 29 mEq/L (ref 19–32)
Calcium: 10.8 mg/dL — ABNORMAL HIGH (ref 8.4–10.5)
Creatinine, Ser: 0.8 mg/dL (ref 0.40–1.20)
GFR: 94.43 mL/min (ref >60.00)
Glucose, Bld: 152 mg/dL — ABNORMAL HIGH (ref 70–99)
POTASSIUM: 4 meq/L (ref 3.5–5.1)
SODIUM: 139 meq/L (ref 135–145)

## 2016-12-03 LAB — CBC
HCT: 38.6 % (ref 36.0–46.0)
Hemoglobin: 12.2 g/dL (ref 12.0–15.0)
MCHC: 31.5 g/dL (ref 30.0–36.0)
MCV: 79.4 fl (ref 78.0–100.0)
Platelets: 280 10*3/uL (ref 150.0–400.0)
RBC: 4.86 Mil/uL (ref 3.87–5.11)
RDW: 13.1 % (ref 11.5–15.5)
WBC: 6.8 10*3/uL (ref 4.0–10.5)

## 2016-12-03 LAB — HEMOGLOBIN A1C: HEMOGLOBIN A1C: 8.1 % — AB (ref 4.6–6.5)

## 2016-12-03 MED ORDER — FREESTYLE LIBRE SENSOR SYSTEM MISC: 3 refills | Status: DC

## 2016-12-03 MED ORDER — FREESTYLE LIBRE SENSOR SYSTEM MISC: 0 refills | Status: DC

## 2016-12-03 MED ORDER — FREESTYLE LIBRE READER DEVI: 1.0000 | 0 refills | Status: DC

## 2016-12-03 NOTE — Progress Notes (Signed)
HPI:  Laura Marsh is a pleasant 61 year old here for an acute visit for follow-up for her diabetes. She has a past medical history significant for diabetes, hypertension, hyperlipidemia, obesity and poor compliance.  Diabetes mellitus: -Medications include: Metformin 1000 mg daily  ( reports does not tolerate higher dose); Januvia 100 mg daily -Poor compliance in the past -Reports she did diabetes education which was mildly helpful -She prefers to avoid more medications -Poor diet, lots of sweets in diet products along with lots of simple starches - she feels like it would be very hard to change, but she prefers to try to change diet rather than add medications  Hypertension/hyperlipidemia/obesity: -Poor diet -No regular exercise -Is trying to get about 4000 steps per day at work -She prefers to avoid more medications -current meds: lisinopril, pravastatin, triamterene-hydrochlorothiazide  ROS: See pertinent positives and negatives per HPI.  Past Medical History:  Diagnosis Date  . Diabetes mellitus   . Elevated cholesterol   . Hypertension     Past Surgical History:  Procedure Laterality Date  . ABDOMINAL HYSTERECTOMY      Family History  Problem Relation Age of Onset  . Diabetes Mother   . Congestive Heart Failure Mother     Social History   Social History  . Marital status: Divorced    Spouse name: N/A  . Number of children: N/A  . Years of education: N/A   Social History Main Topics  . Smoking status: Never Smoker  . Smokeless tobacco: Never Used  . Alcohol use No  . Drug use: No  . Sexual activity: Not Asked   Other Topics Concern  . None   Social History Narrative  . None     Current Outpatient Prescriptions:  .  Alcohol Swabs PADS, Use as directed., Disp: 100 each, Rfl: 12 .  fluticasone (FLONASE) 50 MCG/ACT nasal spray, Place 2 sprays into both nostrils daily., Disp: 16 g, Rfl: 6 .  glucose blood (TRUETEST TEST) test strip, TEST TWICE  WEEKLY., Disp: 100 each, Rfl: 3 .  JANUVIA 100 MG tablet, TAKE 1 TABLET BY MOUTH ONCE DAILY, Disp: 90 tablet, Rfl: 1 .  lisinopril (PRINIVIL,ZESTRIL) 20 MG tablet, TAKE 1 TABLET BY MOUTH ONCE DAILY, Disp: 30 tablet, Rfl: 0 .  metFORMIN (GLUCOPHAGE) 1000 MG tablet, TAKE 1 TABLET BY MOUTH TWICE DAILY WITH MEALS, Disp: 60 tablet, Rfl: 0 .  pravastatin (PRAVACHOL) 40 MG tablet, Take 1 tablet (40 mg total) by mouth daily., Disp: 90 tablet, Rfl: 1 .  triamterene-hydrochlorothiazide (MAXZIDE-25) 37.5-25 MG tablet, TAKE 1 TABLET BY MOUTH ONCE DAILY, Disp: 30 tablet, Rfl: 0 .  TRUEPLUS LANCETS 30G MISC, USE AS DIRECTED 2 TIMES A WEEK, Disp: 100 each, Rfl: 3 .  Continuous Blood Gluc Sensor ([place]) MISC, Use as directed to check blood sugar once a day, Disp: 1 each, Rfl: 0  EXAM:  Vitals:   12/03/16 1119  BP: 100/60  Pulse: 100  Temp: 98.3 F (36.8 C)    Body mass index is 33.76 kg/m.  GENERAL: vitals reviewed and listed above, alert, oriented, appears well hydrated and in no acute distress  HEENT: atraumatic, conjunttiva clear, no obvious abnormalities on inspection of external nose and ears  NECK: no obvious masses on inspection  LUNGS: clear to auscultation bilaterally, no wheezes, rales or rhonchi, good air movement  CV: HRRR, no peripheral edema  MS: moves all extremities without noticeable abnormality  PSYCH: pleasant and cooperative, no obvious depression or anxiety  Diabetic  foot exam done  ASSESSMENT AND PLAN:  Discussed the following assessment and plan:  Type 2 diabetes mellitus without complication, without long-term current use of insulin (HCC) - Plan: Hemoglobin A1c  Hyperlipidemia associated with type 2 diabetes mellitus ([place])  Hypertension associated with diabetes ([place]) - Plan: Basic metabolic panel, CBC  -discussed lifestyle changes at length and advised a healthy, whole foods based, low sugar diet and regular exercise -Seems that she  thinks changing her diet would be very tough, however she prefers this rather than adding more medications -He opted to check labs today and then make a decision based on the findings -Diabetic foot exam done today -Offered flu shot -She might consider a longer visit for follow-up and bring a diet journal so that we can help her with tweaking her diet - she has eaten poorly her whole life and is not sure that she knows how to make healthy choices -Patient advised to return or notify a doctor immediately if symptoms worsen or persist or new concerns arise.  Patient Instructions  BEFORE YOU LEAVE: -?flu shot -labs -follow up: 3 months (or sooner if you want to do lifestyle/diet counseling for 30 minute appt)  We have ordered labs or studies at this visit. It can take up to 1-2 weeks for results and processing. IF results require follow up or explanation, we will call you with instructions. Clinically stable results will be released to your [facility]. If you have not heard from [place] or cannot find your results in [facility] in 2 weeks please contact our office at [phone].  If you are not yet signed up for [facility], please consider signing up.    We recommend the following healthy lifestyle for LIFE: 1) Small portions.   Tip: eat off of a salad plate instead of a dinner plate.  Tip: It is ok to feel hungry after a meal - that likely means you ate an appropriate portion.  Tip: if you need more or a snack choose fruits, veggies and/or a handful of nuts or seeds.  2) Eat a healthy clean diet.   TRY TO EAT: -at least 5-7 servings of low sugar vegetables per day (not corn, potatoes or bananas.) -berries are the best choice if you wish to eat fruit.   -lean meets (fish, chicken or [place] breasts) -vegan proteins for some meals - beans or tofu, whole grains, nuts and seeds -Replace bad fats with good fats - good fats include: fish, nuts and seeds, canola oil, olive oil -small amounts of low fat or  non fat dairy -small amounts of100 % whole grains - check the lables  AVOID: -SUGAR, sweets, anything with added sugar, corn syrup or sweeteners -if you must have a sweetener, small amounts of stevia may be best -sweetened beverages -simple starches (rice, bread, potatoes, pasta, chips, etc - small amounts of 100% whole grains are ok) -red meat, pork, butter -fried foods, fast food, processed food, excessive dairy, eggs and coconut.  3)Get at least 150 minutes of sweaty aerobic exercise per week.  4)Reduce stress - consider counseling, meditation and relaxation to balance other aspects of your life.         Colin Benton R., DO

## 2016-12-03 NOTE — Patient Instructions (Signed)
BEFORE YOU LEAVE: -?flu shot -labs -follow up: 3 months (or sooner if you want to do lifestyle/diet counseling for 30 minute appt)  We have ordered labs or studies at this visit. It can take up to 1-2 weeks for results and processing. IF results require follow up or explanation, we will call you with instructions. Clinically stable results will be released to your [facility]. If you have not heard from [place] or cannot find your results in [facility] in 2 weeks please contact our office at [phone].  If you are not yet signed up for [facility], please consider signing up.    We recommend the following healthy lifestyle for LIFE: 1) Small portions.   Tip: eat off of a salad plate instead of a dinner plate.  Tip: It is ok to feel hungry after a meal - that likely means you ate an appropriate portion.  Tip: if you need more or a snack choose fruits, veggies and/or a handful of nuts or seeds.  2) Eat a healthy clean diet.   TRY TO EAT: -at least 5-7 servings of low sugar vegetables per day (not corn, potatoes or bananas.) -berries are the best choice if you wish to eat fruit.   -lean meets (fish, chicken or [place] breasts) -vegan proteins for some meals - beans or tofu, whole grains, nuts and seeds -Replace bad fats with good fats - good fats include: fish, nuts and seeds, canola oil, olive oil -small amounts of low fat or non fat dairy -small amounts of100 % whole grains - check the lables  AVOID: -SUGAR, sweets, anything with added sugar, corn syrup or sweeteners -if you must have a sweetener, small amounts of stevia may be best -sweetened beverages -simple starches (rice, bread, potatoes, pasta, chips, etc - small amounts of 100% whole grains are ok) -red meat, pork, butter -fried foods, fast food, processed food, excessive dairy, eggs and coconut.  3)Get at least 150 minutes of sweaty aerobic exercise per week.  4)Reduce stress - consider counseling, meditation and relaxation to balance  other aspects of your life.

## 2016-12-03 NOTE — Addendum Note (Signed)
Addended by: Agnes Lawrence on: 12/03/2016 04:18 PM   Modules accepted: Orders

## 2016-12-03 NOTE — Telephone Encounter (Signed)
Rx done. 

## 2016-12-14 ENCOUNTER — Other Ambulatory Visit: Payer: Self-pay | Admitting: Family Medicine

## 2016-12-14 MED FILL — PRAVASTATIN NA 40 MG TAB: 40 | 90 days supply | Qty: 90 | Fill #0

## 2016-12-21 ENCOUNTER — Other Ambulatory Visit: Payer: Self-pay

## 2016-12-21 DIAGNOSIS — E785 Hyperlipidemia, unspecified: Secondary | ICD-10-CM

## 2016-12-21 DIAGNOSIS — E119 Type 2 diabetes mellitus without complications: Secondary | ICD-10-CM

## 2016-12-21 DIAGNOSIS — E1159 Type 2 diabetes mellitus with other circulatory complications: Secondary | ICD-10-CM

## 2016-12-21 DIAGNOSIS — I152 Hypertension secondary to endocrine disorders: Secondary | ICD-10-CM

## 2016-12-21 DIAGNOSIS — I10 Essential (primary) hypertension: Secondary | ICD-10-CM

## 2016-12-21 DIAGNOSIS — E1169 Type 2 diabetes mellitus with other specified complication: Secondary | ICD-10-CM

## 2016-12-23 ENCOUNTER — Encounter: Payer: Self-pay | Admitting: Family Medicine

## 2016-12-31 ENCOUNTER — Other Ambulatory Visit: Payer: Self-pay | Admitting: Family Medicine

## 2016-12-31 MED FILL — LISINOPRIL 20 MG TAB: 20 | 90 days supply | Qty: 90 | Fill #0

## 2017-01-05 ENCOUNTER — Other Ambulatory Visit: Payer: Self-pay | Admitting: Family Medicine

## 2017-01-06 MED FILL — JANUVIA 100 MG TABLET: 100 | 90 days supply | Qty: 90 | Fill #0

## 2017-01-06 MED FILL — metFORMIN HCL 1000 MG TABS: 1000 | 60 days supply | Qty: 60 | Fill #0

## 2017-01-11 ENCOUNTER — Other Ambulatory Visit: Payer: Self-pay | Admitting: Family Medicine

## 2017-01-11 MED FILL — TRIAMTERENE-HCTZ 37.5-25 MG: 37.5-25 | 90 days supply | Qty: 90 | Fill #0

## 2017-01-13 ENCOUNTER — Other Ambulatory Visit: Payer: 59

## 2017-01-14 ENCOUNTER — Encounter: Payer: Self-pay | Admitting: Family Medicine

## 2017-01-14 ENCOUNTER — Ambulatory Visit (INDEPENDENT_AMBULATORY_CARE_PROVIDER_SITE_OTHER): Payer: 59 | Admitting: Family Medicine

## 2017-01-14 VITALS — BP 128/82 | HR 104 | Temp 98.4°F | Ht 63.0 in | Wt 190.3 lb

## 2017-01-14 DIAGNOSIS — S01501A Unspecified open wound of lip, initial encounter: Secondary | ICD-10-CM

## 2017-01-14 MED ORDER — AMOXICILLIN-POT CLAVULANATE 875-125 MG PO TABS: 1.0000 | ORAL_TABLET | Freq: Two times a day (BID) | ORAL | 0 refills | Status: DC

## 2017-01-14 MED FILL — AMOX TR-K CLV 875-125 MG TA: 875-125 | 5 days supply | Qty: 10 | Fill #0

## 2017-01-14 NOTE — Progress Notes (Signed)
HPI:  Acute visit for busted lip: -Tripped over her granddaughter yesterday and busted her lip -It is swollen today and she feels like it may be infected -Reported some and she did clean it well -She has a small abrasion on the left forehead and left cheek, otherwise did not suffer any other injuries -No fever, malaise, significant headache, neck pain, dizziness, loss of consciousness, purulent discharge  ROS: See pertinent positives and negatives per HPI.  Past Medical History:  Diagnosis Date  . Diabetes mellitus   . Elevated cholesterol   . Hypertension     Past Surgical History:  Procedure Laterality Date  . ABDOMINAL HYSTERECTOMY      Family History  Problem Relation Age of Onset  . Diabetes Mother   . Congestive Heart Failure Mother     Social History   Social History  . Marital status: Divorced    Spouse name: N/A  . Number of children: N/A  . Years of education: N/A   Social History Main Topics  . Smoking status: Never Smoker  . Smokeless tobacco: Never Used  . Alcohol use No  . Drug use: No  . Sexual activity: Not Asked   Other Topics Concern  . None   Social History Narrative  . None     Current Outpatient Prescriptions:  .  Alcohol Swabs PADS, Use as directed., Disp: 100 each, Rfl: 12 .  Continuous Blood Gluc Receiver (FREESTYLE LIBRE READER) DEVI, 1 Device by Other route as directed., Disp: 1 Device, Rfl: 0 .  Continuous Blood Gluc Sensor ([place]) MISC, Use as directed to check blood sugar once a day, Disp: 1 each, Rfl: 0 .  Continuous Blood Gluc Sensor ([place]) MISC, Use as directed to check blood sugar once a day, Disp: 3 each, Rfl: 3 .  fluticasone (FLONASE) 50 MCG/ACT nasal spray, Place 2 sprays into both nostrils daily., Disp: 16 g, Rfl: 6 .  glucose blood (TRUETEST TEST) test strip, TEST TWICE WEEKLY., Disp: 100 each, Rfl: 3 .  JANUVIA 100 MG tablet, TAKE 1 TABLET BY MOUTH ONCE DAILY, Disp: 90  tablet, Rfl: 1 .  lisinopril (PRINIVIL,ZESTRIL) 20 MG tablet, TAKE 1 TABLET BY MOUTH ONCE DAILY. PATIENT NEEDS AN APPOINTMENT, Disp: 30 tablet, Rfl: 5 .  loratadine (CLARITIN) 10 MG tablet, Take 10 mg by mouth daily., Disp: , Rfl:  .  metFORMIN (GLUCOPHAGE) 1000 MG tablet, TAKE 1 TABLET BY MOUTH TWICE DAILY WITH MEALS. **NEEDS APPOINTMENT FOR MORE REFILLS**, Disp: 60 tablet, Rfl: 5 .  pravastatin (PRAVACHOL) 40 MG tablet, TAKE 1 TABLET BY MOUTH ONCE DAILY (NEED APPT), Disp: 90 tablet, Rfl: 1 .  triamterene-hydrochlorothiazide (MAXZIDE-25) 37.5-25 MG tablet, Take 1 tablet by mouth daily., Disp: 90 tablet, Rfl: 1 .  TRUEPLUS LANCETS 30G MISC, USE AS DIRECTED 2 TIMES A WEEK, Disp: 100 each, Rfl: 3 .  amoxicillin-clavulanate (AUGMENTIN) 875-125 MG tablet, Take 1 tablet by mouth 2 (two) times daily., Disp: 10 tablet, Rfl: 0  EXAM:  Vitals:   01/14/17 1342  BP: 128/82  Pulse: (!) 104    Body mass index is 33.71 kg/m.  GENERAL: vitals reviewed and listed above, alert, oriented, appears well hydrated and in no acute distress  HEENT: PERRLA, conjunttiva clear, no obvious abnormalities on inspection of external ears, she has a small abrasion on the nose, the left forehead in the left cheek without any significant bony tenderness to palpation in these areas, she has a small laceration on the upper  lip with some swelling in this area, she has a very minor chip in one of her front teeth  NECK: no obvious masses on inspection, normal range of motion, no bony tenderness to palpation  LUNGS: clear to auscultation bilaterally, no wheezes, rales or rhonchi, good air movement  CV: HRRR, no peripheral edema  MS: moves all extremities without noticeable abnormality  PSYCH: pleasant and cooperative, no obvious depression or anxiety  ASSESSMENT AND PLAN:  Discussed the following assessment and plan:  Open wound of lip, unspecified open wound type, initial encounter  -abx in case of infection -ice  and Aquaphor -advised to see dentist about her tooth -Does not seem to have suffered any other significant injuries, but did discuss that if she should have a severe headache or other symptoms she should be evaluated immediately -Patient advised to return or notify a doctor immediately if symptoms worsen or persist or new concerns arise.  Patient Instructions  Follow up as scheduled next month  Ice a few times daily (ensure ice wrapped in towel)  Take the medication as instructed  Aquaphor on cut  I hope you are feeling better soon! Follow up or seek care if worsening, new concerns or you are not improving with treatment.     Colin Benton R., DO

## 2017-01-14 NOTE — Patient Instructions (Addendum)
Follow up as scheduled next month  Ice a few times daily (ensure ice wrapped in towel)  Take the medication as instructed  Aquaphor on cut  I hope you are feeling better soon! Follow up or seek care if worsening, new concerns or you are not improving with treatment.

## 2017-02-18 DIAGNOSIS — H04123 Dry eye syndrome of bilateral lacrimal glands: Secondary | ICD-10-CM | POA: Diagnosis not present

## 2017-02-18 DIAGNOSIS — H43812 Vitreous degeneration, left eye: Secondary | ICD-10-CM | POA: Diagnosis not present

## 2017-02-18 DIAGNOSIS — D3132 Benign neoplasm of left choroid: Secondary | ICD-10-CM | POA: Diagnosis not present

## 2017-02-18 DIAGNOSIS — E119 Type 2 diabetes mellitus without complications: Secondary | ICD-10-CM | POA: Diagnosis not present

## 2017-02-18 DIAGNOSIS — H2513 Age-related nuclear cataract, bilateral: Secondary | ICD-10-CM | POA: Diagnosis not present

## 2017-02-22 ENCOUNTER — Other Ambulatory Visit: Payer: 59

## 2017-03-04 ENCOUNTER — Ambulatory Visit: Payer: 59 | Admitting: Family Medicine

## 2017-03-08 MED FILL — metFORMIN HCL 1000 MG TABS: 1000 | 30 days supply | Qty: 60 | Fill #1

## 2017-03-17 NOTE — Progress Notes (Deleted)
HPI:  Laura Marsh is a pleasant 61 y.o. here for follow up.  History of poor compliance.  Chronic medical problems summarized below were reviewed for changes.  She had uncontrolled diabetes and elevated calcium on her last set of labs, but did not follow our recommendations were follow-up to recheck as advised.***. Denies CP, SOB, DOE, treatment intolerance or new symptoms. Due for flu shot, labs Refused vaccines in the past, due for pneumonia vaccine.  Diabetes mellitus: -Medications include: Metformin 1000 mg daily  ( reports does not tolerate higher dose); Januvia 100 mg daily -Poor compliance in the past -Reports she did diabetes education which was mildly helpful -She prefers to avoid more medications -Poor diet, lots of sweets in diet products along with lots of simple starches - she feels like it would be very hard to change, but she prefers to try to change diet rather than add medications  Hypertension/hyperlipidemia/obesity: -Poor diet -No regular exercise -Is trying to get about 4000 steps per day at work -She prefers to avoid more medications -current meds: lisinopril, pravastatin, triamterene-hydrochlorothiazide   ROS: See pertinent positives and negatives per HPI.  Past Medical History:  Diagnosis Date  . Diabetes mellitus   . Elevated cholesterol   . Hypertension     Past Surgical History:  Procedure Laterality Date  . ABDOMINAL HYSTERECTOMY      Family History  Problem Relation Age of Onset  . Diabetes Mother   . Congestive Heart Failure Mother     Social History   Socioeconomic History  . Marital status: Divorced    Spouse name: Not on file  . Number of children: Not on file  . Years of education: Not on file  . Highest education level: Not on file  Social Needs  . Financial resource strain: Not on file  . Food insecurity - worry: Not on file  . Food insecurity - inability: Not on file  . Transportation needs - medical: Not on file  .  Transportation needs - non-medical: Not on file  Occupational History  . Not on file  Tobacco Use  . Smoking status: Never Smoker  . Smokeless tobacco: Never Used  Substance and Sexual Activity  . Alcohol use: No  . Drug use: No  . Sexual activity: Not on file  Other Topics Concern  . Not on file  Social History Narrative  . Not on file     Current Outpatient Medications:  .  Alcohol Swabs PADS, Use as directed., Disp: 100 each, Rfl: 12 .  amoxicillin-clavulanate (AUGMENTIN) 875-125 MG tablet, Take 1 tablet by mouth 2 (two) times daily., Disp: 10 tablet, Rfl: 0 .  Continuous Blood Gluc Receiver (FREESTYLE LIBRE READER) DEVI, 1 Device by Other route as directed., Disp: 1 Device, Rfl: 0 .  Continuous Blood Gluc Sensor ([place]) MISC, Use as directed to check blood sugar once a day, Disp: 1 each, Rfl: 0 .  Continuous Blood Gluc Sensor ([place]) MISC, Use as directed to check blood sugar once a day, Disp: 3 each, Rfl: 3 .  fluticasone (FLONASE) 50 MCG/ACT nasal spray, Place 2 sprays into both nostrils daily., Disp: 16 g, Rfl: 6 .  glucose blood (TRUETEST TEST) test strip, TEST TWICE WEEKLY., Disp: 100 each, Rfl: 3 .  JANUVIA 100 MG tablet, TAKE 1 TABLET BY MOUTH ONCE DAILY, Disp: 90 tablet, Rfl: 1 .  lisinopril (PRINIVIL,ZESTRIL) 20 MG tablet, TAKE 1 TABLET BY MOUTH ONCE DAILY. PATIENT NEEDS AN APPOINTMENT,  Disp: 30 tablet, Rfl: 5 .  loratadine (CLARITIN) 10 MG tablet, Take 10 mg by mouth daily., Disp: , Rfl:  .  metFORMIN (GLUCOPHAGE) 1000 MG tablet, TAKE 1 TABLET BY MOUTH TWICE DAILY WITH MEALS. **NEEDS APPOINTMENT FOR MORE REFILLS**, Disp: 60 tablet, Rfl: 5 .  pravastatin (PRAVACHOL) 40 MG tablet, TAKE 1 TABLET BY MOUTH ONCE DAILY (NEED APPT), Disp: 90 tablet, Rfl: 1 .  triamterene-hydrochlorothiazide (MAXZIDE-25) 37.5-25 MG tablet, Take 1 tablet by mouth daily., Disp: 90 tablet, Rfl: 1 .  TRUEPLUS LANCETS 30G MISC, USE AS DIRECTED 2 TIMES A  WEEK, Disp: 100 each, Rfl: 3  EXAM:  There were no vitals filed for this visit.  There is no height or weight on file to calculate BMI.  GENERAL: vitals reviewed and listed above, alert, oriented, appears well hydrated and in no acute distress  HEENT: atraumatic, conjunttiva clear, no obvious abnormalities on inspection of external nose and ears  NECK: no obvious masses on inspection  LUNGS: clear to auscultation bilaterally, no wheezes, rales or rhonchi, good air movement  CV: HRRR, no peripheral edema  MS: moves all extremities without noticeable abnormality *** PSYCH: pleasant and cooperative, no obvious depression or anxiety  ASSESSMENT AND PLAN:  Discussed the following assessment and plan:  No diagnosis found.  *** -Patient advised to return or notify a doctor immediately if symptoms worsen or persist or new concerns arise.  There are no Patient Instructions on file for this visit.  Colin Benton R., DO

## 2017-03-18 ENCOUNTER — Ambulatory Visit: Payer: 59 | Admitting: Family Medicine

## 2017-03-23 MED FILL — LISINOPRIL 20 MG TABLET: 20 | 90 days supply | Qty: 90 | Fill #1

## 2017-04-04 MED FILL — JANUVIA 100 MG TABLET: 100 | 90 days supply | Qty: 90 | Fill #1

## 2017-04-07 NOTE — Progress Notes (Deleted)
HPI:  Laura Marsh is a pleasant 62 y.o. here for follow up. Chronic medical problems summarized below were reviewed for changes and stability and were updated as needed below. These issues and their treatment remain stable for the most part. ***. Denies CP, SOB, DOE, treatment intolerance or new symptoms.  Diabetes mellitus: -Medications include: Metformin 1000 mg daily  ( reports does not tolerate higher dose); Januvia 100 mg daily -Poor compliance in the past -Reports she did diabetes education which was mildly helpful -She prefers to avoid more medications -Poor diet, lots of sweets in diet products along with lots of simple starches - she feels like it would be very hard to change, but she prefers to try to change diet rather than add medications  Hypertension/hyperlipidemia/obesity: -Poor diet -No regular exercise -Is trying to get about 4000 steps per day at work -She prefers to avoid more medications -current meds: lisinopril, pravastatin, triamterene-hydrochlorothiazide   ROS: See pertinent positives and negatives per HPI.  Past Medical History:  Diagnosis Date  . Diabetes mellitus   . Elevated cholesterol   . Hypertension     Past Surgical History:  Procedure Laterality Date  . ABDOMINAL HYSTERECTOMY      Family History  Problem Relation Age of Onset  . Diabetes Mother   . Congestive Heart Failure Mother     Social History   Socioeconomic History  . Marital status: Divorced    Spouse name: Not on file  . Number of children: Not on file  . Years of education: Not on file  . Highest education level: Not on file  Social Needs  . Financial resource strain: Not on file  . Food insecurity - worry: Not on file  . Food insecurity - inability: Not on file  . Transportation needs - medical: Not on file  . Transportation needs - non-medical: Not on file  Occupational History  . Not on file  Tobacco Use  . Smoking status: Never Smoker  . Smokeless  tobacco: Never Used  Substance and Sexual Activity  . Alcohol use: No  . Drug use: No  . Sexual activity: Not on file  Other Topics Concern  . Not on file  Social History Narrative  . Not on file     Current Outpatient Medications:  .  Alcohol Swabs PADS, Use as directed., Disp: 100 each, Rfl: 12 .  amoxicillin-clavulanate (AUGMENTIN) 875-125 MG tablet, Take 1 tablet by mouth 2 (two) times daily., Disp: 10 tablet, Rfl: 0 .  Continuous Blood Gluc Receiver (FREESTYLE LIBRE READER) DEVI, 1 Device by Other route as directed., Disp: 1 Device, Rfl: 0 .  Continuous Blood Gluc Sensor ([place]) MISC, Use as directed to check blood sugar once a day, Disp: 1 each, Rfl: 0 .  Continuous Blood Gluc Sensor ([place]) MISC, Use as directed to check blood sugar once a day, Disp: 3 each, Rfl: 3 .  fluticasone (FLONASE) 50 MCG/ACT nasal spray, Place 2 sprays into both nostrils daily., Disp: 16 g, Rfl: 6 .  glucose blood (TRUETEST TEST) test strip, TEST TWICE WEEKLY., Disp: 100 each, Rfl: 3 .  JANUVIA 100 MG tablet, TAKE 1 TABLET BY MOUTH ONCE DAILY, Disp: 90 tablet, Rfl: 1 .  lisinopril (PRINIVIL,ZESTRIL) 20 MG tablet, TAKE 1 TABLET BY MOUTH ONCE DAILY. PATIENT NEEDS AN APPOINTMENT, Disp: 30 tablet, Rfl: 5 .  loratadine (CLARITIN) 10 MG tablet, Take 10 mg by mouth daily., Disp: , Rfl:  .  metFORMIN (GLUCOPHAGE)  1000 MG tablet, TAKE 1 TABLET BY MOUTH TWICE DAILY WITH MEALS. **NEEDS APPOINTMENT FOR MORE REFILLS**, Disp: 60 tablet, Rfl: 5 .  pravastatin (PRAVACHOL) 40 MG tablet, TAKE 1 TABLET BY MOUTH ONCE DAILY (NEED APPT), Disp: 90 tablet, Rfl: 1 .  triamterene-hydrochlorothiazide (MAXZIDE-25) 37.5-25 MG tablet, Take 1 tablet by mouth daily., Disp: 90 tablet, Rfl: 1 .  TRUEPLUS LANCETS 30G MISC, USE AS DIRECTED 2 TIMES A WEEK, Disp: 100 each, Rfl: 3  EXAM:  There were no vitals filed for this visit.  There is no height or weight on file to calculate  BMI.  GENERAL: vitals reviewed and listed above, alert, oriented, appears well hydrated and in no acute distress  HEENT: atraumatic, conjunttiva clear, no obvious abnormalities on inspection of external nose and ears  NECK: no obvious masses on inspection  LUNGS: clear to auscultation bilaterally, no wheezes, rales or rhonchi, good air movement  CV: HRRR, no peripheral edema  MS: moves all extremities without noticeable abnormality *** PSYCH: pleasant and cooperative, no obvious depression or anxiety  ASSESSMENT AND PLAN:  Discussed the following assessment and plan:  No diagnosis found.  *** -Patient advised to return or notify a doctor immediately if symptoms worsen or persist or new concerns arise.  There are no Patient Instructions on file for this visit.  Colin Benton R., DO

## 2017-04-08 ENCOUNTER — Ambulatory Visit: Payer: 59 | Admitting: Family Medicine

## 2017-04-14 ENCOUNTER — Encounter: Payer: Self-pay | Admitting: Family Medicine

## 2017-04-27 MED FILL — TRIAMTERENE-HCTZ 37.5-25 MG: 37.5-25 | 90 days supply | Qty: 90 | Fill #1

## 2017-04-27 MED FILL — metFORMIN HCL 1000 MG TABS: 1000 | 30 days supply | Qty: 60 | Fill #2

## 2017-05-06 DIAGNOSIS — H52221 Regular astigmatism, right eye: Secondary | ICD-10-CM | POA: Diagnosis not present

## 2017-05-06 DIAGNOSIS — H524 Presbyopia: Secondary | ICD-10-CM | POA: Diagnosis not present

## 2017-05-06 DIAGNOSIS — Z01419 Encounter for gynecological examination (general) (routine) without abnormal findings: Secondary | ICD-10-CM | POA: Diagnosis not present

## 2017-05-06 DIAGNOSIS — Z6834 Body mass index (BMI) 34.0-34.9, adult: Secondary | ICD-10-CM | POA: Diagnosis not present

## 2017-06-14 ENCOUNTER — Other Ambulatory Visit: Payer: Self-pay | Admitting: Family Medicine

## 2017-06-14 MED FILL — LISINOPRIL 20 MG TABLET: 20 | 30 days supply | Qty: 30 | Fill #0

## 2017-06-14 MED FILL — metFORMIN HCL 1000 MG TABS: 1000 | 30 days supply | Qty: 60 | Fill #3

## 2017-06-14 MED FILL — PRAVASTATIN NA 40 MG TAB: 40 | 90 days supply | Qty: 90 | Fill #1

## 2017-06-25 ENCOUNTER — Encounter: Payer: Self-pay | Admitting: Physician Assistant

## 2017-06-25 ENCOUNTER — Ambulatory Visit: Payer: 59 | Admitting: Physician Assistant

## 2017-06-25 VITALS — BP 150/94 | HR 95 | Temp 98.8°F | Resp 16 | Ht 63.0 in | Wt 193.1 lb

## 2017-06-25 DIAGNOSIS — J0110 Acute frontal sinusitis, unspecified: Secondary | ICD-10-CM | POA: Diagnosis not present

## 2017-06-25 MED ORDER — DOXYCYCLINE HYCLATE 100 MG PO TABS: 100.0000 mg | ORAL_TABLET | Freq: Two times a day (BID) | ORAL | 0 refills | Status: DC

## 2017-06-25 MED ORDER — BENZONATATE 200 MG PO CAPS: 200.0000 mg | ORAL_CAPSULE | Freq: Two times a day (BID) | ORAL | 0 refills | Status: DC | PRN

## 2017-06-25 NOTE — Progress Notes (Signed)
Laura Marsh is a 62 y.o. female here for a new problem.   History of Present Illness:   Chief Complaint  Patient presents with  . Sinusitis    HPI   URI symptoms started at least 2 weeks ago. Thought it was allergies at first, but then developed significant congestion and drainage. Has tried Sudafed and Claritin. Congestion is thick and green. Is starting to bring up mucus when she coughs. Husband has a cough as well. Appetite is fine. Feels well hydrated. Denies diarrhea. No SOB or CP. Recent travel to [place]. Denies lower leg swelling. Did take alka seltzer recently and suspects that caused a small spike in her BP.  Lab Results  Component Value Date   HGBA1C 8.1 (H) 12/03/2016       Past Medical History:  Diagnosis Date  . Diabetes mellitus   . Elevated cholesterol   . Hypertension      Social History   Socioeconomic History  . Marital status: Divorced    Spouse name: Not on file  . Number of children: Not on file  . Years of education: Not on file  . Highest education level: Not on file  Occupational History  . Not on file  Social Needs  . Financial resource strain: Not on file  . Food insecurity:    Worry: Not on file    Inability: Not on file  . Transportation needs:    Medical: Not on file    Non-medical: Not on file  Tobacco Use  . Smoking status: Never Smoker  . Smokeless tobacco: Never Used  Substance and Sexual Activity  . Alcohol use: No  . Drug use: No  . Sexual activity: Not on file  Lifestyle  . Physical activity:    Days per week: Not on file    Minutes per session: Not on file  . Stress: Not on file  Relationships  . Social connections:    Talks on phone: Not on file    Gets together: Not on file    Attends religious service: Not on file    Active member of club or organization: Not on file    Attends meetings of clubs or organizations: Not on file    Relationship status: Not on file  . Intimate partner violence:    Fear of  current or ex partner: Not on file    Emotionally abused: Not on file    Physically abused: Not on file    Forced sexual activity: Not on file  Other Topics Concern  . Not on file  Social History Narrative  . Not on file    Past Surgical History:  Procedure Laterality Date  . ABDOMINAL HYSTERECTOMY      Family History  Problem Relation Age of Onset  . Diabetes Mother   . Congestive Heart Failure Mother     No Known Allergies  Current Medications:   Current Outpatient Medications:  .  Alcohol Swabs PADS, Use as directed., Disp: 100 each, Rfl: 12 .  benzonatate (TESSALON) 200 MG capsule, Take 1 capsule (200 mg total) by mouth 2 (two) times daily as needed for cough., Disp: 20 capsule, Rfl: 0 .  Continuous Blood Gluc Receiver (FREESTYLE LIBRE READER) DEVI, 1 Device by Other route as directed., Disp: 1 Device, Rfl: 0 .  Continuous Blood Gluc Sensor ([place]) MISC, Use as directed to check blood sugar once a day, Disp: 1 each, Rfl: 0 .  Continuous Blood Gluc Sensor (FREESTYLE  [place]) MISC, Use as directed to check blood sugar once a day, Disp: 3 each, Rfl: 3 .  doxycycline (VIBRA-TABS) 100 MG tablet, Take 1 tablet (100 mg total) by mouth 2 (two) times daily., Disp: 20 tablet, Rfl: 0 .  fluticasone (FLONASE) 50 MCG/ACT nasal spray, Place 2 sprays into both nostrils daily., Disp: 16 g, Rfl: 6 .  glucose blood (TRUETEST TEST) test strip, TEST TWICE WEEKLY., Disp: 100 each, Rfl: 3 .  JANUVIA 100 MG tablet, TAKE 1 TABLET BY MOUTH ONCE DAILY, Disp: 30 tablet, Rfl: 0 .  lisinopril (PRINIVIL,ZESTRIL) 20 MG tablet, TAKE 1 TABLET BY MOUTH ONCE DAILY (NEED APPT), Disp: 30 tablet, Rfl: 0 .  loratadine (CLARITIN) 10 MG tablet, Take 10 mg by mouth daily., Disp: , Rfl:  .  metFORMIN (GLUCOPHAGE) 1000 MG tablet, TAKE 1 TABLET BY MOUTH TWICE DAILY WITH MEALS. **NEEDS APPOINTMENT FOR MORE REFILLS**, Disp: 60 tablet, Rfl: 5 .  pravastatin (PRAVACHOL) 40 MG tablet,  TAKE 1 TABLET BY MOUTH ONCE DAILY (NEED APPT), Disp: 90 tablet, Rfl: 1 .  triamterene-hydrochlorothiazide (MAXZIDE-25) 37.5-25 MG tablet, TAKE 1 TABLET BY MOUTH ONCE DAILY, Disp: 30 tablet, Rfl: 0 .  TRUEPLUS LANCETS 30G MISC, USE AS DIRECTED 2 TIMES A WEEK, Disp: 100 each, Rfl: 3   Review of Systems:   ROS  Negative unless otherwise specified per HPI.   Vitals:   Vitals:   06/25/17 1132  BP: (!) 150/94  Pulse: 95  Resp: 16  Temp: 98.8 F (37.1 C)  TempSrc: Oral  SpO2: 99%  Weight: 193 lb 1.3 oz (87.6 kg)  Height: 5\' 3"  (1.6 m)     Body mass index is 34.2 kg/m.  Physical Exam:   Physical Exam  Constitutional: She appears well-developed. She is cooperative.  Non-toxic appearance. She does not have a sickly appearance. She does not appear ill. No distress.  HENT:  Head: Normocephalic and atraumatic.  Right Ear: Tympanic membrane, external ear and ear canal normal. Tympanic membrane is not erythematous, not retracted and not bulging.  Left Ear: Tympanic membrane, external ear and ear canal normal. Tympanic membrane is not erythematous, not retracted and not bulging.  Nose: Mucosal edema and rhinorrhea present. Right sinus exhibits frontal sinus tenderness. Right sinus exhibits no maxillary sinus tenderness. Left sinus exhibits frontal sinus tenderness. Left sinus exhibits no maxillary sinus tenderness.  Mouth/Throat: Uvula is midline and mucous membranes are normal. Posterior oropharyngeal erythema present. No posterior oropharyngeal edema. Tonsils are 1+ on the right. Tonsils are 1+ on the left. No tonsillar exudate.  Eyes: Conjunctivae and lids are normal.  Neck: Trachea normal.  Cardiovascular: Normal rate, regular rhythm, S1 normal, S2 normal and normal heart sounds.  Pulmonary/Chest: Effort normal and breath sounds normal. She has no decreased breath sounds. She has no wheezes. She has no rhonchi. She has no rales.  Lymphadenopathy:    She has no cervical adenopathy.   Neurological: She is alert.  Skin: Skin is warm, dry and intact.  Psychiatric: She has a normal mood and affect. Her speech is normal and behavior is normal.  Nursing note and vitals reviewed.   Assessment and Plan:    Laura Marsh was seen today for sinusitis.  Diagnoses and all orders for this visit:  Acute non-recurrent frontal sinusitis  Other orders -     doxycycline (VIBRA-TABS) 100 MG tablet; Take 1 tablet (100 mg total) by mouth 2 (two) times daily. -     benzonatate (TESSALON) 200 MG capsule; Take 1  capsule (200 mg total) by mouth 2 (two) times daily as needed for cough.   No red flags on exam.  Will initiate Doxycycline per orders given duration of symptoms >2 weeks. Tessalon as needed for cough. Discussed taking medications as prescribed. Avoid excessive NSAIDs or sudafed products to prevent BP elevation. Reviewed return precautions including worsening fever, SOB, worsening cough or other concerns. Push fluids and rest. I recommend that patient follow-up if symptoms worsen or persist despite treatment x 7-10 days, sooner if needed.  . Reviewed expectations re: course of current medical issues. . Discussed self-management of symptoms. . Outlined signs and symptoms indicating need for more acute intervention. . Patient verbalized understanding and all questions were answered. . See orders for this visit as documented in the electronic medical record. . Patient received an After-Visit Summary.   Inda Coke, PA-C

## 2017-06-25 NOTE — Patient Instructions (Signed)
It was great to see you! Use medication as prescribed: Doxycycline and Tessalon [place] Push fluids and get plenty of rest. Please return if you are not improving as expected, or if you have high fevers (>101.5) or difficulty swallowing or worsening productive cough.  Call clinic with questions.  I hope you start feeling better soon!

## 2017-07-14 ENCOUNTER — Other Ambulatory Visit: Payer: Self-pay | Admitting: Family Medicine

## 2017-07-14 MED FILL — JANUVIA 100 MG TABLET: 100 | 30 days supply | Qty: 30 | Fill #0

## 2017-07-14 MED FILL — LISINOPRIL 20 MG TABLET: 20 | 30 days supply | Qty: 30 | Fill #0

## 2017-07-14 NOTE — Telephone Encounter (Signed)
Pt scheduled appt for 07/26/17 with Dr. Maudie Mercury. Pt requesting lisinopril and januvia be sent in until she gets in for her appt.  [place], [place] - [place] [phone] (Phone) [phone] (Fax)

## 2017-07-25 NOTE — Progress Notes (Deleted)
HPI:  Using dictation device. Unfortunately this device frequently misinterprets words/phrases.  Laura Marsh is a pleasant 62 y.o. here for follow up. Chronic medical problems summarized below were reviewed for changes. History of poor compliance. ***. Denies CP, SOB, DOE, treatment intolerance or new symptoms. Due for labs, CPE, pap? (s/p hysterectomy), eye exam  Diabetes mellitus: -Medications include: Metformin 1000 mg daily  ( reports does not tolerate higher dose); Januvia 100 mg daily -Poor compliance in the past -Reports she did diabetes education which was mildly helpful -She prefers to avoid more medications -Poor diet, lots of sweets in diet products along with lots of simple starches - she feels like it would be very hard to change, but she prefers to try to change diet rather than add medications  Hypertension/hyperlipidemia/obesity: -Poor diet -No regular exercise -Is trying to get about 4000 steps per day at work -She prefers to avoid more medications -current meds: lisinopril, pravastatin, triamterene-hydrochlorothiazide   ROS: See pertinent positives and negatives per HPI.  Past Medical History:  Diagnosis Date  . Diabetes mellitus   . Elevated cholesterol   . Hypertension     Past Surgical History:  Procedure Laterality Date  . ABDOMINAL HYSTERECTOMY      Family History  Problem Relation Age of Onset  . Diabetes Mother   . Congestive Heart Failure Mother     SOCIAL HX: ***   Current Outpatient Medications:  .  Alcohol Swabs PADS, Use as directed., Disp: 100 each, Rfl: 12 .  benzonatate (TESSALON) 200 MG capsule, Take 1 capsule (200 mg total) by mouth 2 (two) times daily as needed for cough., Disp: 20 capsule, Rfl: 0 .  Continuous Blood Gluc Receiver (FREESTYLE LIBRE READER) DEVI, 1 Device by Other route as directed., Disp: 1 Device, Rfl: 0 .  Continuous Blood Gluc Sensor ([place]) MISC, Use as directed to check blood  sugar once a day, Disp: 1 each, Rfl: 0 .  Continuous Blood Gluc Sensor ([place]) MISC, Use as directed to check blood sugar once a day, Disp: 3 each, Rfl: 3 .  doxycycline (VIBRA-TABS) 100 MG tablet, Take 1 tablet (100 mg total) by mouth 2 (two) times daily., Disp: 20 tablet, Rfl: 0 .  fluticasone (FLONASE) 50 MCG/ACT nasal spray, Place 2 sprays into both nostrils daily., Disp: 16 g, Rfl: 6 .  glucose blood (TRUETEST TEST) test strip, TEST TWICE WEEKLY., Disp: 100 each, Rfl: 3 .  JANUVIA 100 MG tablet, TAKE 1 TABLET BY MOUTH ONCE DAILY, Disp: 30 tablet, Rfl: 0 .  lisinopril (PRINIVIL,ZESTRIL) 20 MG tablet, TAKE 1 TABLET BY MOUTH ONCE DAILY (NEED APPT), Disp: 30 tablet, Rfl: 0 .  loratadine (CLARITIN) 10 MG tablet, Take 10 mg by mouth daily., Disp: , Rfl:  .  metFORMIN (GLUCOPHAGE) 1000 MG tablet, TAKE 1 TABLET BY MOUTH TWICE DAILY WITH MEALS. **NEEDS APPOINTMENT FOR MORE REFILLS**, Disp: 60 tablet, Rfl: 5 .  pravastatin (PRAVACHOL) 40 MG tablet, TAKE 1 TABLET BY MOUTH ONCE DAILY (NEED APPT), Disp: 90 tablet, Rfl: 1 .  triamterene-hydrochlorothiazide (MAXZIDE-25) 37.5-25 MG tablet, TAKE 1 TABLET BY MOUTH ONCE DAILY, Disp: 30 tablet, Rfl: 0 .  TRUEPLUS LANCETS 30G MISC, USE AS DIRECTED 2 TIMES A WEEK, Disp: 100 each, Rfl: 3  EXAM:  There were no vitals filed for this visit.  There is no height or weight on file to calculate BMI.  GENERAL: vitals reviewed and listed above, alert, oriented, appears well hydrated and in no acute  distress  HEENT: atraumatic, conjunttiva clear, no obvious abnormalities on inspection of external nose and ears  NECK: no obvious masses on inspection  LUNGS: clear to auscultation bilaterally, no wheezes, rales or rhonchi, good air movement  CV: HRRR, no peripheral edema  MS: moves all extremities without noticeable abnormality *** PSYCH: pleasant and cooperative, no obvious depression or anxiety  ASSESSMENT AND PLAN:  Discussed the  following assessment and plan:  No diagnosis found.  *** -Patient advised to return or notify a doctor immediately if symptoms worsen or persist or new concerns arise.  There are no Patient Instructions on file for this visit.  Lucretia Kern, DO

## 2017-07-26 ENCOUNTER — Ambulatory Visit: Payer: 59 | Admitting: Family Medicine

## 2017-08-01 NOTE — Progress Notes (Signed)
HPI:  Using dictation device. Unfortunately this device frequently misinterprets words/phrases.  Laura Marsh is a pleasant 62 y.o. here for follow up. Chronic medical problems summarized below were reviewed for changes. Hx of poor compliance and did not follow up as advised after last check up. Today report was treated for sinus infection a few weeks ago with doxycycline.  All symptoms resolved.  She went to the beach last week and she started to get some nasal congestion, postnasal drip and cough again.  She recently restarted her Flonase a few days ago.  Denies CP, SOB, DOE, treatment intolerance or new symptoms.   She reports she is due for colonoscopy.  Last done with [facility].  Reports they told her to follow-up in 3 years due to a polyp.  She wants a referral to the [facility]. Due for labs and CPE Reports she did her Pap smear and her eye exam recently.  Diabetes mellitus: -Medications include: Metformin 1000 mg daily  ( reports does not tolerate higher dose); Januvia 100 mg daily -Poor compliance in the past -Reported diabetes education which was mildly helpful -She prefers to avoid more medications -Poor diet, lots of sweets in diet, along with lots of simple starches - she feels like it would be very hard to change, but she prefers to try to change diet rather than add medications  Hypertension/hyperlipidemia/obesity: -Poor diet in the past -No regular exercise -She prefers to avoid more medications -current meds: lisinopril, pravastatin, triamterene-hydrochlorothiazide  Elevated cholesterol: -she did not return to recheck labs as instructed  ROS: See pertinent positives and negatives per HPI.  Past Medical History:  Diagnosis Date  . Diabetes mellitus   . Elevated cholesterol   . Hypertension     Past Surgical History:  Procedure Laterality Date  . ABDOMINAL HYSTERECTOMY      Family History  Problem Relation Age of Onset  . Diabetes Mother   .  Congestive Heart Failure Mother     SOCIAL HX: see hpi   Current Outpatient Medications:  .  Alcohol Swabs PADS, Use as directed., Disp: 100 each, Rfl: 12 .  benzonatate (TESSALON) 200 MG capsule, Take 1 capsule (200 mg total) by mouth 2 (two) times daily as needed for cough., Disp: 20 capsule, Rfl: 0 .  Continuous Blood Gluc Receiver (FREESTYLE LIBRE READER) DEVI, 1 Device by Other route as directed., Disp: 1 Device, Rfl: 0 .  Continuous Blood Gluc Sensor ([place]) MISC, Use as directed to check blood sugar once a day, Disp: 1 each, Rfl: 0 .  Continuous Blood Gluc Sensor ([place]) MISC, Use as directed to check blood sugar once a day, Disp: 3 each, Rfl: 3 .  fluticasone (FLONASE) 50 MCG/ACT nasal spray, Place 2 sprays into both nostrils daily., Disp: 16 g, Rfl: 6 .  glucose blood (TRUETEST TEST) test strip, TEST TWICE WEEKLY., Disp: 100 each, Rfl: 3 .  JANUVIA 100 MG tablet, TAKE 1 TABLET BY MOUTH ONCE DAILY, Disp: 30 tablet, Rfl: 0 .  lisinopril (PRINIVIL,ZESTRIL) 20 MG tablet, TAKE 1 TABLET BY MOUTH ONCE DAILY (NEED APPT), Disp: 30 tablet, Rfl: 0 .  loratadine (CLARITIN) 10 MG tablet, Take 10 mg by mouth daily., Disp: , Rfl:  .  metFORMIN (GLUCOPHAGE) 1000 MG tablet, TAKE 1 TABLET BY MOUTH TWICE DAILY WITH MEALS. **NEEDS APPOINTMENT FOR MORE REFILLS**, Disp: 60 tablet, Rfl: 5 .  pravastatin (PRAVACHOL) 40 MG tablet, TAKE 1 TABLET BY MOUTH ONCE DAILY (NEED APPT),  Disp: 90 tablet, Rfl: 1 .  triamterene-hydrochlorothiazide (MAXZIDE-25) 37.5-25 MG tablet, TAKE 1 TABLET BY MOUTH ONCE DAILY, Disp: 30 tablet, Rfl: 0 .  TRUEPLUS LANCETS 30G MISC, USE AS DIRECTED 2 TIMES A WEEK, Disp: 100 each, Rfl: 3 obesity  EXAM:  Vitals:   08/02/17 0914  BP: 118/90  Pulse: 92  Temp: 98.3 F (36.8 C)  SpO2: 96%    Body mass index is 34.72 kg/m.  GENERAL: vitals reviewed and listed above, alert, oriented, appears well hydrated and in no acute  distress  HEENT: atraumatic, conjunttiva clear, no obvious abnormalities on inspection of external nose and ears, normal appearance of ear canals and TMs, clear nasal congestion, mild post oropharyngeal erythema with PND, no tonsillar edema or exudate, no sinus TTP  NECK: no obvious masses on inspection  LUNGS: clear to auscultation bilaterally, no wheezes, rales or rhonchi, good air movement  CV: HRRR, no peripheral edema  MS: moves all extremities without noticeable abnormality  PSYCH: pleasant and cooperative, no obvious depression or anxiety  ASSESSMENT AND PLAN:  Discussed the following assessment and plan:  Rhinosinusitis  Type 2 diabetes mellitus without complication, without long-term current use of insulin (HCC) - Plan: Hemoglobin A1c  Obesity (BMI 30-39.9)  Hypertension associated with diabetes ([place]) - Plan: Basic metabolic panel, CBC  Hyperlipidemia associated with type 2 diabetes mellitus ([place]) - Plan: Lipid panel  -nasal saline, flonase, tessalon for likely viral or allergic rhinitis -lifestyle recs -assistant to contact [facility] for recs and place referral as needed -labs today -due for CPe -advised assistant to update HM -Patient advised to return or notify a doctor immediately if symptoms worsen or persist or new concerns arise.  Patient Instructions  BEFORE YOU LEAVE: -recheck BP --> if greater then 120/90 let her know will need to adjust medications -labs -check on colonoscopy recs - refer to [place] based on recs -obtain/abstract pap and eye report -follow up: CPE 1 month  We have ordered labs or studies at this visit. It can take up to 1-2 weeks for results and processing. IF results require follow up or explanation, we will call you with instructions. Clinically stable results will be released to your [facility]. If you have not heard from [place] or cannot find your results in [facility] in 2 weeks please contact our office at [phone].  If you are  not yet signed up for [facility], please consider signing up.  Flonase daily, nasal saline, tessalon as needed.  I hope you are feeling better soon! Seek care promptly if your symptoms worsen, new concerns arise or you are not improving with treatment.   We recommend the following healthy lifestyle for LIFE: 1) Small portions. But, make sure to get regular (at least 3 per day), healthy meals and small healthy snacks if needed.  2) Eat a healthy clean diet.   TRY TO EAT: -at least 5-7 servings of low sugar, colorful, and nutrient rich vegetables per day (not corn, potatoes or bananas.) -berries are the best choice if you wish to eat fruit (only eat small amounts if trying to reduce weight)  -lean meets (fish, white meat of chicken or [place]) -vegan proteins for some meals - beans or tofu, whole grains, nuts and seeds -Replace bad fats with good fats - good fats include: fish, nuts and seeds, canola oil, olive oil -small amounts of low fat or non fat dairy -small amounts of100 % whole grains - check the lables -drink plenty of water  AVOID: -SUGAR,  sweets, anything with added sugar, corn syrup or sweeteners - must read labels as even foods advertised as "healthy" often are loaded with sugar -if you must have a sweetener, small amounts of stevia may be best -sweetened beverages and artificially sweetened beverages -simple starches (rice, bread, potatoes, pasta, chips, etc - small amounts of 100% whole grains are ok) -red meat, pork, butter -fried foods, fast food, processed food, excessive dairy, eggs and coconut.  3)Get at least 150 minutes of sweaty aerobic exercise per week.  4)Reduce stress - consider counseling, meditation and relaxation to balance other aspects of your life.         Lucretia Kern, DO

## 2017-08-02 ENCOUNTER — Other Ambulatory Visit: Payer: Self-pay | Admitting: Family Medicine

## 2017-08-02 ENCOUNTER — Ambulatory Visit: Payer: 59 | Admitting: Family Medicine

## 2017-08-02 ENCOUNTER — Encounter: Payer: Self-pay | Admitting: Family Medicine

## 2017-08-02 ENCOUNTER — Telehealth: Payer: Self-pay | Admitting: *Deleted

## 2017-08-02 VITALS — BP 122/92 | HR 92 | Temp 98.3°F | Ht 63.0 in | Wt 196.0 lb

## 2017-08-02 DIAGNOSIS — Z1211 Encounter for screening for malignant neoplasm of colon: Secondary | ICD-10-CM

## 2017-08-02 DIAGNOSIS — E1169 Type 2 diabetes mellitus with other specified complication: Secondary | ICD-10-CM

## 2017-08-02 DIAGNOSIS — J310 Chronic rhinitis: Secondary | ICD-10-CM

## 2017-08-02 DIAGNOSIS — E1159 Type 2 diabetes mellitus with other circulatory complications: Secondary | ICD-10-CM | POA: Diagnosis not present

## 2017-08-02 DIAGNOSIS — J329 Chronic sinusitis, unspecified: Secondary | ICD-10-CM

## 2017-08-02 DIAGNOSIS — E119 Type 2 diabetes mellitus without complications: Secondary | ICD-10-CM

## 2017-08-02 DIAGNOSIS — E785 Hyperlipidemia, unspecified: Secondary | ICD-10-CM

## 2017-08-02 DIAGNOSIS — E669 Obesity, unspecified: Secondary | ICD-10-CM | POA: Diagnosis not present

## 2017-08-02 DIAGNOSIS — I10 Essential (primary) hypertension: Secondary | ICD-10-CM | POA: Diagnosis not present

## 2017-08-02 DIAGNOSIS — I152 Hypertension secondary to endocrine disorders: Secondary | ICD-10-CM

## 2017-08-02 LAB — BASIC METABOLIC PANEL
BUN: 12 mg/dL (ref 6–23)
CALCIUM: 9.8 mg/dL (ref 8.4–10.5)
CHLORIDE: 101 meq/L (ref 96–112)
CO2: 30 meq/L (ref 19–32)
CREATININE: 0.61 mg/dL (ref 0.40–1.20)
GFR: 128.83 mL/min (ref >60.00)
Glucose, Bld: 251 mg/dL — ABNORMAL HIGH (ref 70–99)
Potassium: 4 mEq/L (ref 3.5–5.1)
SODIUM: 138 meq/L (ref 135–145)

## 2017-08-02 LAB — CBC
HCT: 37.8 % (ref 36.0–46.0)
Hemoglobin: 12.2 g/dL (ref 12.0–15.0)
MCHC: 32.2 g/dL (ref 30.0–36.0)
MCV: 81 fl (ref 78.0–100.0)
PLATELETS: 307 10*3/uL (ref 150.0–400.0)
RBC: 4.67 Mil/uL (ref 3.87–5.11)
RDW: 13.2 % (ref 11.5–15.5)
WBC: 6.5 10*3/uL (ref 4.0–10.5)

## 2017-08-02 LAB — LIPID PANEL
CHOLESTEROL: 173 mg/dL (ref 0–200)
HDL: 49.6 mg/dL (ref >39.00)
LDL CALC: 107 mg/dL — AB (ref 0–99)
NonHDL: 122.94
Total CHOL/HDL Ratio: 3
Triglycerides: 79 mg/dL (ref 0.0–149.0)
VLDL: 15.8 mg/dL (ref 0.0–40.0)

## 2017-08-02 LAB — HEMOGLOBIN A1C: HEMOGLOBIN A1C: 11 % — AB (ref 4.6–6.5)

## 2017-08-02 MED ORDER — FLUTICASONE PROPIONATE 50 MCG/ACT NA SUSP: 2.0000 | Freq: Every day | NASAL | 5 refills | Status: AC

## 2017-08-02 MED ORDER — TRIAMTERENE-HCTZ 37.5-25 MG PO TABS: 1.0000 | ORAL_TABLET | Freq: Every day | ORAL | 0 refills | Status: DC

## 2017-08-02 MED ORDER — BENZONATATE 200 MG PO CAPS: 200.0000 mg | ORAL_CAPSULE | Freq: Two times a day (BID) | ORAL | 0 refills | Status: DC | PRN

## 2017-08-02 MED ORDER — LISINOPRIL 30 MG PO TABS
30.0000 mg | ORAL_TABLET | Freq: Every day | ORAL | 1 refills | Status: DC
Start: 2017-08-02 — End: 2018-02-06

## 2017-08-02 MED FILL — LISINOPRIL 30 MG TABLET: 30 | 90 days supply | Qty: 90 | Fill #0

## 2017-08-02 MED FILL — FLUTICASONE PROP 50 MCG SPR: 50 | 30 days supply | Qty: 16 | Fill #0

## 2017-08-02 MED FILL — BENZONATATE 200 MG CAPS: 200 | 10 days supply | Qty: 20 | Fill #0

## 2017-08-02 MED FILL — TRIAMTERENE-HCTZ 37.5-25 MG: 37.5-25 | 90 days supply | Qty: 90 | Fill #0

## 2017-08-02 NOTE — Telephone Encounter (Signed)
I called the pt and left a detailed message at her cell number with the information below.

## 2017-08-02 NOTE — Addendum Note (Signed)
Addended by: Agnes Lawrence on: 08/02/2017 10:08 AM   Modules accepted: Orders

## 2017-08-02 NOTE — Telephone Encounter (Signed)
Per Dr Maudie Mercury I called [facility] at [phone] to check the date for recommendations of the next colonoscopy.  Per Ubaldo Glassing the pt was due 01/2017 as per the report it was recommended to repeat in 3 years.  Dr Maudie Mercury was informed of this.

## 2017-08-02 NOTE — Telephone Encounter (Signed)
Rx sent for Fluticasone.  BP med refill request sent to Dr Maudie Mercury.

## 2017-08-02 NOTE — Patient Instructions (Addendum)
BEFORE YOU LEAVE: -recheck BP --> if greater then 120/90 let her know will need to adjust medications -labs -check on colonoscopy recs - refer to [facility] based on recs -obtain/abstract pap and eye report -follow up: CPE 1 month  We have ordered labs or studies at this visit. It can take up to 1-2 weeks for results and processing. IF results require follow up or explanation, we will call you with instructions. Clinically stable results will be released to your [facility]. If you have not heard from [place] or cannot find your results in [facility] in 2 weeks please contact our office at [phone].  If you are not yet signed up for [facility], please consider signing up.  Flonase daily, nasal saline, tessalon as needed.  I hope you are feeling better soon! Seek care promptly if your symptoms worsen, new concerns arise or you are not improving with treatment.   We recommend the following healthy lifestyle for LIFE: 1) Small portions. But, make sure to get regular (at least 3 per day), healthy meals and small healthy snacks if needed.  2) Eat a healthy clean diet.   TRY TO EAT: -at least 5-7 servings of low sugar, colorful, and nutrient rich vegetables per day (not corn, potatoes or bananas.) -berries are the best choice if you wish to eat fruit (only eat small amounts if trying to reduce weight)  -lean meets (fish, white meat of chicken or [place]) -vegan proteins for some meals - beans or tofu, whole grains, nuts and seeds -Replace bad fats with good fats - good fats include: fish, nuts and seeds, canola oil, olive oil -small amounts of low fat or non fat dairy -small amounts of100 % whole grains - check the lables -drink plenty of water  AVOID: -SUGAR, sweets, anything with added sugar, corn syrup or sweeteners - must read labels as even foods advertised as "healthy" often are loaded with sugar -if you must have a sweetener, small amounts of stevia may be best -sweetened beverages and  artificially sweetened beverages -simple starches (rice, bread, potatoes, pasta, chips, etc - small amounts of 100% whole grains are ok) -red meat, pork, butter -fried foods, fast food, processed food, excessive dairy, eggs and coconut.  3)Get at least 150 minutes of sweaty aerobic exercise per week.  4)Reduce stress - consider counseling, meditation and relaxation to balance other aspects of your life.

## 2017-08-02 NOTE — Addendum Note (Signed)
Addended by: Lucretia Kern on: 08/02/2017 11:05 AM   Modules accepted: Orders

## 2017-08-02 NOTE — Telephone Encounter (Signed)
Laura Marsh,  Let her know refills sent with mild increase in lisinopril dose given BP high. Make sure has appt in 1 month to recheck. Thanks.

## 2017-08-02 NOTE — Telephone Encounter (Signed)
Pt is requesting Rx's fluticasone,lisinopril and triamterene-hydrochlorothiazide  Pharm:  [place]

## 2017-08-02 NOTE — Addendum Note (Signed)
Addended by: Agnes Lawrence on: 08/02/2017 10:06 AM   Modules accepted: Orders

## 2017-08-04 ENCOUNTER — Telehealth: Payer: Self-pay | Admitting: Gastroenterology

## 2017-08-05 NOTE — Telephone Encounter (Signed)
Printed last colon report and placed on Dr. Lynne Leader desk for review.  Dr. Fuller Plan is needing path report. Called and LM on Vmail for patient to call back to see if we can get last path report.

## 2017-08-11 ENCOUNTER — Other Ambulatory Visit: Payer: Self-pay | Admitting: *Deleted

## 2017-08-11 MED ORDER — FREESTYLE LIBRE SENSOR SYSTEM MISC: 3 refills | Status: DC

## 2017-08-11 MED ORDER — FREESTYLE LIBRE READER DEVI: 1.0000 | 3 refills | Status: DC

## 2017-08-11 NOTE — Telephone Encounter (Signed)
Rx done. 

## 2017-08-16 ENCOUNTER — Other Ambulatory Visit: Payer: Self-pay | Admitting: Family Medicine

## 2017-08-16 MED FILL — JANUVIA 100 MG TABLET: 100 | 30 days supply | Qty: 30 | Fill #0

## 2017-08-16 NOTE — Progress Notes (Deleted)
  HPI:  Using dictation device. Unfortunately this device frequently misinterprets words/phrases.  Acute visit to discuss:  Diabetes: -extremely poor control and poor compliance with recommendation in the past -hgba1c 11 recent check 07/2017 -ldl cholesterol also above goal -renal function ok on recent check -current meds: 1000mg  metformin daily (reports dose not tolerate higher dose), [place] 100mg  daily -poor diet and no regular exercise -hx htn, hyperlipidemia, obesity  ROS: See pertinent positives and negatives per HPI.  Past Medical History:  Diagnosis Date  . Diabetes mellitus   . Elevated cholesterol   . Hypertension     Past Surgical History:  Procedure Laterality Date  . ABDOMINAL HYSTERECTOMY      Family History  Problem Relation Age of Onset  . Diabetes Mother   . Congestive Heart Failure Mother     SOCIAL HX: ***   Current Outpatient Medications:  .  Alcohol Swabs PADS, Use as directed., Disp: 100 each, Rfl: 12 .  benzonatate (TESSALON) 200 MG capsule, Take 1 capsule (200 mg total) by mouth 2 (two) times daily as needed for cough., Disp: 20 capsule, Rfl: 0 .  Continuous Blood Gluc Receiver (FREESTYLE LIBRE READER) DEVI, 1 Device by Other route as directed., Disp: 1 Device, Rfl: 3 .  Continuous Blood Gluc Sensor ([place]) MISC, Use as directed to check blood sugar once a day, Disp: 3 each, Rfl: 3 .  Continuous Blood Gluc Sensor ([place]) MISC, Use as directed to check blood sugar once a day, Disp: 3 each, Rfl: 3 .  fluticasone (FLONASE) 50 MCG/ACT nasal spray, Place 2 sprays into both nostrils daily., Disp: 16 g, Rfl: 5 .  glucose blood (TRUETEST TEST) test strip, TEST TWICE WEEKLY., Disp: 100 each, Rfl: 3 .  JANUVIA 100 MG tablet, TAKE 1 TABLET BY MOUTH ONCE DAILY, Disp: 30 tablet, Rfl: 0 .  lisinopril (PRINIVIL,ZESTRIL) 30 MG tablet, Take 1 tablet (30 mg total) by mouth daily., Disp: 90 tablet, Rfl: 1 .   loratadine (CLARITIN) 10 MG tablet, Take 10 mg by mouth daily., Disp: , Rfl:  .  metFORMIN (GLUCOPHAGE) 1000 MG tablet, TAKE 1 TABLET BY MOUTH TWICE DAILY WITH MEALS. **NEEDS APPOINTMENT FOR MORE REFILLS**, Disp: 60 tablet, Rfl: 5 .  pravastatin (PRAVACHOL) 40 MG tablet, TAKE 1 TABLET BY MOUTH ONCE DAILY (NEED APPT), Disp: 90 tablet, Rfl: 1 .  triamterene-hydrochlorothiazide (MAXZIDE-25) 37.5-25 MG tablet, Take 1 tablet by mouth daily., Disp: 90 tablet, Rfl: 0 .  TRUEPLUS LANCETS 30G MISC, USE AS DIRECTED 2 TIMES A WEEK, Disp: 100 each, Rfl: 3  EXAM:  There were no vitals filed for this visit.  There is no height or weight on file to calculate BMI.  GENERAL: vitals reviewed and listed above, alert, oriented, appears well hydrated and in no acute distress  HEENT: atraumatic, conjunttiva clear, no obvious abnormalities on inspection of external nose and ears  NECK: no obvious masses on inspection  LUNGS: clear to auscultation bilaterally, no wheezes, rales or rhonchi, good air movement  CV: HRRR, no peripheral edema  MS: moves all extremities without noticeable abnormality *** PSYCH: pleasant and cooperative, no obvious depression or anxiety  ASSESSMENT AND PLAN:  Discussed the following assessment and plan:  No diagnosis found.  *** -Patient advised to return or notify a doctor immediately if symptoms worsen or persist or new concerns arise.  There are no Patient Instructions on file for this visit.  Lucretia Kern, DO

## 2017-08-18 ENCOUNTER — Ambulatory Visit: Payer: 59 | Admitting: Family Medicine

## 2017-08-23 NOTE — Progress Notes (Signed)
HPI:  Using dictation device. Unfortunately this device frequently misinterprets words/phrases.  Acute visit for diabetes and hyperlipidemia: -long hx of poor compliance and poor control with sig worsening hgba1c on recent labs to 11.0, LDL 107, Cr 0.61 -also PMH morbid obesity, hypertension -hx very poor diet and inadequate exercise - reports is making changes and prefers this to more medications -meds:metformin 100 daily (reports does not tolerate dose), januvia, lisinopril, pravastatin,   ROS: See pertinent positives and negatives per HPI.  Past Medical History:  Diagnosis Date  . Diabetes mellitus   . Elevated cholesterol   . Hypertension     Past Surgical History:  Procedure Laterality Date  . ABDOMINAL HYSTERECTOMY      Family History  Problem Relation Age of Onset  . Diabetes Mother   . Congestive Heart Failure Mother     SOCIAL HX: see hpi   Current Outpatient Medications:  .  Alcohol Swabs PADS, Use as directed., Disp: 100 each, Rfl: 12 .  benzonatate (TESSALON) 200 MG capsule, Take 1 capsule (200 mg total) by mouth 2 (two) times daily as needed for cough., Disp: 20 capsule, Rfl: 0 .  Continuous Blood Gluc Receiver (FREESTYLE LIBRE READER) DEVI, 1 Device by Other route as directed., Disp: 1 Device, Rfl: 3 .  Continuous Blood Gluc Sensor ([place]) MISC, Use as directed to check blood sugar once a day, Disp: 3 each, Rfl: 3 .  Continuous Blood Gluc Sensor ([place]) MISC, Use as directed to check blood sugar once a day, Disp: 3 each, Rfl: 3 .  fluticasone (FLONASE) 50 MCG/ACT nasal spray, Place 2 sprays into both nostrils daily., Disp: 16 g, Rfl: 5 .  glucose blood (TRUETEST TEST) test strip, TEST TWICE WEEKLY., Disp: 100 each, Rfl: 3 .  JANUVIA 100 MG tablet, TAKE 1 TABLET BY MOUTH ONCE DAILY (NEED APPT), Disp: 30 tablet, Rfl: 0 .  lisinopril (PRINIVIL,ZESTRIL) 30 MG tablet, Take 1 tablet (30 mg total) by mouth daily.,  Disp: 90 tablet, Rfl: 1 .  loratadine (CLARITIN) 10 MG tablet, Take 10 mg by mouth daily., Disp: , Rfl:  .  metFORMIN (GLUCOPHAGE) 1000 MG tablet, TAKE 1 TABLET BY MOUTH TWICE DAILY WITH MEALS., Disp: 180 tablet, Rfl: 1 .  triamterene-hydrochlorothiazide (MAXZIDE-25) 37.5-25 MG tablet, Take 1 tablet by mouth daily., Disp: 90 tablet, Rfl: 0 .  TRUEPLUS LANCETS 30G MISC, USE AS DIRECTED 2 TIMES A WEEK, Disp: 100 each, Rfl: 3 .  atorvastatin (LIPITOR) 20 MG tablet, Take 1 tablet (20 mg total) by mouth daily., Disp: 90 tablet, Rfl: 3 .  glipiZIDE (GLUCOTROL XL) 5 MG 24 hr tablet, Take 1 tablet (5 mg total) by mouth daily with breakfast., Disp: 90 tablet, Rfl: 1  EXAM:  Vitals:   08/25/17 0659  BP: 118/82  Pulse: 92  Temp: 98.2 F (36.8 C)    Body mass index is 34.47 kg/m.  GENERAL: vitals reviewed and listed above, alert, oriented, appears well hydrated and in no acute distress  HEENT: atraumatic, conjunttiva clear, no obvious abnormalities on inspection of external nose and ears  NECK: no obvious masses on inspection  LUNGS: clear to auscultation bilaterally, no wheezes, rales or rhonchi, good air movement  CV: HRRR, no peripheral edema  MS: moves all extremities without noticeable abnormality  PSYCH: pleasant and cooperative, no obvious depression or anxiety  ASSESSMENT AND PLAN:  Discussed the following assessment and plan:  Type 2 diabetes mellitus without complication, without long-term current use of insulin (  [place])  Hyperlipidemia associated with type 2 diabetes mellitus ([place])  Morbid obesity ([place])  Hypertension associated with diabetes ([place])  -lengthy discussion face to face > 50% of 40 minute visit in counseling, about the implications of diabetes and the need to control with lifestyle changes and medications -she is adamantly against any injected medications - discussed other medication options and risks -she wants to work aggressively on lifestyle changes and add  glipizide and work on taking current medication more regularly -agrees to statin change -advised healthy Mediterranean low sugar diet and discussed her current diet and options, advised regular aerobic exercise -Patient advised to return or notify a doctor immediately if symptoms worsen or persist or new concerns arise.  Patient Instructions  BEFORE YOU LEAVE: -follow up: 90 days  Continue Januvia daily, Metformin twice daily and ADD: -Glipizide once daily  STOP pravastatin  ADD lipitor once daily  Eat a very healthy low sugar diet.   Get at least 150 minutes of sweaty aerobic exercise daily.      We recommend the following healthy lifestyle for LIFE: 1) Small portions. But, make sure to get regular (at least 3 per day), healthy meals and small healthy snacks if needed.  2) Eat a healthy clean diet.   TRY TO EAT: -at least 5-7 servings of low sugar, colorful, and nutrient rich vegetables per day (not corn, potatoes or bananas.) -berries are the best choice if you wish to eat fruit (only eat small amounts if trying to reduce weight)  -lean meets (fish, white meat of chicken or [place]) -vegan proteins for some meals - beans or tofu, whole grains, nuts and seeds -Replace bad fats with good fats - good fats include: fish, nuts and seeds, canola oil, olive oil -small amounts of low fat or non fat dairy -small amounts of100 % whole grains - check the lables -drink plenty of water  AVOID: -SUGAR, sweets, anything with added sugar, corn syrup or sweeteners - must read labels as even foods advertised as "healthy" often are loaded with sugar -if you must have a sweetener, small amounts of stevia may be best -sweetened beverages and artificially sweetened beverages -simple starches (rice, bread, potatoes, pasta, chips, etc - small amounts of 100% whole grains are ok) -red meat, pork, butter -fried foods, fast food, processed food, excessive dairy, eggs and coconut.  3)Get at least  150 minutes of sweaty aerobic exercise per week.  4)Reduce stress - consider counseling, meditation and relaxation to balance other aspects of your life.     Lucretia Kern, DO

## 2017-08-24 MED FILL — metFORMIN HCL 1000 MG TABS: 1000 | 30 days supply | Qty: 60 | Fill #4

## 2017-08-25 ENCOUNTER — Telehealth: Payer: Self-pay | Admitting: *Deleted

## 2017-08-25 ENCOUNTER — Encounter: Payer: Self-pay | Admitting: Family Medicine

## 2017-08-25 ENCOUNTER — Ambulatory Visit: Payer: 59 | Admitting: Family Medicine

## 2017-08-25 VITALS — BP 118/82 | HR 92 | Temp 98.2°F | Ht 63.0 in | Wt 194.6 lb

## 2017-08-25 DIAGNOSIS — E1169 Type 2 diabetes mellitus with other specified complication: Secondary | ICD-10-CM | POA: Insufficient documentation

## 2017-08-25 DIAGNOSIS — E119 Type 2 diabetes mellitus without complications: Secondary | ICD-10-CM | POA: Diagnosis not present

## 2017-08-25 DIAGNOSIS — E1159 Type 2 diabetes mellitus with other circulatory complications: Secondary | ICD-10-CM | POA: Diagnosis not present

## 2017-08-25 DIAGNOSIS — I10 Essential (primary) hypertension: Secondary | ICD-10-CM

## 2017-08-25 DIAGNOSIS — E785 Hyperlipidemia, unspecified: Secondary | ICD-10-CM

## 2017-08-25 DIAGNOSIS — Z1231 Encounter for screening mammogram for malignant neoplasm of breast: Secondary | ICD-10-CM | POA: Diagnosis not present

## 2017-08-25 DIAGNOSIS — I152 Hypertension secondary to endocrine disorders: Secondary | ICD-10-CM

## 2017-08-25 MED ORDER — METFORMIN HCL 1000 MG PO TABS: ORAL_TABLET | ORAL | 1 refills | Status: DC

## 2017-08-25 MED ORDER — GLIPIZIDE ER 5 MG PO TB24: 5.0000 mg | ORAL_TABLET | Freq: Every day | ORAL | 1 refills | Status: DC

## 2017-08-25 MED ORDER — ATORVASTATIN CALCIUM 20 MG PO TABS: 20.0000 mg | ORAL_TABLET | Freq: Every day | ORAL | 3 refills | Status: DC

## 2017-08-25 MED FILL — ATORVASTATIN 20 MG TABLET: 20 | 90 days supply | Qty: 90 | Fill #0

## 2017-08-25 MED FILL — glipiZIDE ER 5 MG TB24: 5 | 90 days supply | Qty: 90 | Fill #0

## 2017-08-25 NOTE — Telephone Encounter (Signed)
Prior auth for [employer] reader device sent to [email or web address].

## 2017-08-25 NOTE — Patient Instructions (Signed)
BEFORE YOU LEAVE: -follow up: 90 days  Continue Januvia daily, Metformin twice daily and ADD: -Glipizide once daily  STOP pravastatin  ADD lipitor once daily  Eat a very healthy low sugar diet.   Get at least 150 minutes of sweaty aerobic exercise daily.      We recommend the following healthy lifestyle for LIFE: 1) Small portions. But, make sure to get regular (at least 3 per day), healthy meals and small healthy snacks if needed.  2) Eat a healthy clean diet.   TRY TO EAT: -at least 5-7 servings of low sugar, colorful, and nutrient rich vegetables per day (not corn, potatoes or bananas.) -berries are the best choice if you wish to eat fruit (only eat small amounts if trying to reduce weight)  -lean meets (fish, white meat of chicken or [place]) -vegan proteins for some meals - beans or tofu, whole grains, nuts and seeds -Replace bad fats with good fats - good fats include: fish, nuts and seeds, canola oil, olive oil -small amounts of low fat or non fat dairy -small amounts of100 % whole grains - check the lables -drink plenty of water  AVOID: -SUGAR, sweets, anything with added sugar, corn syrup or sweeteners - must read labels as even foods advertised as "healthy" often are loaded with sugar -if you must have a sweetener, small amounts of stevia may be best -sweetened beverages and artificially sweetened beverages -simple starches (rice, bread, potatoes, pasta, chips, etc - small amounts of 100% whole grains are ok) -red meat, pork, butter -fried foods, fast food, processed food, excessive dairy, eggs and coconut.  3)Get at least 150 minutes of sweaty aerobic exercise per week.  4)Reduce stress - consider counseling, meditation and relaxation to balance other aspects of your life.

## 2017-09-06 ENCOUNTER — Encounter: Payer: 59 | Admitting: Family Medicine

## 2017-09-09 ENCOUNTER — Ambulatory Visit: Payer: Self-pay | Admitting: *Deleted

## 2017-09-09 NOTE — Telephone Encounter (Signed)
Fax received from [place] stating the request was denied and this was given to Dr Maudie Mercury.

## 2017-09-09 NOTE — Telephone Encounter (Signed)
Called in c/o her BP running higher than her usual readings since her medications were changed during her last office visit.  She also c/o having a headache that lasted 3 days.   She took Ibuprofen which relieved the headache.   She c/o having a headache today but has not taken any Ibuprofen.   See triage notes for BP readings.  I scheduled her with Dr. Maudie Mercury for 09/13/17 at 9:30am.  I went over the s/s to be aware of and go to the ED from the care advice notes.   She verbalized understanding.   Reason for Disposition . [7] Systolic BP  >= 096 OR Diastolic >= 80  AND [2] history of heart problems, kidney disease or diabetes  Answer Assessment - Initial Assessment Questions 1. BLOOD PRESSURE: "What is the blood pressure?" "Did you take at least two measurements 5 minutes apart?"     135/88 this morning with upper arm cuff.   140/95 2nd BP with cuff 2. ONSET: "When did you take your blood pressure?"     This morning.   Before starting the medication my BP was never this high. 3. HOW: "How did you obtain the blood pressure?" (e.g., visiting nurse, automatic home BP monitor)     BP cuff on arm.   Has a wrist monitor but used the cuff. 4. HISTORY: "Do you have a history of high blood pressure?"     Yes. 5. MEDICATIONS: "Are you taking any medications for blood pressure?" "Have you missed any doses recently?"     Yes.   Dr. Maudie Mercury changed some of my medications around. 6. OTHER SYMPTOMS: "Do you have any symptoms?" (e.g., headache, chest pain, blurred vision, difficulty breathing, weakness)     I've had a headache for 3 days earlier part of the week.   I took Ibuprofen and it helped.   Today the headache came back.   It's not bad. 7. PREGNANCY: "Is there any chance you are pregnant?" "When was your last menstrual period?"     N/A  Protocols used: HIGH BLOOD PRESSURE-A-AH

## 2017-09-09 NOTE — Telephone Encounter (Signed)
Please let pt know insurance does not cover. Can do a different meter. Thanks.

## 2017-09-12 NOTE — Progress Notes (Signed)
HPI:  Using dictation device. Unfortunately this device frequently misinterprets words/phrases.  Acute visit regarding HTN: -recently started glipizide and changed statin for uncontrolled diabetes and HLD -reports: BP in 130s/90 at work on phone watch and had a headache several times - she increased the lisinopril to 30 mg a few weeks ago -she also also concerned about wt gain with glipizide, but she does not want to take insulin, actos or a number of the newer meds -denies: CP, SOB, DOE -BP meds include lisinopril, triam-hctz from prior pcp  ROS: See pertinent positives and negatives per HPI.  Past Medical History:  Diagnosis Date  . Diabetes mellitus   . Elevated cholesterol   . Hypertension     Past Surgical History:  Procedure Laterality Date  . ABDOMINAL HYSTERECTOMY      Family History  Problem Relation Age of Onset  . Diabetes Mother   . Congestive Heart Failure Mother     SOCIAL HX: see hpi   Current Outpatient Medications:  .  Alcohol Swabs PADS, Use as directed., Disp: 100 each, Rfl: 12 .  atorvastatin (LIPITOR) 20 MG tablet, Take 1 tablet (20 mg total) by mouth daily., Disp: 90 tablet, Rfl: 3 .  Continuous Blood Gluc Receiver (FREESTYLE LIBRE READER) DEVI, 1 Device by Other route as directed., Disp: 1 Device, Rfl: 3 .  Continuous Blood Gluc Sensor ([place]) MISC, Use as directed to check blood sugar once a day, Disp: 3 each, Rfl: 3 .  Continuous Blood Gluc Sensor ([place]) MISC, Use as directed to check blood sugar once a day, Disp: 3 each, Rfl: 3 .  fluticasone (FLONASE) 50 MCG/ACT nasal spray, Place 2 sprays into both nostrils daily., Disp: 16 g, Rfl: 5 .  glipiZIDE (GLUCOTROL XL) 5 MG 24 hr tablet, Take 1 tablet (5 mg total) by mouth daily with breakfast., Disp: 90 tablet, Rfl: 1 .  glucose blood (TRUETEST TEST) test strip, TEST TWICE WEEKLY., Disp: 100 each, Rfl: 3 .  JANUVIA 100 MG tablet, TAKE 1 TABLET BY MOUTH  ONCE DAILY (NEED APPT), Disp: 30 tablet, Rfl: 5 .  lisinopril (PRINIVIL,ZESTRIL) 30 MG tablet, Take 1 tablet (30 mg total) by mouth daily., Disp: 90 tablet, Rfl: 1 .  loratadine (CLARITIN) 10 MG tablet, Take 10 mg by mouth daily., Disp: , Rfl:  .  metFORMIN (GLUCOPHAGE) 1000 MG tablet, TAKE 1 TABLET BY MOUTH TWICE DAILY WITH MEALS., Disp: 180 tablet, Rfl: 1 .  triamterene-hydrochlorothiazide (MAXZIDE-25) 37.5-25 MG tablet, Take 1 tablet by mouth daily., Disp: 90 tablet, Rfl: 0 .  TRUEPLUS LANCETS 30G MISC, USE AS DIRECTED 2 TIMES A WEEK, Disp: 100 each, Rfl: 3  EXAM:  Vitals:   09/13/17 0921  BP: 120/84  Pulse: 95  Temp: 98.3 F (36.8 C)    Body mass index is 34.63 kg/m.  GENERAL: vitals reviewed and listed above, alert, oriented, appears well hydrated and in no acute distress  HEENT: atraumatic, conjunttiva clear, no obvious abnormalities on inspection of external nose and ears  NECK: no obvious masses on inspection  LUNGS: clear to auscultation bilaterally, no wheezes, rales or rhonchi, good air movement  CV: HRRR, no peripheral edema  MS: moves all extremities without noticeable abnormality  PSYCH: pleasant and cooperative, no obvious depression or anxiety  ASSESSMENT AND PLAN:  Discussed the following assessment and plan:  Type 2 diabetes mellitus without complication, without long-term current use of insulin (HCC)  Hypertension associated with diabetes ([place])  Hyperlipidemia associated with  type 2 diabetes mellitus ([place])  Morbid obesity ([place])  -discussed options -she decided at this point to continue current meds and work on lifestyle - I think this is a good option for her as she has reluctance regarding meds and seems sensitive to side effects -consider endo referral if more diabetes meds needed -she is to monitor with better BP cuff and let us know if BP continues to run high and we will increase the lisinopril further and consider other options -preferable  she will adhere to a healthy low sugar diet, get regular exercise and lose 10-20lbs - but she reports this " is hard" -follow up sooner as needed  Patient Instructions  BEFORE YOU LEAVE: -follow up:  1) cancel follow up in August but KEEP physical 2) schedule follow up in 1 month in July for blood pressure  Keep and eye on the blood pressure and send a message to Wendie Simmer if continues to run high and you want to increase the lisinopril to 40mg .  Eat a healthy low sugar diet and get regular aerobic exercise.   We recommend the following healthy lifestyle for LIFE: 1) Small portions. But, make sure to get regular (at least 3 per day), healthy meals and small healthy snacks if needed.  2) Eat a healthy clean diet.   TRY TO EAT: -at least 5-7 servings of low sugar, colorful, and nutrient rich vegetables per day (not corn, potatoes or bananas.) -berries are the best choice if you wish to eat fruit (only eat small amounts if trying to reduce weight)  -lean meets (fish, white meat of chicken or [place]) -vegan proteins for some meals - beans or tofu, whole grains, nuts and seeds -Replace bad fats with good fats - good fats include: fish, nuts and seeds, canola oil, olive oil -small amounts of low fat or non fat dairy -small amounts of100 % whole grains - check the lables -drink plenty of water  AVOID: -SUGAR, sweets, anything with added sugar, corn syrup or sweeteners - must read labels as even foods advertised as "healthy" often are loaded with sugar -if you must have a sweetener, small amounts of stevia may be best -sweetened beverages and artificially sweetened beverages -simple starches (rice, bread, potatoes, pasta, chips, etc - small amounts of 100% whole grains are ok) -red meat, pork, butter -fried foods, fast food, processed food, excessive dairy, eggs and coconut.  3)Get at least 150 minutes of sweaty aerobic exercise per week.  4)Reduce stress - consider counseling,  meditation and relaxation to balance other aspects of your life.     Lucretia Kern, DO

## 2017-09-13 ENCOUNTER — Ambulatory Visit: Payer: 59 | Admitting: Family Medicine

## 2017-09-13 ENCOUNTER — Other Ambulatory Visit: Payer: Self-pay | Admitting: Family Medicine

## 2017-09-13 ENCOUNTER — Encounter: Payer: Self-pay | Admitting: Family Medicine

## 2017-09-13 VITALS — BP 120/84 | HR 95 | Temp 98.3°F | Ht 63.0 in | Wt 195.5 lb

## 2017-09-13 DIAGNOSIS — E1159 Type 2 diabetes mellitus with other circulatory complications: Secondary | ICD-10-CM

## 2017-09-13 DIAGNOSIS — I10 Essential (primary) hypertension: Secondary | ICD-10-CM

## 2017-09-13 DIAGNOSIS — E119 Type 2 diabetes mellitus without complications: Secondary | ICD-10-CM

## 2017-09-13 DIAGNOSIS — E1169 Type 2 diabetes mellitus with other specified complication: Secondary | ICD-10-CM | POA: Diagnosis not present

## 2017-09-13 DIAGNOSIS — E785 Hyperlipidemia, unspecified: Secondary | ICD-10-CM

## 2017-09-13 DIAGNOSIS — I152 Hypertension secondary to endocrine disorders: Secondary | ICD-10-CM

## 2017-09-13 MED FILL — JANUVIA 100 MG TABLET: 100 | 30 days supply | Qty: 30 | Fill #0

## 2017-09-13 NOTE — Patient Instructions (Signed)
BEFORE YOU LEAVE: -follow up:  1) cancel follow up in August but KEEP physical 2) schedule follow up in 1 month in July for blood pressure  Keep and eye on the blood pressure and send a message to Wendie Simmer if continues to run high and you want to increase the lisinopril to 40mg .  Eat a healthy low sugar diet and get regular aerobic exercise.   We recommend the following healthy lifestyle for LIFE: 1) Small portions. But, make sure to get regular (at least 3 per day), healthy meals and small healthy snacks if needed.  2) Eat a healthy clean diet.   TRY TO EAT: -at least 5-7 servings of low sugar, colorful, and nutrient rich vegetables per day (not corn, potatoes or bananas.) -berries are the best choice if you wish to eat fruit (only eat small amounts if trying to reduce weight)  -lean meets (fish, white meat of chicken or [place]) -vegan proteins for some meals - beans or tofu, whole grains, nuts and seeds -Replace bad fats with good fats - good fats include: fish, nuts and seeds, canola oil, olive oil -small amounts of low fat or non fat dairy -small amounts of100 % whole grains - check the lables -drink plenty of water  AVOID: -SUGAR, sweets, anything with added sugar, corn syrup or sweeteners - must read labels as even foods advertised as "healthy" often are loaded with sugar -if you must have a sweetener, small amounts of stevia may be best -sweetened beverages and artificially sweetened beverages -simple starches (rice, bread, potatoes, pasta, chips, etc - small amounts of 100% whole grains are ok) -red meat, pork, butter -fried foods, fast food, processed food, excessive dairy, eggs and coconut.  3)Get at least 150 minutes of sweaty aerobic exercise per week.  4)Reduce stress - consider counseling, meditation and relaxation to balance other aspects of your life.

## 2017-09-16 NOTE — Telephone Encounter (Signed)
Patient informed at office visit on 6/11.

## 2017-09-23 MED FILL — metFORMIN HCL 1000 MG TABS: 1000 | 90 days supply | Qty: 180 | Fill #0

## 2017-09-27 ENCOUNTER — Ambulatory Visit (INDEPENDENT_AMBULATORY_CARE_PROVIDER_SITE_OTHER): Payer: 59 | Admitting: Family Medicine

## 2017-09-27 ENCOUNTER — Encounter: Payer: Self-pay | Admitting: Family Medicine

## 2017-09-27 VITALS — BP 122/80 | HR 90 | Temp 98.7°F | Ht 63.0 in | Wt 195.4 lb

## 2017-09-27 DIAGNOSIS — Z1331 Encounter for screening for depression: Secondary | ICD-10-CM

## 2017-09-27 DIAGNOSIS — Z0000 Encounter for general adult medical examination without abnormal findings: Secondary | ICD-10-CM | POA: Diagnosis not present

## 2017-09-27 DIAGNOSIS — E119 Type 2 diabetes mellitus without complications: Secondary | ICD-10-CM | POA: Diagnosis not present

## 2017-09-27 DIAGNOSIS — I152 Hypertension secondary to endocrine disorders: Secondary | ICD-10-CM

## 2017-09-27 DIAGNOSIS — E785 Hyperlipidemia, unspecified: Secondary | ICD-10-CM | POA: Diagnosis not present

## 2017-09-27 DIAGNOSIS — I10 Essential (primary) hypertension: Secondary | ICD-10-CM | POA: Diagnosis not present

## 2017-09-27 DIAGNOSIS — E1159 Type 2 diabetes mellitus with other circulatory complications: Secondary | ICD-10-CM

## 2017-09-27 DIAGNOSIS — E1169 Type 2 diabetes mellitus with other specified complication: Secondary | ICD-10-CM

## 2017-09-27 NOTE — Patient Instructions (Signed)
BEFORE YOU LEAVE: -Obtain and abstract mammogram report from [facility] and abstract colonoscopy recommendations from Dr. Ricky Stabs office -follow up: 2 to 3 months, cancel any other follow-ups scheduled at this point   We recommend the following healthy lifestyle for LIFE: 1) Small portions. But, make sure to get regular (at least 3 per day), healthy meals and small healthy snacks if needed.  2) Eat a healthy clean diet.   TRY TO EAT: -at least 5-7 servings of low sugar, colorful, and nutrient rich vegetables per day (not corn, potatoes or bananas.) -berries are the best choice if you wish to eat fruit (only eat small amounts if trying to reduce weight)  -lean meets (fish, white meat of chicken or [place]) -vegan proteins for some meals - beans or tofu, whole grains, nuts and seeds -Replace bad fats with good fats - good fats include: fish, nuts and seeds, canola oil, olive oil -small amounts of low fat or non fat dairy -small amounts of100 % whole grains - check the lables -drink plenty of water  AVOID: -SUGAR, sweets, anything with added sugar, corn syrup or sweeteners - must read labels as even foods advertised as "healthy" often are loaded with sugar -if you must have a sweetener, small amounts of stevia may be best -sweetened beverages and artificially sweetened beverages -simple starches (rice, bread, potatoes, pasta, chips, etc - small amounts of 100% whole grains are ok) -red meat, pork, butter -fried foods, fast food, processed food, excessive dairy, eggs and coconut.  3)Get at least 150 minutes of sweaty aerobic exercise per week.  4)Reduce stress - consider counseling, meditation and relaxation to balance other aspects of your life.  Preventive Care 40-64 Years, Female Preventive care refers to lifestyle choices and visits with your health care provider that can promote health and wellness. What does preventive care include?  A yearly physical exam. This is  also called an annual well check.  Dental exams once or twice a year.  Routine eye exams. Ask your health care provider how often you should have your eyes checked.  Personal lifestyle choices, including: ? Daily care of your teeth and gums. ? Regular physical activity. ? Eating a healthy diet. ? Avoiding tobacco and drug use. ? Limiting alcohol use. ? Practicing safe sex. ? Taking vitamin and mineral supplements as recommended by your health care provider. What happens during an annual well check? The services and screenings done by your health care provider during your annual well check will depend on your age, overall health, lifestyle risk factors, and family history of disease. Counseling Your health care provider may ask you questions about your:  Alcohol use.  Tobacco use.  Drug use.  Emotional well-being.  Home and relationship well-being.  Sexual activity.  Eating habits.  Work and work Statistician.  Method of birth control.  Menstrual cycle.  Pregnancy history.  Screening You may have the following tests or measurements:  Height, weight, and BMI.  Blood pressure.  Lipid and cholesterol levels. These may be checked every 5 years, or more frequently if you are over 82 years old.  Skin check.  Lung cancer screening. You may have this screening every year starting at age 47 if you have a 30-pack-year history of smoking and currently smoke or have quit within the past 15 years.  Fecal occult blood test (FOBT) of the stool. You may have this test every year starting at age 63.  Flexible sigmoidoscopy or colonoscopy. You may have a sigmoidoscopy  every 5 years or a colonoscopy every 10 years starting at age 70.  Hepatitis C blood test.  Hepatitis B blood test.  Sexually transmitted disease (STD) testing.  Diabetes screening. This is done by checking your blood sugar (glucose) after you have not eaten for a while (fasting). You may have this done every  1-3 years.  Mammogram. This may be done every 1-2 years. Talk to your health care provider about when you should start having regular mammograms. This may depend on whether you have a family history of breast cancer.  BRCA-related cancer screening. This may be done if you have a family history of breast, ovarian, tubal, or peritoneal cancers.  Pelvic exam and Pap test. This may be done every 3 years starting at age 36. Starting at age 33, this may be done every 5 years if you have a Pap test in combination with an HPV test.  Bone density scan. This is done to screen for osteoporosis. You may have this scan if you are at high risk for osteoporosis.  Discuss your test results, treatment options, and if necessary, the need for more tests with your health care provider. Vaccines Your health care provider may recommend certain vaccines, such as:  Influenza vaccine. This is recommended every year.  Tetanus, diphtheria, and acellular pertussis (Tdap, Td) vaccine. You may need a Td booster every 10 years.  Varicella vaccine. You may need this if you have not been vaccinated.  Zoster vaccine. You may need this after age 13.  Measles, mumps, and rubella (MMR) vaccine. You may need at least one dose of MMR if you were born in 1957 or later. You may also need a second dose.  Pneumococcal 13-valent conjugate (PCV13) vaccine. You may need this if you have certain conditions and were not previously vaccinated.  Pneumococcal polysaccharide (PPSV23) vaccine. You may need one or two doses if you smoke cigarettes or if you have certain conditions.  Meningococcal vaccine. You may need this if you have certain conditions.  Hepatitis A vaccine. You may need this if you have certain conditions or if you travel or work in places where you may be exposed to hepatitis A.  Hepatitis B vaccine. You may need this if you have certain conditions or if you travel or work in places where you may be exposed to  hepatitis B.  Haemophilus influenzae type b (Hib) vaccine. You may need this if you have certain conditions.  Talk to your health care provider about which screenings and vaccines you need and how often you need them. This information is not intended to replace advice given to you by your health care provider. Make sure you discuss any questions you have with your health care provider. Document Released: 04/18/2015 Document Revised: 12/10/2015 Document Reviewed: 01/21/2015 Elsevier Interactive Patient Education  [employer].

## 2017-09-27 NOTE — Progress Notes (Signed)
HPI:  Using dictation device. Unfortunately this device frequently misinterprets words/phrases.  Here for CPE: Due for pneumococcal vaccine, HIV,   -Concerns and/or follow up today:   Chronic medical problems summarized below were reviewed for changes.  Reports she has started to work on [employer], but that it is hard.  Her sister is visiting in reversed her diabetes with diet and exercise.  She is trying to learn her.  She is not exercising as "it has been too hot".  Reports she did her mammogram with her gynecologist earlier this year.  [facility]. Reports she sees Dr. Alice Reichert in GI and will be seeing him in a few days.  She is not sure if this is a follow-up for a repeat colonoscopy or for the hemorrhoids.  She cannot remember when her next colonoscopy was due.  According to epic it was due 10 years from the last one. She has not been checking her blood sugars.  Feels that her polyuria has improved.  Diabetes, Hyperlipidemia, Hypertension, obesity -long hx of poor compliance and poor control with sig worsening hgba1c on recent labs to 11.0, LDL 107, Cr 0.61 -hx very poor diet and inadequate exercise - reports is making changes and prefers this to more medications (refused more medications 08/2017/09/2017) -meds:metformin 100) daily (reports does not tolerate higher dose), januvia, lisinopril, lupitor, triam-hctz   -Diet: variety of foods, balance and well rounded, larger portion sizes -Exercise: no regular exercise -Taking folic acid, vitamin D or calcium: no -Diabetes and Dyslipidemia Screening:labs done recently -Vaccines: see vaccine section EPIC -pap history: 05/06/17 [facility] -FDLMP: see nursing notes -sexual activity: yes, female partner, no new partners -wants STI testing (Hep C if born [birth year]): no -FH breast, colon or ovarian ca: see FH Last mammogram: 05/06/16 Last colon cancer screening: 01/2014 - [facility], 10 years Breast Ca Risk Assessment: see family  history and pt history DEXA (>/= 65):n/a  -Alcohol, Tobacco, drug use: see social history  Review of Systems - no fevers, unintentional weight loss, vision loss, hearing loss, chest pain, sob, hemoptysis, melena, hematochezia, hematuria, genital discharge, changing or concerning skin lesions, bleeding, bruising, loc, thoughts of self harm or SI  Past Medical History:  Diagnosis Date  . Diabetes mellitus   . Elevated cholesterol   . Hypertension     Past Surgical History:  Procedure Laterality Date  . ABDOMINAL HYSTERECTOMY      Family History  Problem Relation Age of Onset  . Diabetes Mother   . Congestive Heart Failure Mother     Social History   Socioeconomic History  . Marital status: Divorced    Spouse name: Not on file  . Number of children: Not on file  . Years of education: Not on file  . Highest education level: Not on file  Occupational History  . Not on file  Social Needs  . Financial resource strain: Not on file  . Food insecurity:    Worry: Not on file    Inability: Not on file  . Transportation needs:    Medical: Not on file    Non-medical: Not on file  Tobacco Use  . Smoking status: Never Smoker  . Smokeless tobacco: Never Used  Substance and Sexual Activity  . Alcohol use: No  . Drug use: No  . Sexual activity: Not on file  Lifestyle  . Physical activity:    Days per week: Not on file    Minutes per session: Not on file  . Stress: Not  on file  Relationships  . Social connections:    Talks on phone: Not on file    Gets together: Not on file    Attends religious service: Not on file    Active member of club or organization: Not on file    Attends meetings of clubs or organizations: Not on file    Relationship status: Not on file  Other Topics Concern  . Not on file  Social History Narrative  . Not on file     Current Outpatient Medications:  .  Alcohol Swabs PADS, Use as directed., Disp: 100 each, Rfl: 12 .  atorvastatin (LIPITOR)  20 MG tablet, Take 1 tablet (20 mg total) by mouth daily., Disp: 90 tablet, Rfl: 3 .  Continuous Blood Gluc Receiver (FREESTYLE LIBRE READER) DEVI, 1 Device by Other route as directed., Disp: 1 Device, Rfl: 3 .  Continuous Blood Gluc Sensor ([place]) MISC, Use as directed to check blood sugar once a day, Disp: 3 each, Rfl: 3 .  Continuous Blood Gluc Sensor ([place]) MISC, Use as directed to check blood sugar once a day, Disp: 3 each, Rfl: 3 .  fluticasone (FLONASE) 50 MCG/ACT nasal spray, Place 2 sprays into both nostrils daily., Disp: 16 g, Rfl: 5 .  glipiZIDE (GLUCOTROL XL) 5 MG 24 hr tablet, Take 1 tablet (5 mg total) by mouth daily with breakfast., Disp: 90 tablet, Rfl: 1 .  glucose blood (TRUETEST TEST) test strip, TEST TWICE WEEKLY., Disp: 100 each, Rfl: 3 .  JANUVIA 100 MG tablet, TAKE 1 TABLET BY MOUTH ONCE DAILY (NEED APPT), Disp: 30 tablet, Rfl: 5 .  lisinopril (PRINIVIL,ZESTRIL) 30 MG tablet, Take 1 tablet (30 mg total) by mouth daily., Disp: 90 tablet, Rfl: 1 .  loratadine (CLARITIN) 10 MG tablet, Take 10 mg by mouth daily., Disp: , Rfl:  .  metFORMIN (GLUCOPHAGE) 1000 MG tablet, TAKE 1 TABLET BY MOUTH TWICE DAILY WITH MEALS., Disp: 180 tablet, Rfl: 1 .  triamterene-hydrochlorothiazide (MAXZIDE-25) 37.5-25 MG tablet, Take 1 tablet by mouth daily., Disp: 90 tablet, Rfl: 0 .  TRUEPLUS LANCETS 30G MISC, USE AS DIRECTED 2 TIMES A WEEK, Disp: 100 each, Rfl: 3  EXAM:  Vitals:   09/27/17 1308  BP: 122/80  Pulse: 90  Temp: 98.7 F (37.1 C)    GENERAL: vitals reviewed and listed below, alert, oriented, appears well hydrated and in no acute distress  HEENT: head atraumatic, PERRLA, normal appearance of eyes, ears, nose and mouth. moist mucus membranes.  NECK: supple, no masses or lymphadenopathy  LUNGS: clear to auscultation bilaterally, no rales, rhonchi or wheeze  CV: HRRR, no peripheral edema or cyanosis, normal pedal pulses  ABDOMEN:  bowel sounds normal, soft, non tender to palpation, no masses, no rebound or guarding  GU/BREAST: Sees GYN, declined  SKIN: no rash or abnormal lesions  MS: normal gait, moves all extremities normally  NEURO: normal gait, speech and thought processing grossly intact, muscle tone grossly intact throughout  PSYCH: normal affect, pleasant and cooperative  ASSESSMENT AND PLAN:  Discussed the following assessment and plan:  PREVENTIVE EXAM: -Discussed and advised all [place] preventive services health task force level A and B recommendations for age, sex and risks. -Advised at least 150 minutes of exercise per week and a healthy diet with avoidance of (less then 1 serving per week) processed foods, white starches, red meat, fast foods and sweets and consisting of: * 5-9 servings of fresh fruits and vegetables (not corn  or potatoes) *nuts and seeds, beans *olives and olive oil *lean meats such as fish and white chicken  *whole grains -labs, studies and vaccines per orders this encounter Advised assistant to obtain a copy of the most recent mammogram and to contact Dr. Ricky Stabs office about colonoscopy recommendations to clarify  Discussed lifestyle changes at length.  Discussed there is options for meals.  Recommended a healthy low sugar, based diet.  Recommended she follow-up in 2 to 3 months.  We will recheck labs then.  Recommended if her diabetes is not at goal at that point, that she see endocrinology for medication management, she refused further medications here.  Discussed the risk of uncontrolled diabetes.  She refused vaccines today.  Patient advised to return to clinic immediately if symptoms worsen or persist or new concerns.  Patient Instructions  BEFORE YOU LEAVE: -Obtain and abstract mammogram report from [place] and abstract colonoscopy recommendations from Dr. Ricky Stabs office -follow up: 2 to 3 months, cancel any other follow-ups scheduled at this  point   We recommend the following healthy lifestyle for LIFE: 1) Small portions. But, make sure to get regular (at least 3 per day), healthy meals and small healthy snacks if needed.  2) Eat a healthy clean diet.   TRY TO EAT: -at least 5-7 servings of low sugar, colorful, and nutrient rich vegetables per day (not corn, potatoes or bananas.) -berries are the best choice if you wish to eat fruit (only eat small amounts if trying to reduce weight)  -lean meets (fish, white meat of chicken or [place]) -vegan proteins for some meals - beans or tofu, whole grains, nuts and seeds -Replace bad fats with good fats - good fats include: fish, nuts and seeds, canola oil, olive oil -small amounts of low fat or non fat dairy -small amounts of100 % whole grains - check the lables -drink plenty of water  AVOID: -SUGAR, sweets, anything with added sugar, corn syrup or sweeteners - must read labels as even foods advertised as "healthy" often are loaded with sugar -if you must have a sweetener, small amounts of stevia may be best -sweetened beverages and artificially sweetened beverages -simple starches (rice, bread, potatoes, pasta, chips, etc - small amounts of 100% whole grains are ok) -red meat, pork, butter -fried foods, fast food, processed food, excessive dairy, eggs and coconut.  3)Get at least 150 minutes of sweaty aerobic exercise per week.  4)Reduce stress - consider counseling, meditation and relaxation to balance other aspects of your life.  Preventive Care 40-64 Years, Female Preventive care refers to lifestyle choices and visits with your health care provider that can promote health and wellness. What does preventive care include?  A yearly physical exam. This is also called an annual well check.  Dental exams once or twice a year.  Routine eye exams. Ask your health care provider how often you should have your eyes checked.  Personal lifestyle choices, including: ? Daily care  of your teeth and gums. ? Regular physical activity. ? Eating a healthy diet. ? Avoiding tobacco and drug use. ? Limiting alcohol use. ? Practicing safe sex. ? Taking vitamin and mineral supplements as recommended by your health care provider. What happens during an annual well check? The services and screenings done by your health care provider during your annual well check will depend on your age, overall health, lifestyle risk factors, and family history of disease. Counseling Your health care provider may ask you questions about your:  Alcohol  use.  Tobacco use.  Drug use.  Emotional well-being.  Home and relationship well-being.  Sexual activity.  Eating habits.  Work and work Statistician.  Method of birth control.  Menstrual cycle.  Pregnancy history.  Screening You may have the following tests or measurements:  Height, weight, and BMI.  Blood pressure.  Lipid and cholesterol levels. These may be checked every 5 years, or more frequently if you are over 88 years old.  Skin check.  Lung cancer screening. You may have this screening every year starting at age 54 if you have a 30-pack-year history of smoking and currently smoke or have quit within the past 15 years.  Fecal occult blood test (FOBT) of the stool. You may have this test every year starting at age 44.  Flexible sigmoidoscopy or colonoscopy. You may have a sigmoidoscopy every 5 years or a colonoscopy every 10 years starting at age 67.  Hepatitis C blood test.  Hepatitis B blood test.  Sexually transmitted disease (STD) testing.  Diabetes screening. This is done by checking your blood sugar (glucose) after you have not eaten for a while (fasting). You may have this done every 1-3 years.  Mammogram. This may be done every 1-2 years. Talk to your health care provider about when you should start having regular mammograms. This may depend on whether you have a family history of breast  cancer.  BRCA-related cancer screening. This may be done if you have a family history of breast, ovarian, tubal, or peritoneal cancers.  Pelvic exam and Pap test. This may be done every 3 years starting at age 31. Starting at age 23, this may be done every 5 years if you have a Pap test in combination with an HPV test.  Bone density scan. This is done to screen for osteoporosis. You may have this scan if you are at high risk for osteoporosis.  Discuss your test results, treatment options, and if necessary, the need for more tests with your health care provider. Vaccines Your health care provider may recommend certain vaccines, such as:  Influenza vaccine. This is recommended every year.  Tetanus, diphtheria, and acellular pertussis (Tdap, Td) vaccine. You may need a Td booster every 10 years.  Varicella vaccine. You may need this if you have not been vaccinated.  Zoster vaccine. You may need this after age 78.  Measles, mumps, and rubella (MMR) vaccine. You may need at least one dose of MMR if you were born in 1957 or later. You may also need a second dose.  Pneumococcal 13-valent conjugate (PCV13) vaccine. You may need this if you have certain conditions and were not previously vaccinated.  Pneumococcal polysaccharide (PPSV23) vaccine. You may need one or two doses if you smoke cigarettes or if you have certain conditions.  Meningococcal vaccine. You may need this if you have certain conditions.  Hepatitis A vaccine. You may need this if you have certain conditions or if you travel or work in places where you may be exposed to hepatitis A.  Hepatitis B vaccine. You may need this if you have certain conditions or if you travel or work in places where you may be exposed to hepatitis B.  Haemophilus influenzae type b (Hib) vaccine. You may need this if you have certain conditions.  Talk to your health care provider about which screenings and vaccines you need and how often you need  them. This information is not intended to replace advice given to you by your health care provider.  Make sure you discuss any questions you have with your health care provider. Document Released: 04/18/2015 Document Revised: 12/10/2015 Document Reviewed: 01/21/2015 Elsevier Interactive Patient Education  2018 [employer].     No follow-ups on file.  Lucretia Kern, DO

## 2017-09-29 DIAGNOSIS — Z86010 Personal history of colonic polyps: Secondary | ICD-10-CM | POA: Diagnosis not present

## 2017-10-17 MED FILL — JANUVIA 100 MG TABLET: 100 | 30 days supply | Qty: 30 | Fill #1

## 2017-10-18 ENCOUNTER — Ambulatory Visit: Payer: 59 | Admitting: Family Medicine

## 2017-11-02 MED FILL — LISINOPRIL 30 MG TABLET: 30 | 90 days supply | Qty: 90 | Fill #1

## 2017-11-02 MED FILL — TRIAMTERENE/HCTZ 37.5/25 TB: 37.5-25 | 30 days supply | Qty: 30 | Fill #0

## 2017-11-07 ENCOUNTER — Encounter: Payer: Self-pay | Admitting: *Deleted

## 2017-11-08 ENCOUNTER — Encounter: Payer: 59 | Admitting: Family Medicine

## 2017-11-08 ENCOUNTER — Ambulatory Visit
Admission: RE | Admit: 2017-11-08 | Discharge: 2017-11-08 | Disposition: A | Discharge status: Home / Self Care | Payer: 59 | Source: Ambulatory Visit | Attending: Internal Medicine | Admitting: Internal Medicine

## 2017-11-08 ENCOUNTER — Encounter: Payer: Self-pay | Admitting: *Deleted

## 2017-11-08 ENCOUNTER — Encounter
Admission: RE | Disposition: A | Discharge status: Home / Self Care | Payer: Self-pay | Source: Ambulatory Visit | Attending: Internal Medicine

## 2017-11-08 ENCOUNTER — Other Ambulatory Visit: Payer: Self-pay

## 2017-11-08 ENCOUNTER — Ambulatory Visit: Payer: 59 | Admitting: Anesthesiology

## 2017-11-08 DIAGNOSIS — Z7984 Long term (current) use of oral hypoglycemic drugs: Secondary | ICD-10-CM | POA: Insufficient documentation

## 2017-11-08 DIAGNOSIS — E119 Type 2 diabetes mellitus without complications: Secondary | ICD-10-CM | POA: Diagnosis not present

## 2017-11-08 DIAGNOSIS — Z86010 Personal history of colonic polyps: Secondary | ICD-10-CM | POA: Diagnosis not present

## 2017-11-08 DIAGNOSIS — D128 Benign neoplasm of rectum: Secondary | ICD-10-CM | POA: Insufficient documentation

## 2017-11-08 DIAGNOSIS — E7800 Pure hypercholesterolemia, unspecified: Secondary | ICD-10-CM | POA: Diagnosis not present

## 2017-11-08 DIAGNOSIS — Z79899 Other long term (current) drug therapy: Secondary | ICD-10-CM | POA: Diagnosis not present

## 2017-11-08 DIAGNOSIS — K635 Polyp of colon: Secondary | ICD-10-CM | POA: Diagnosis not present

## 2017-11-08 DIAGNOSIS — Z1211 Encounter for screening for malignant neoplasm of colon: Secondary | ICD-10-CM | POA: Insufficient documentation

## 2017-11-08 DIAGNOSIS — I10 Essential (primary) hypertension: Secondary | ICD-10-CM | POA: Diagnosis not present

## 2017-11-08 HISTORY — PX: COLONOSCOPY WITH PROPOFOL: SHX5780

## 2017-11-08 HISTORY — DX: Polyp of colon: K63.5

## 2017-11-08 LAB — GLUCOSE, CAPILLARY: Glucose-Capillary: 164 mg/dL — ABNORMAL HIGH (ref 70–99)

## 2017-11-08 LAB — HM COLONOSCOPY

## 2017-11-08 SURGERY — COLONOSCOPY WITH PROPOFOL
Anesthesia: General

## 2017-11-08 MED ORDER — PROPOFOL 500 MG/50ML IV EMUL
INTRAVENOUS | Status: DC | PRN
  Administered 2017-11-08: 100 ug/kg/min via INTRAVENOUS

## 2017-11-08 MED ORDER — MIDAZOLAM HCL 2 MG/2ML IJ SOLN
INTRAMUSCULAR | Status: AC
Start: 2017-11-08 — End: ?
  Filled 2017-11-08: qty 2

## 2017-11-08 MED ORDER — GLYCOPYRROLATE 0.2 MG/ML IJ SOLN
INTRAMUSCULAR | Status: AC
  Filled 2017-11-08: qty 1

## 2017-11-08 MED ORDER — GLYCOPYRROLATE 0.2 MG/ML IJ SOLN
INTRAMUSCULAR | Status: DC | PRN
  Administered 2017-11-08: 0.1 mg via INTRAVENOUS

## 2017-11-08 MED ORDER — LIDOCAINE HCL (PF) 2 % IJ SOLN
INTRAMUSCULAR | Status: AC
  Filled 2017-11-08: qty 10

## 2017-11-08 MED ORDER — PROPOFOL 10 MG/ML IV BOLUS
INTRAVENOUS | Status: DC | PRN
  Administered 2017-11-08: 50 mg via INTRAVENOUS

## 2017-11-08 MED ORDER — LIDOCAINE HCL (CARDIAC) PF 100 MG/5ML IV SOSY
PREFILLED_SYRINGE | INTRAVENOUS | Status: DC | PRN
  Administered 2017-11-08: 40 mg via INTRAVENOUS

## 2017-11-08 MED ORDER — PROPOFOL 500 MG/50ML IV EMUL
INTRAVENOUS | Status: AC
  Filled 2017-11-08: qty 50

## 2017-11-08 MED ORDER — ONDANSETRON HCL 4 MG/2ML IJ SOLN
INTRAMUSCULAR | Status: AC
  Filled 2017-11-08: qty 2

## 2017-11-08 MED ORDER — ONDANSETRON HCL 4 MG/2ML IJ SOLN
INTRAMUSCULAR | Status: DC | PRN
  Administered 2017-11-08: 4 mg via INTRAVENOUS

## 2017-11-08 MED ORDER — MIDAZOLAM HCL 2 MG/2ML IJ SOLN
INTRAMUSCULAR | Status: DC | PRN
  Administered 2017-11-08: 2 mg via INTRAVENOUS

## 2017-11-08 MED ORDER — SODIUM CHLORIDE 0.9 % IV SOLN
INTRAVENOUS | Status: DC
  Administered 2017-11-08: 08:00:00 via INTRAVENOUS

## 2017-11-08 NOTE — H&P (Signed)
Outpatient short stay form Pre-procedure 11/08/2017 8:00 AM Laura Marsh K. Alice Reichert, M.D.  Primary Physician: Colin Benton  Reason for visit:  Personal hx of colon polyps  History of present illness: L 62 year old female presents for a personal history of colon polyps.Patient denies change in bowel habits, rectal bleeding, weight loss or abdominal pain.     Current Facility-Administered Medications:  .  0.9 %  sodium chloride infusion, , Intravenous, Continuous, Laura Marsh, Laura Pike, MD, Last Rate: 20 mL/hr at 11/08/17 6213  Medications Prior to Admission  Medication Sig Dispense Refill Last Dose  . Alcohol Swabs PADS Use as directed. 100 each 12 11/07/2017 at Unknown time  . atorvastatin (LIPITOR) 20 MG tablet Take 1 tablet (20 mg total) by mouth daily. 90 tablet 3 Past Week at Unknown time  . Continuous Blood Gluc Receiver (FREESTYLE LIBRE READER) DEVI 1 Device by Other route as directed. 1 Device 3 11/07/2017 at Unknown time  . Continuous Blood Gluc Sensor ([place]) MISC Use as directed to check blood sugar once a day 3 each 3 11/07/2017 at Unknown time  . Continuous Blood Gluc Sensor ([place]) MISC Use as directed to check blood sugar once a day 3 each 3 11/07/2017 at Unknown time  . fluticasone (FLONASE) 50 MCG/ACT nasal spray Place 2 sprays into both nostrils daily. 16 g 5 Past Week at Unknown time  . glucose blood (TRUETEST TEST) test strip TEST TWICE WEEKLY. 100 each 3 11/07/2017 at Unknown time  . JANUVIA 100 MG tablet TAKE 1 TABLET BY MOUTH ONCE DAILY (NEED APPT) 30 tablet 5 Past Week at Unknown time  . lisinopril (PRINIVIL,ZESTRIL) 30 MG tablet Take 1 tablet (30 mg total) by mouth daily. 90 tablet 1 11/08/2017 at 0600  . loratadine (CLARITIN) 10 MG tablet Take 10 mg by mouth daily.   Past Week at Unknown time  . metFORMIN (GLUCOPHAGE) 1000 MG tablet TAKE 1 TABLET BY MOUTH TWICE DAILY WITH MEALS. 180 tablet 1 Past Week at Unknown time  .  triamterene-hydrochlorothiazide (MAXZIDE-25) 37.5-25 MG tablet Take 1 tablet by mouth daily. 90 tablet 0 Past Week at Unknown time  . TRUEPLUS LANCETS 30G MISC USE AS DIRECTED 2 TIMES A WEEK 100 each 3 Past Week at Unknown time  . glipiZIDE (GLUCOTROL XL) 5 MG 24 hr tablet Take 1 tablet (5 mg total) by mouth daily with breakfast. (Patient not taking: Reported on 11/08/2017) 90 tablet 1 Not Taking at Unknown time     No Known Allergies   Past Medical History:  Diagnosis Date  . Colon polyp   . Diabetes mellitus   . Elevated cholesterol   . Hypertension     Review of systems:  Otherwise negative.    Physical Exam  Gen: Alert, oriented. Appears stated age.  HEENT: /AT. PERRLA. Lungs: CTA, no wheezes. CV: RR nl S1, S2. Abd: soft, benign, no masses. BS+ Ext: No edema. Pulses 2+    Planned procedures: Proceed with colonoscopy. The patient understands the nature of the planned procedure, indications, risks, alternatives and potential complications including but not limited to bleeding, infection, perforation, damage to internal organs and possible oversedation/side effects from anesthesia. The patient agrees and gives consent to proceed.  Please refer to procedure notes for findings, recommendations and patient disposition/instructions.     [name] K. Alice Reichert, M.D. Gastroenterology 11/08/2017  8:00 AM

## 2017-11-08 NOTE — Anesthesia Post-op Follow-up Note (Signed)
Anesthesia QCDR form completed.        

## 2017-11-08 NOTE — Anesthesia Postprocedure Evaluation (Signed)
Anesthesia Post Note  Patient: Laura Marsh  Procedure(s) Performed: COLONOSCOPY WITH PROPOFOL (N/A )  Patient location during evaluation: Endoscopy Anesthesia Type: General Level of consciousness: awake and alert, oriented and patient cooperative Pain management: satisfactory to patient Vital Signs Assessment: post-procedure vital signs reviewed and stable Respiratory status: spontaneous breathing and respiratory function stable Cardiovascular status: blood pressure returned to baseline and stable Postop Assessment: no headache, no backache, patient able to bend at knees, no apparent nausea or vomiting, adequate PO intake and able to ambulate Anesthetic complications: no     Last Vitals:  Vitals:   11/08/17 0714 11/08/17 0835  BP: 133/89 105/62  Pulse: 82 87  Resp: 18 18  Temp: (!) 35.5 C (!) 36.3 C  SpO2: 97% 96%    Last Pain:  Vitals:   11/08/17 0835  TempSrc: Tympanic  PainSc: 0-No pain                 Sha Burling H Kambrie Eddleman

## 2017-11-08 NOTE — Transfer of Care (Signed)
Immediate Anesthesia Transfer of Care Note  Patient: Laura Marsh  Procedure(s) Performed: COLONOSCOPY WITH PROPOFOL (N/A )  Patient Location: PACU and Endoscopy Unit  Anesthesia Type:General  Level of Consciousness: drowsy and patient cooperative  Airway & Oxygen Therapy: Patient Spontanous Breathing  Post-op Assessment: Report given to RN, Post -op Vital signs reviewed and stable and Patient moving all extremities  Post vital signs: Reviewed and stable  Last Vitals:  Vitals Value Taken Time  BP 105/62 11/08/2017  8:35 AM  Temp 36.3 C 11/08/2017  8:35 AM  Pulse 86 11/08/2017  8:35 AM  Resp 17 11/08/2017  8:35 AM  SpO2 96 % 11/08/2017  8:35 AM  Vitals shown include unvalidated device data.  Last Pain:  Vitals:   11/08/17 0835  TempSrc: Tympanic  PainSc: 0-No pain         Complications: No apparent anesthesia complications

## 2017-11-08 NOTE — Anesthesia Preprocedure Evaluation (Signed)
Anesthesia Evaluation  Patient identified by MRN, date of birth, ID band Patient awake    Reviewed: Allergy & Precautions, H&P , NPO status , Patient's Chart, lab work & pertinent test results, reviewed documented beta blocker date and time   History of Anesthesia Complications Negative for: history of anesthetic complications  Airway Mallampati: II  TM Distance: >3 FB Neck ROM: full    Dental  (+) Caps, Dental Advidsory Given, Missing, Teeth Intact   Pulmonary neg pulmonary ROS,           Cardiovascular Exercise Tolerance: Good hypertension, (-) angina(-) CAD, (-) Past MI, (-) Cardiac Stents and (-) CABG (-) dysrhythmias (-) Valvular Problems/Murmurs     Neuro/Psych negative neurological ROS  negative psych ROS   GI/Hepatic negative GI ROS, Neg liver ROS,   Endo/Other  diabetes  Renal/GU negative Renal ROS  negative genitourinary   Musculoskeletal   Abdominal   Peds  Hematology negative hematology ROS (+)   Anesthesia Other Findings Past Medical History: No date: Colon polyp No date: Diabetes mellitus No date: Elevated cholesterol No date: Hypertension   Reproductive/Obstetrics negative OB ROS                             Anesthesia Physical Anesthesia Plan  ASA: II  Anesthesia Plan: General   Post-op Pain Management:    Induction: Intravenous  PONV Risk Score and Plan: 3 and Propofol infusion and TIVA  Airway Management Planned: Nasal Cannula  Additional Equipment:   Intra-op Plan:   Post-operative Plan:   Informed Consent: I have reviewed the patients History and Physical, chart, labs and discussed the procedure including the risks, benefits and alternatives for the proposed anesthesia with the patient or authorized representative who has indicated his/her understanding and acceptance.   Dental Advisory Given  Plan Discussed with: Anesthesiologist, CRNA and  Surgeon  Anesthesia Plan Comments:         Anesthesia Quick Evaluation

## 2017-11-08 NOTE — Op Note (Addendum)
[facility] Gastroenterology Patient Name: Laura Marsh Procedure Date: 11/08/2017 7:34 AM MRN: [id number] Account #: [id number] Date of Birth: [birth year] Admit Type: Outpatient Age: 62 Room: [facility] ENDO ROOM 2 Gender: Female Note Status: Finalized Procedure:            Colonoscopy Indications:          Surveillance: Personal history of adenomatous polyps on                        last colonoscopy > 3 years ago Providers:            Lorie Apley K. Alice Reichert MD, MD Referring MD:         Lucretia Kern MD, MD (Referring MD) Medicines:            Propofol per Anesthesia Complications:        No immediate complications. Procedure:            Pre-Anesthesia Assessment:                       - The risks and benefits of the procedure and the                        sedation options and risks were discussed with the                        patient. All questions were answered and informed                        consent was obtained.                       - Patient identification and proposed procedure were                        verified prior to the procedure by the nurse. The                        procedure was verified in the procedure room.                       - ASA Grade Assessment: III - A patient with severe                        systemic disease.                       - After reviewing the risks and benefits, the patient                        was deemed in satisfactory condition to undergo the                        procedure.                       After obtaining informed consent, the colonoscope was                        passed under direct vision. Throughout the procedure,  the patient's blood pressure, pulse, and oxygen                        saturations were monitored continuously. The                        Colonoscope was introduced through the anus and                        advanced to the the cecum, identified by appendiceal                orifice and ileocecal valve. The colonoscopy was                        performed without difficulty. The patient tolerated the                        procedure well. The quality of the bowel preparation                        was good. The ileocecal valve, appendiceal orifice, and                        rectum were photographed. Findings:      The perianal and digital rectal examinations were normal. Pertinent       negatives include normal sphincter tone and no palpable rectal lesions.      A 30 mm polyp was found in the rectum. The polyp was carpet-like. The       polyp was removed with a hot snare at 20 watts. Resection and retrieval       were complete using a biopsy forceps. One hemostatic clip was       successfully placed. Area was successfully injected with 7 mL Eleview       for a lift polypectomy. Estimated blood loss was minimal. APC applied to       post polyp area for hemostasis which was controlled at end of procedure      The exam was otherwise without abnormality on direct and retroflexion       views. Impression:           - Large, carpet-like polyp in the rectum near area of a                        remote polypectomy. Polyp removed in piecemeal fashion                        as described above.                       The examination was otherwise normal on direct and                        retroflexion views.                       - No specimens collected. Recommendation:       - Patient has a contact number available for                        emergencies. The signs and symptoms of potential  delayed complications were discussed with the patient.                        Return to normal activities tomorrow. Written discharge                        instructions were provided to the patient.                       - Resume previous diet.                       - Continue present medications.                       - Repeat colonoscopy is  recommended for surveillance. I                        recommend repeating in one year pending pathology                        review to assure complete excision of the polyp.                       - Return to GI office PRN.                       - The findings and recommendations were discussed with                        the patient and their family. Procedure Code(s):    --- Professional ---                       [phone], Colonoscopy, flexible; with removal of tumor(s),                        polyp(s), or other lesion(s) by snare technique                       [id number], Colonoscopy, flexible; with directed submucosal                        injection(s), any substance Diagnosis Code(s):    --- Professional ---                       Z86.010, Personal history of colonic polyps CPT copyright 2017 American Medical Association. All rights reserved. The codes documented in this report are preliminary and upon coder review may  be revised to meet current compliance requirements. Efrain Sella MD, MD 11/08/2017 8:39:48 AM This report has been signed electronically. Number of Addenda: 0 Note Initiated On: 11/08/2017 7:34 AM Scope Withdrawal Time: 0 hours 18 minutes 4 seconds  Total Procedure Duration: 0 hours 24 minutes 8 seconds       [facility]

## 2017-11-08 NOTE — Interval H&P Note (Signed)
History and Physical Interval Note:  11/08/2017 8:01 AM  Laura Marsh  has presented today for surgery, with the diagnosis of HX.OF COLON POLYPS  The various methods of treatment have been discussed with the patient and family. After consideration of risks, benefits and other options for treatment, the patient has consented to  Procedure(s): COLONOSCOPY WITH PROPOFOL (N/A) as a surgical intervention .  The patient's history has been reviewed, patient examined, no change in status, stable for surgery.  I have reviewed the patient's chart and labs.  Questions were answered to the patient's satisfaction.     [place], [place]

## 2017-11-09 ENCOUNTER — Encounter: Payer: Self-pay | Admitting: Internal Medicine

## 2017-11-10 LAB — SURGICAL PATHOLOGY

## 2017-11-22 ENCOUNTER — Ambulatory Visit: Payer: 59 | Admitting: Family Medicine

## 2017-11-22 MED FILL — JANUVIA 100 MG TABLET: 100 | 30 days supply | Qty: 30 | Fill #2

## 2017-11-24 ENCOUNTER — Encounter: Payer: Self-pay | Admitting: Family Medicine

## 2017-12-16 ENCOUNTER — Other Ambulatory Visit: Payer: Self-pay | Admitting: Family Medicine

## 2017-12-16 MED FILL — JANUVIA 100 MG TABLET: 100 | 30 days supply | Qty: 30 | Fill #3

## 2017-12-16 MED FILL — TRIAMTERENE/HCTZ 37.5/25 TB: 37.5-25 | 30 days supply | Qty: 30 | Fill #0

## 2017-12-16 MED FILL — ATORVASTATIN CALCIUM 20 MG: 20 | 90 days supply | Qty: 90 | Fill #1

## 2017-12-29 MED FILL — metFORMIN HCL 1000 MG TABS: 1000 | 90 days supply | Qty: 180 | Fill #1

## 2018-01-03 ENCOUNTER — Ambulatory Visit: Payer: 59 | Admitting: Family Medicine

## 2018-01-20 MED FILL — JANUVIA 100 MG TABLET: 100 | 30 days supply | Qty: 30 | Fill #4

## 2018-01-20 MED FILL — TRIAMTERENE/HCTZ 37.5/25 TB: 37.5-25 | 30 days supply | Qty: 30 | Fill #1

## 2018-01-26 ENCOUNTER — Ambulatory Visit: Payer: 59 | Admitting: Family Medicine

## 2018-02-06 ENCOUNTER — Other Ambulatory Visit: Payer: Self-pay | Admitting: Family Medicine

## 2018-02-06 MED FILL — FLUTICASONE PROP 50 MCG SPR: 50 | 30 days supply | Qty: 16 | Fill #0

## 2018-02-06 MED FILL — LISINOPRIL 30 MG TABLET: 30 | 90 days supply | Qty: 90 | Fill #0

## 2018-02-16 ENCOUNTER — Ambulatory Visit: Payer: 59 | Admitting: Family Medicine

## 2018-02-24 ENCOUNTER — Ambulatory Visit: Payer: 59 | Admitting: Family Medicine

## 2018-02-24 ENCOUNTER — Encounter: Payer: Self-pay | Admitting: Family Medicine

## 2018-02-24 VITALS — BP 136/98 | HR 97 | Temp 98.2°F | Ht 63.0 in | Wt 200.0 lb

## 2018-02-24 DIAGNOSIS — R05 Cough: Secondary | ICD-10-CM

## 2018-02-24 DIAGNOSIS — R059 Cough, unspecified: Secondary | ICD-10-CM

## 2018-02-24 MED ORDER — AMOXICILLIN-POT CLAVULANATE 875-125 MG PO TABS: 1.0000 | ORAL_TABLET | Freq: Two times a day (BID) | ORAL | 0 refills | Status: DC

## 2018-02-24 MED ORDER — BENZONATATE 100 MG PO CAPS: 100.0000 mg | ORAL_CAPSULE | Freq: Three times a day (TID) | ORAL | 0 refills | Status: DC | PRN

## 2018-02-24 MED FILL — AMOX-CLAV 875-125 MG TABLET: 875-125 | 7 days supply | Qty: 14 | Fill #0

## 2018-02-24 MED FILL — BENZONATATE 100 MG CAP: 100 | 10 days supply | Qty: 30 | Fill #0

## 2018-02-24 MED FILL — TRIAMTERENE/HCTZ 37.5/25 TB: 37.5-25 | 30 days supply | Qty: 30 | Fill #2

## 2018-02-24 MED FILL — JANUVIA 100 MG TABLET: 100 | 30 days supply | Qty: 30 | Fill #5

## 2018-02-24 NOTE — Patient Instructions (Signed)
Follow up for any fever or increased shortness of breath. 

## 2018-02-24 NOTE — Progress Notes (Signed)
  Subjective:     Patient ID: Laura Marsh, female   DOB: 03-11-1956, 62 y.o.   MRN: [id number]  HPI Patient is seen with cough productive of green sputum for the past week or so.  Non-smoker.  No fever.  No chills.  She states she has had some occasional "wheezes "at night.  No history of asthma.  She has type 2 diabetes which has been poorly controlled.  She has follow-up with primary in December to discuss  Past Medical History:  Diagnosis Date  . Colon polyp   . Diabetes mellitus   . Elevated cholesterol   . Hypertension    Past Surgical History:  Procedure Laterality Date  . ABDOMINAL HYSTERECTOMY    . COLONOSCOPY    . COLONOSCOPY WITH PROPOFOL N/A 11/08/2017   Procedure: COLONOSCOPY WITH PROPOFOL;  Surgeon: Toledo, Benay Pike, MD;  Location: [facility] ENDOSCOPY;  Service: Gastroenterology;  Laterality: N/A;    reports that she has never smoked. She has never used smokeless tobacco. She reports that she drinks alcohol. She reports that she does not use drugs. family history includes Congestive Heart Failure in her mother; Diabetes in her mother. No Known Allergies   Review of Systems  Constitutional: Negative for chills and fever.  HENT: Negative for sinus pain and sore throat.   Respiratory: Positive for cough and wheezing. Negative for shortness of breath.   Cardiovascular: Negative for chest pain.       Objective:   Physical Exam  Constitutional: She appears well-developed and well-nourished.  HENT:  Right Ear: External ear normal.  Left Ear: External ear normal.  Mouth/Throat: Oropharynx is clear and moist.  Neck: Neck supple.  Cardiovascular: Normal rate and regular rhythm.  Pulmonary/Chest: Effort normal and breath sounds normal. She has no wheezes. She has no rales.  Lymphadenopathy:    She has no cervical adenopathy.       Assessment:     Cough.  Suspect acute viral bronchitis.  No respiratory distress    Plan:     -Refill Tessalon Perles 100 mg every  8 hours as needed for cough -Stay well-hydrated -We did not recommend antibiotics at this point but did print prescription for Augmentin 875 mg twice daily if she has any fever or worsening symptoms over the weekend -Follow-up immediately for any fever, increased shortness of breath, or other concerns  Eulas Post MD [facility]

## 2018-03-15 NOTE — Progress Notes (Signed)
HPI:  Using dictation device. Unfortunately this device frequently misinterprets words/phrases. Due for pneumococal, flu vaccines, foot exam, labs  Vaida D Cardamone is a pleasant 62 y.o. here for follow up. Chronic medical problems summarized below were reviewed for changes and stability and were updated as needed below. These issues and their treatment remain stable for the most part. No exercise. Diet with lots of starches. Denies CP, SOB, DOE, treatment intolerance or new symptoms.  Diabetes -with hld, htn -hx poor compliance, poor diet and sedentary lifestyle -meds: glipizide, januvia, metformin  Hyperlipidemia/Obesity: -meds: atorvastatin  HTN: -meds: lisinpril, triam-hctz from prior pcp   ROS: See pertinent positives and negatives per HPI.  Past Medical History:  Diagnosis Date  . Colon polyp   . Diabetes mellitus   . Elevated cholesterol   . Hypertension     Past Surgical History:  Procedure Laterality Date  . ABDOMINAL HYSTERECTOMY    . COLONOSCOPY    . COLONOSCOPY WITH PROPOFOL N/A 11/08/2017   Procedure: COLONOSCOPY WITH PROPOFOL;  Surgeon: Toledo, Benay Pike, MD;  Location: [facility] ENDOSCOPY;  Service: Gastroenterology;  Laterality: N/A;    Family History  Problem Relation Age of Onset  . Diabetes Mother   . Congestive Heart Failure Mother     SOCIAL HX: see hpi   Current Outpatient Medications:  .  Alcohol Swabs PADS, Use as directed., Disp: 100 each, Rfl: 12 .  atorvastatin (LIPITOR) 20 MG tablet, Take 1 tablet (20 mg total) by mouth daily., Disp: 90 tablet, Rfl: 3 .  fluticasone (FLONASE) 50 MCG/ACT nasal spray, Place 2 sprays into both nostrils daily., Disp: 16 g, Rfl: 5 .  glipiZIDE (GLUCOTROL XL) 5 MG 24 hr tablet, Take 1 tablet (5 mg total) by mouth daily with breakfast., Disp: 90 tablet, Rfl: 1 .  glucose blood (TRUETEST TEST) test strip, TEST TWICE WEEKLY., Disp: 100 each, Rfl: 3 .  JANUVIA 100 MG tablet, TAKE 1 TABLET BY MOUTH ONCE DAILY (NEED  APPT), Disp: 30 tablet, Rfl: 5 .  lisinopril (PRINIVIL,ZESTRIL) 30 MG tablet, TAKE 1 TABLET BY MOUTH ONCE DAILY, Disp: 90 tablet, Rfl: 1 .  loratadine (CLARITIN) 10 MG tablet, Take 10 mg by mouth daily., Disp: , Rfl:  .  metFORMIN (GLUCOPHAGE) 1000 MG tablet, TAKE 1 TABLET BY MOUTH TWICE DAILY WITH MEALS., Disp: 180 tablet, Rfl: 1 .  triamterene-hydrochlorothiazide (MAXZIDE-25) 37.5-25 MG tablet, Take 1 tablet by mouth daily., Disp: 90 tablet, Rfl: 0 .  TRUEPLUS LANCETS 30G MISC, USE AS DIRECTED 2 TIMES A WEEK, Disp: 100 each, Rfl: 3 .  amLODipine (NORVASC) 5 MG tablet, Take 1 tablet (5 mg total) by mouth daily., Disp: 90 tablet, Rfl: 3  EXAM:  Vitals:   03/16/18 0711  BP: 138/88  Pulse: 98  Temp: 97.9 F (36.6 C)  SpO2: 97%    Body mass index is 35.43 kg/m.  GENERAL: vitals reviewed and listed above, alert, oriented, appears well hydrated and in no acute distress  HEENT: atraumatic, conjunttiva clear, no obvious abnormalities on inspection of external nose and ears  NECK: no obvious masses on inspection  LUNGS: clear to auscultation bilaterally, no wheezes, rales or rhonchi, good air movement  CV: HRRR, no peripheral edema  MS: moves all extremities without noticeable abnormality  PSYCH: pleasant and cooperative, no obvious depression or anxiety  ASSESSMENT AND PLAN:  Discussed the following assessment and plan:  Type 2 diabetes mellitus with other specified complication, without long-term current use of insulin ([place]) - Plan: Hemoglobin A1c  Hyperlipidemia  associated with type 2 diabetes mellitus ([place]) - Plan: Lipid panel  Hypertension associated with diabetes ([place]) - Plan: Basic metabolic panel, CBC  Morbid obesity ([place])  -discussed tx options and risks for BP - opted to add norvasc -labs per orders -lifestyle recs per pt instructions discussed at length -foot exam done -she decline the pneumonia vaccine -follow up 1-2 month BP -Patient advised to return or  notify a doctor immediately if symptoms worsen or persist or new concerns arise.  Patient Instructions  BEFORE YOU LEAVE: -labs -follow up: 1-2 months  START the Norvasc and take once daily in the morning with the rest of your blood pressure medications  EAT a healthy low sugar diet such as the [place] a minimum of 150 minutes of aerobic exercise per week  We have ordered labs or studies at this visit. It can take up to 1-2 weeks for results and processing. IF results require follow up or explanation, we will call you with instructions. Clinically stable results will be released to your [facility]. If you have not heard from [place] or cannot find your results in [facility] in 2 weeks please contact our office at [phone].  If you are not yet signed up for [facility], please consider signing up.   We recommend the following healthy lifestyle for LIFE: 1) Small portions. But, make sure to get regular (at least 3 per day), healthy meals and small healthy snacks if needed.  2) Eat a healthy clean diet.   TRY TO EAT: -at least 5-7 servings of low sugar, colorful, and nutrient rich vegetables per day (not corn, potatoes or bananas.) -berries are the best choice if you wish to eat fruit (only eat small amounts if trying to reduce weight)  -lean meets (fish, white meat of chicken or [place]) -vegan proteins for some meals - beans or tofu, whole grains, nuts and seeds -Replace bad fats with good fats - good fats include: fish, nuts and seeds, canola oil, olive oil -small amounts of low fat or non fat dairy -small amounts of100 % whole grains - check the lables -drink plenty of water  AVOID: -SUGAR, sweets, anything with added sugar, corn syrup or sweeteners - must read labels as even foods advertised as "healthy" often are loaded with sugar -if you must have a sweetener, small amounts of stevia may be best -sweetened beverages and artificially sweetened beverages -simple starches  (rice, bread, potatoes, pasta, chips, etc - small amounts of 100% whole grains are ok) -red meat, pork, butter -fried foods, fast food, processed food, excessive dairy, eggs and coconut.  3)Get at least 150 minutes of sweaty aerobic exercise per week.  4)Reduce stress - consider counseling, meditation and relaxation to balance other aspects of your life.         Lucretia Kern, DO

## 2018-03-16 ENCOUNTER — Ambulatory Visit: Payer: 59 | Admitting: Family Medicine

## 2018-03-16 ENCOUNTER — Encounter: Payer: Self-pay | Admitting: Family Medicine

## 2018-03-16 VITALS — BP 138/88 | HR 98 | Temp 97.9°F | Ht 63.0 in

## 2018-03-16 DIAGNOSIS — I152 Hypertension secondary to endocrine disorders: Secondary | ICD-10-CM

## 2018-03-16 DIAGNOSIS — E785 Hyperlipidemia, unspecified: Secondary | ICD-10-CM

## 2018-03-16 DIAGNOSIS — I10 Essential (primary) hypertension: Secondary | ICD-10-CM

## 2018-03-16 DIAGNOSIS — E1169 Type 2 diabetes mellitus with other specified complication: Secondary | ICD-10-CM

## 2018-03-16 DIAGNOSIS — E1159 Type 2 diabetes mellitus with other circulatory complications: Secondary | ICD-10-CM | POA: Diagnosis not present

## 2018-03-16 LAB — CBC
HCT: 37.7 % (ref 36.0–46.0)
Hemoglobin: 12.2 g/dL (ref 12.0–15.0)
MCHC: 32.4 g/dL (ref 30.0–36.0)
MCV: 81.4 fl (ref 78.0–100.0)
Platelets: 311 10*3/uL (ref 150.0–400.0)
RBC: 4.63 Mil/uL (ref 3.87–5.11)
RDW: 13.7 % (ref 11.5–15.5)
WBC: 7 10*3/uL (ref 4.0–10.5)

## 2018-03-16 LAB — BASIC METABOLIC PANEL
BUN: 12 mg/dL (ref 6–23)
CHLORIDE: 100 meq/L (ref 96–112)
CO2: 30 mEq/L (ref 19–32)
CREATININE: 0.68 mg/dL (ref 0.40–1.20)
Calcium: 9.7 mg/dL (ref 8.4–10.5)
GFR: 113.41 mL/min (ref >60.00)
Glucose, Bld: 219 mg/dL — ABNORMAL HIGH (ref 70–99)
POTASSIUM: 3.7 meq/L (ref 3.5–5.1)
Sodium: 137 mEq/L (ref 135–145)

## 2018-03-16 LAB — HEMOGLOBIN A1C: Hgb A1c MFr Bld: 8.5 % — ABNORMAL HIGH (ref 4.6–6.5)

## 2018-03-16 LAB — LIPID PANEL
CHOL/HDL RATIO: 3
CHOLESTEROL: 144 mg/dL (ref 0–200)
HDL: 48.4 mg/dL (ref >39.00)
LDL CALC: 83 mg/dL (ref 0–99)
NonHDL: 95.75
Triglycerides: 63 mg/dL (ref 0.0–149.0)
VLDL: 12.6 mg/dL (ref 0.0–40.0)

## 2018-03-16 MED ORDER — AMLODIPINE BESYLATE 5 MG PO TABS: 5.0000 mg | ORAL_TABLET | Freq: Every day | ORAL | 3 refills | Status: DC

## 2018-03-16 MED FILL — AMLODIPINE BESYLATE 5 MG TA: 5 | 90 days supply | Qty: 90 | Fill #0

## 2018-03-16 NOTE — Patient Instructions (Signed)
BEFORE YOU LEAVE: -labs -follow up: 1-2 months  START the Norvasc and take once daily in the morning with the rest of your blood pressure medications  EAT a healthy low sugar diet such as the [place] diet  Get a minimum of 150 minutes of aerobic exercise per week  We have ordered labs or studies at this visit. It can take up to 1-2 weeks for results and processing. IF results require follow up or explanation, we will call you with instructions. Clinically stable results will be released to your [facility]. If you have not heard from [place] or cannot find your results in [facility] in 2 weeks please contact our office at [phone].  If you are not yet signed up for [facility], please consider signing up.   We recommend the following healthy lifestyle for LIFE: 1) Small portions. But, make sure to get regular (at least 3 per day), healthy meals and small healthy snacks if needed.  2) Eat a healthy clean diet.   TRY TO EAT: -at least 5-7 servings of low sugar, colorful, and nutrient rich vegetables per day (not corn, potatoes or bananas.) -berries are the best choice if you wish to eat fruit (only eat small amounts if trying to reduce weight)  -lean meets (fish, white meat of chicken or [place]) -vegan proteins for some meals - beans or tofu, whole grains, nuts and seeds -Replace bad fats with good fats - good fats include: fish, nuts and seeds, canola oil, olive oil -small amounts of low fat or non fat dairy -small amounts of100 % whole grains - check the lables -drink plenty of water  AVOID: -SUGAR, sweets, anything with added sugar, corn syrup or sweeteners - must read labels as even foods advertised as "healthy" often are loaded with sugar -if you must have a sweetener, small amounts of stevia may be best -sweetened beverages and artificially sweetened beverages -simple starches (rice, bread, potatoes, pasta, chips, etc - small amounts of 100% whole grains are ok) -red meat, pork,  butter -fried foods, fast food, processed food, excessive dairy, eggs and coconut.  3)Get at least 150 minutes of sweaty aerobic exercise per week.  4)Reduce stress - consider counseling, meditation and relaxation to balance other aspects of your life.

## 2018-03-28 ENCOUNTER — Telehealth: Payer: Self-pay | Admitting: Family Medicine

## 2018-03-28 ENCOUNTER — Other Ambulatory Visit: Payer: Self-pay | Admitting: Family Medicine

## 2018-03-28 MED FILL — TRIAMTERENE/HCTZ 37.5/25 TB: 37.5-25 | 30 days supply | Qty: 30 | Fill #3

## 2018-03-28 NOTE — Telephone Encounter (Signed)
Pt given lab results per notes of Dr Maudie Mercury on 03/16/18. Pt verbalized understanding.  Follow up appointment scheduled per orders.

## 2018-03-30 MED FILL — JANUVIA 100 MG TABLET: 100 | 30 days supply | Qty: 30 | Fill #0

## 2018-03-30 MED FILL — metFORMIN HCL 1000 MG TABS: 1000 | 90 days supply | Qty: 180 | Fill #0

## 2018-04-21 MED FILL — ATORVASTATIN CALCIUM 20 MG: 20 | 90 days supply | Qty: 90 | Fill #2

## 2018-04-26 ENCOUNTER — Telehealth: Payer: Self-pay | Admitting: *Deleted

## 2018-04-26 NOTE — Telephone Encounter (Signed)
Copied from [place] [phone]. Topic: General - Other >> Apr 21, 2018  5:01 PM Windy Kalata wrote: Reason for CRM: Patient received an Accucheck machine from the [facility]ness program but they did not give her any test strips or needles, please advise on how she is to get these?  Best call back is [phone]

## 2018-04-27 MED FILL — JANUVIA 100 MG TABLET: 100 | 30 days supply | Qty: 30 | Fill #1

## 2018-04-27 MED FILL — LISINOPRIL 30 MG TABLET: 30 | 90 days supply | Qty: 90 | Fill #1

## 2018-05-02 ENCOUNTER — Ambulatory Visit: Payer: 59 | Admitting: Family Medicine

## 2018-05-06 NOTE — Progress Notes (Deleted)
HPI:  Using dictation device. Unfortunately this device frequently misinterprets words/phrases.  Laura Marsh is a pleasant 63 y.o. here for follow up. Chronic medical problems summarized below were reviewed for changes. Hgba1c improved significantly from 11 --> 8.5 at recent check. ***. Denies CP, SOB, DOE, treatment intolerance or new symptoms. Due for insulin, mammo, ob f/u Diabetes, uncontrolled, complicated: -DPOE4M from 11 (4/19) --> 8.5 (12/19) -with hld, htn -hx poor compliance, poor diet and sedentary lifestyle -meds: glipizide xl 5mg , januvia 100mg , metformin 1000mg  bid  Hyperlipidemia/Morbid Obesity: -meds: atorvastatin -BMI >35 with DM, HLD, HTN  HTN: -meds: amlodipine, lisinpril, triam-hctz from prior pcp  ROS: See pertinent positives and negatives per HPI.  Past Medical History:  Diagnosis Date  . Colon polyp   . Diabetes mellitus   . Elevated cholesterol   . Hypertension     Past Surgical History:  Procedure Laterality Date  . ABDOMINAL HYSTERECTOMY    . COLONOSCOPY    . COLONOSCOPY WITH PROPOFOL N/A 11/08/2017   Procedure: COLONOSCOPY WITH PROPOFOL;  Surgeon: Toledo, Benay Pike, MD;  Location: [facility] ENDOSCOPY;  Service: Gastroenterology;  Laterality: N/A;    Family History  Problem Relation Age of Onset  . Diabetes Mother   . Congestive Heart Failure Mother     SOCIAL HX: ***   Current Outpatient Medications:  .  Alcohol Swabs PADS, Use as directed., Disp: 100 each, Rfl: 12 .  amLODipine (NORVASC) 5 MG tablet, Take 1 tablet (5 mg total) by mouth daily., Disp: 90 tablet, Rfl: 3 .  atorvastatin (LIPITOR) 20 MG tablet, Take 1 tablet (20 mg total) by mouth daily., Disp: 90 tablet, Rfl: 3 .  fluticasone (FLONASE) 50 MCG/ACT nasal spray, Place 2 sprays into both nostrils daily., Disp: 16 g, Rfl: 5 .  glipiZIDE (GLUCOTROL XL) 5 MG 24 hr tablet, Take 1 tablet (5 mg total) by mouth daily with breakfast., Disp: 90 tablet, Rfl: 1 .  glucose blood  (TRUETEST TEST) test strip, TEST TWICE WEEKLY., Disp: 100 each, Rfl: 3 .  JANUVIA 100 MG tablet, TAKE 1 TABLET BY MOUTH ONCE DAILY. PATIENT NEEDS AN APPOINTMENT., Disp: 30 tablet, Rfl: 5 .  lisinopril (PRINIVIL,ZESTRIL) 30 MG tablet, TAKE 1 TABLET BY MOUTH ONCE DAILY, Disp: 90 tablet, Rfl: 1 .  loratadine (CLARITIN) 10 MG tablet, Take 10 mg by mouth daily., Disp: , Rfl:  .  metFORMIN (GLUCOPHAGE) 1000 MG tablet, TAKE 1 TABLET BY MOUTH TWICE DAILY WITH MEALS., Disp: 180 tablet, Rfl: 1 .  triamterene-hydrochlorothiazide (MAXZIDE-25) 37.5-25 MG tablet, Take 1 tablet by mouth daily., Disp: 90 tablet, Rfl: 0 .  TRUEPLUS LANCETS 30G MISC, USE AS DIRECTED 2 TIMES A WEEK, Disp: 100 each, Rfl: 3  EXAM:  There were no vitals filed for this visit.  There is no height or weight on file to calculate BMI.  GENERAL: vitals reviewed and listed above, alert, oriented, appears well hydrated and in no acute distress  HEENT: atraumatic, conjunttiva clear, no obvious abnormalities on inspection of external nose and ears  NECK: no obvious masses on inspection  LUNGS: clear to auscultation bilaterally, no wheezes, rales or rhonchi, good air movement  CV: HRRR, no peripheral edema  MS: moves all extremities without noticeable abnormality *** PSYCH: pleasant and cooperative, no obvious depression or anxiety  ASSESSMENT AND PLAN:  Discussed the following assessment and plan:  No diagnosis found.  *** -Patient advised to return or notify a doctor immediately if symptoms worsen or persist or new concerns arise.  There are  no Patient Instructions on file for this visit.  Lucretia Kern, DO

## 2018-05-09 ENCOUNTER — Encounter: Payer: Self-pay | Admitting: Family Medicine

## 2018-05-09 ENCOUNTER — Ambulatory Visit: Payer: 59 | Admitting: Family Medicine

## 2018-05-09 VITALS — BP 116/72 | HR 107 | Temp 98.2°F | Ht 63.0 in

## 2018-05-09 DIAGNOSIS — E1169 Type 2 diabetes mellitus with other specified complication: Secondary | ICD-10-CM

## 2018-05-09 DIAGNOSIS — I10 Essential (primary) hypertension: Secondary | ICD-10-CM

## 2018-05-09 DIAGNOSIS — E785 Hyperlipidemia, unspecified: Secondary | ICD-10-CM

## 2018-05-09 DIAGNOSIS — E1159 Type 2 diabetes mellitus with other circulatory complications: Secondary | ICD-10-CM | POA: Diagnosis not present

## 2018-05-09 DIAGNOSIS — I152 Hypertension secondary to endocrine disorders: Secondary | ICD-10-CM

## 2018-05-09 MED FILL — TRIAMTERENE/HCTZ 37.5/25 TB: 37.5-25 | 30 days supply | Qty: 30 | Fill #4

## 2018-05-09 NOTE — Patient Instructions (Addendum)
BEFORE YOU LEAVE: -order mammo -follow up: 3-4 months  Continue current medications and work on a healthy low sugar diet and regular exercise.  -We placed a referral for you as discussed to the endocrinologist. It usually takes about 1-2 weeks to process and schedule this referral. If you have not heard from [place] regarding this appointment in 2 weeks please contact our office.  Hang in there. Praying for your family.

## 2018-05-09 NOTE — Progress Notes (Signed)
HPI:  Using dictation device. Unfortunately this device frequently misinterprets words/phrases.  Follow up diabetes: -complicated by hyperlipidemia, obesity, hypertension -hx of poor compliance in the past, poor diet and sedentary lifestyle -meds: glipizide, januvia, metformin, lisinopril, statin -hgba1c improved from 11 9 months ago to 8.5 1 month ago -reports: taking meds and wants to work on diet, agrees to see endo for further medication management after discussion options -denies:lows, med intol, feeling poorly from this -lots of stress right now with daughter facing challenges -due for mammogram -refused pneumococcal vaccine   ROS: See pertinent positives and negatives per HPI.  Past Medical History:  Diagnosis Date  . Colon polyp   . Diabetes mellitus   . Elevated cholesterol   . Hypertension     Past Surgical History:  Procedure Laterality Date  . ABDOMINAL HYSTERECTOMY    . COLONOSCOPY    . COLONOSCOPY WITH PROPOFOL N/A 11/08/2017   Procedure: COLONOSCOPY WITH PROPOFOL;  Surgeon: Toledo, Benay Pike, MD;  Location: [facility] ENDOSCOPY;  Service: Gastroenterology;  Laterality: N/A;    Family History  Problem Relation Age of Onset  . Diabetes Mother   . Congestive Heart Failure Mother     SOCIAL HX: see hpi   Current Outpatient Medications:  .  Alcohol Swabs PADS, Use as directed., Disp: 100 each, Rfl: 12 .  amLODipine (NORVASC) 5 MG tablet, Take 1 tablet (5 mg total) by mouth daily., Disp: 90 tablet, Rfl: 3 .  atorvastatin (LIPITOR) 20 MG tablet, Take 1 tablet (20 mg total) by mouth daily., Disp: 90 tablet, Rfl: 3 .  fluticasone (FLONASE) 50 MCG/ACT nasal spray, Place 2 sprays into both nostrils daily., Disp: 16 g, Rfl: 5 .  glipiZIDE (GLUCOTROL XL) 5 MG 24 hr tablet, Take 1 tablet (5 mg total) by mouth daily with breakfast., Disp: 90 tablet, Rfl: 1 .  glucose blood (TRUETEST TEST) test strip, TEST TWICE WEEKLY., Disp: 100 each, Rfl: 3 .  JANUVIA 100 MG tablet, TAKE  1 TABLET BY MOUTH ONCE DAILY. PATIENT NEEDS AN APPOINTMENT., Disp: 30 tablet, Rfl: 5 .  lisinopril (PRINIVIL,ZESTRIL) 30 MG tablet, TAKE 1 TABLET BY MOUTH ONCE DAILY, Disp: 90 tablet, Rfl: 1 .  loratadine (CLARITIN) 10 MG tablet, Take 10 mg by mouth daily., Disp: , Rfl:  .  metFORMIN (GLUCOPHAGE) 1000 MG tablet, TAKE 1 TABLET BY MOUTH TWICE DAILY WITH MEALS., Disp: 180 tablet, Rfl: 1 .  triamterene-hydrochlorothiazide (MAXZIDE-25) 37.5-25 MG tablet, Take 1 tablet by mouth daily., Disp: 90 tablet, Rfl: 0 .  TRUEPLUS LANCETS 30G MISC, USE AS DIRECTED 2 TIMES A WEEK, Disp: 100 each, Rfl: 3  EXAM:  Vitals:   05/09/18 1104  BP: 116/72  Pulse: (!) 107  Temp: 98.2 F (36.8 C)    Body mass index is 35.43 kg/m.  GENERAL: vitals reviewed and listed above, alert, oriented, appears well hydrated and in no acute distress  HEENT: atraumatic, conjunttiva clear, no obvious abnormalities on inspection of external nose and ears  NECK: no obvious masses on inspection  LUNGS: clear to auscultation bilaterally, no wheezes, rales or rhonchi, good air movement  CV: HRRR, no peripheral edema  MS: moves all extremities without noticeable abnormality  PSYCH: pleasant and cooperative, no obvious depression or anxiety  ASSESSMENT AND PLAN:  Discussed the following assessment and plan:  Type 2 diabetes mellitus with other specified complication, without long-term current use of insulin ([place]) - Plan: Ambulatory referral to Endocrinology  Hyperlipidemia associated with type 2 diabetes mellitus ([place])  Hypertension associated with  diabetes ([place])  Morbid obesity ([place])  -discussed implications, management, risks -refer endo for further management diabetes -in interim she plans to work on diet and exercise for diabetes and wt reduction and cont current medications -CBT/counselor info provided for stress -follow up 3 months -Patient advised to return or notify a doctor immediately if symptoms worsen or  persist or new concerns arise.  Patient Instructions  BEFORE YOU LEAVE: -order mammo -follow up: 3-4 months  Continue current medications and work on a healthy low sugar diet and regular exercise.  -We placed a referral for you as discussed to the endocrinologist. It usually takes about 1-2 weeks to process and schedule this referral. If you have not heard from [place] regarding this appointment in 2 weeks please contact our office.  Hang in there. Praying for your family.   Lucretia Kern, DO

## 2018-05-25 ENCOUNTER — Ambulatory Visit: Payer: 59 | Admitting: Internal Medicine

## 2018-05-29 MED FILL — JANUVIA 100 MG TABLET: 100 | 30 days supply | Qty: 30 | Fill #2

## 2018-06-13 MED FILL — TRIAMTERENE/HCTZ 37.5/25 TB: 37.5-25 | 30 days supply | Qty: 30 | Fill #5

## 2018-06-13 MED FILL — AMLODIPINE BESYLATE 5 MG TA: 5 | 90 days supply | Qty: 90 | Fill #1

## 2018-06-16 ENCOUNTER — Encounter: Payer: Self-pay | Admitting: Internal Medicine

## 2018-06-16 ENCOUNTER — Ambulatory Visit: Payer: 59 | Admitting: Internal Medicine

## 2018-06-16 ENCOUNTER — Other Ambulatory Visit: Payer: Self-pay

## 2018-06-16 VITALS — BP 138/88 | HR 94 | Ht 63.0 in | Wt 198.0 lb

## 2018-06-16 DIAGNOSIS — E1165 Type 2 diabetes mellitus with hyperglycemia: Secondary | ICD-10-CM | POA: Diagnosis not present

## 2018-06-16 DIAGNOSIS — Z6835 Body mass index (BMI) 35.0-35.9, adult: Secondary | ICD-10-CM | POA: Diagnosis not present

## 2018-06-16 DIAGNOSIS — Z01419 Encounter for gynecological examination (general) (routine) without abnormal findings: Secondary | ICD-10-CM | POA: Diagnosis not present

## 2018-06-16 LAB — GLUCOSE, POCT (MANUAL RESULT ENTRY): POC GLUCOSE: 249 mg/dL — AB (ref 70–99)

## 2018-06-16 LAB — POCT GLYCOSYLATED HEMOGLOBIN (HGB A1C): Hemoglobin A1C: 9.7 % — AB (ref 4.0–5.6)

## 2018-06-16 MED ORDER — TRUEPLUS LANCETS 30G MISC: 3 refills | Status: DC

## 2018-06-16 MED ORDER — SEMAGLUTIDE(0.25 OR 0.5MG/DOS) 2 MG/1.5ML ~~LOC~~ SOPN: 0.5000 mg | PEN_INJECTOR | SUBCUTANEOUS | 6 refills | Status: DC

## 2018-06-16 MED ORDER — GLUCOSE BLOOD VI STRP: ORAL_STRIP | 3 refills | Status: DC

## 2018-06-16 MED FILL — ACCU-CHEK GUIDE TEST STRIP: 75 days supply | Qty: 150 | Fill #0

## 2018-06-16 MED FILL — ACCU-CHEK FASTCLIX LANCETS: 51 days supply | Qty: 102 | Fill #0

## 2018-06-16 NOTE — Progress Notes (Signed)
Name: Laura Marsh  MRN/ DOB: [id number], 03-05-1956   Age/ Sex: 63 y.o., female    PCP: Lucretia Kern, DO   Reason for Endocrinology Evaluation: Type 2 Diabetes Mellitus     Date of Initial Endocrinology Visit: 06/16/2018     PATIENT IDENTIFIER: Laura Marsh is a 63 y.o. female with a past medical history of HTN, T2DM and Dyslipidemia. The patient presented for initial endocrinology clinic visit on 06/16/2018 for consultative assistance with her diabetes management.    HPI: Ms. Gingras was    Diagnosed with T2DM  ~ 2014 Prior Medications tried/Intolerance:  Currently checking blood sugars 2x /  week Hypoglycemia episodes : no             Hemoglobin A1c has ranged from 6.5% in 2014, peaking at 11.0% in 2019. Patient required assistance for hypoglycemia: no Patient has required hospitalization within the last 1 year from hyper or hypoglycemia: no  In terms of diet, the patient drinks diet sodas, eats 2-3 meals, and sometimes she snacks.    Has had 2 genital infections in the past    Works [facility] [employer]    [place]: Metformin 1000 mg  BID Glipizide 5 mg QAM - not taking  Januvia 100 mg daily     Statin: yes ACE-I/ARB: yes Prior Diabetic Education: yes   METER DOWNLOAD SUMMARY: did not bring    DIABETIC COMPLICATIONS: Microvascular complications:    Denies: CKD, retinopathy, neuropathy   Last eye exam: Completed 2019  Macrovascular complications:   Denies: CAD, PVD, CVA   PAST HISTORY: Past Medical History:  Past Medical History:  Diagnosis Date  . Colon polyp   . Diabetes mellitus   . Elevated cholesterol   . Hypertension    Past Surgical History:  Past Surgical History:  Procedure Laterality Date  . ABDOMINAL HYSTERECTOMY    . COLONOSCOPY    . COLONOSCOPY WITH PROPOFOL N/A 11/08/2017   Procedure: COLONOSCOPY WITH PROPOFOL;  Surgeon: Toledo, Benay Pike, MD;   Location: [facility] ENDOSCOPY;  Service: Gastroenterology;  Laterality: N/A;      Social History:  reports that she has never smoked. She has never used smokeless tobacco. She reports current alcohol use. She reports that she does not use drugs. Family History:  Family History  Problem Relation Age of Onset  . Diabetes Mother   . Congestive Heart Failure Mother      HOME MEDICATIONS: Allergies as of 06/16/2018   No Known Allergies     Medication List       Accurate as of June 16, 2018  8:44 AM. Always use your most recent med list.        Alcohol Swabs Pads Use as directed.   amLODipine 5 MG tablet Commonly known as:  NORVASC Take 1 tablet (5 mg total) by mouth daily.   atorvastatin 20 MG tablet Commonly known as:  LIPITOR Take 1 tablet (20 mg total) by mouth daily.   fluticasone 50 MCG/ACT nasal spray Commonly known as:  FLONASE Place 2 sprays into both nostrils daily.   glipiZIDE 5 MG 24 hr tablet Commonly known as:  GLUCOTROL XL Take 1 tablet (5 mg total) by mouth daily with breakfast.   glucose blood test strip Commonly known as:  TRUEtest Test TEST TWICE WEEKLY.   Januvia 100 MG tablet Generic drug:  sitaGLIPtin TAKE 1 TABLET BY MOUTH ONCE DAILY. PATIENT NEEDS AN APPOINTMENT.   lisinopril 30 MG  tablet Commonly known as:  PRINIVIL,ZESTRIL TAKE 1 TABLET BY MOUTH ONCE DAILY   loratadine 10 MG tablet Commonly known as:  CLARITIN Take 10 mg by mouth daily.   metFORMIN 1000 MG tablet Commonly known as:  GLUCOPHAGE TAKE 1 TABLET BY MOUTH TWICE DAILY WITH MEALS.   triamterene-hydrochlorothiazide 37.5-25 MG tablet Commonly known as:  MAXZIDE-25 Take 1 tablet by mouth daily.   TRUEplus Lancets 30G Misc USE AS DIRECTED 2 TIMES A WEEK        ALLERGIES: No Known Allergies   REVIEW OF SYSTEMS: A comprehensive ROS was conducted with the patient and is negative except as per HPI and below:  Review of Systems  Gastrointestinal: Positive for diarrhea  and nausea.      OBJECTIVE:   VITAL SIGNS: BP 138/88 (BP Location: Left Arm, Patient Position: Sitting, Cuff Size: Normal)   Pulse 94   Ht 5\' 3"  (1.6 m)   Wt 198 lb (89.8 kg)   SpO2 97%   BMI 35.07 kg/m    PHYSICAL EXAM:  General: Pt appears well and is in NAD  Hydration: Well-hydrated with moist mucous membranes and good skin turgor  HEENT: Head: Unremarkable with good dentition. Oropharynx clear without exudate.  Eyes: External eye exam normal without stare, lid lag or exophthalmos.  EOM intact.  PERRL.  Neck: General: Supple without adenopathy or carotid bruits. Thyroid: Thyroid size normal.  No goiter or nodules appreciated. No thyroid bruit.  Lungs: Clear with good BS bilat with no rales, rhonchi, or wheezes  Heart: RRR with normal S1 and S2 and no gallops; no murmurs; no rub  Abdomen: Normoactive bowel sounds, soft, nontender, without masses or organomegaly palpable  Extremities:  Lower extremities - No pretibial edema. No lesions.  Skin: Normal texture and temperature to palpation. No rash noted. No Acanthosis nigricans/skin tags. No lipohypertrophy.  Neuro: MS is good with appropriate affect, pt is alert and Ox3    DM foot exam: 06/16/18  The skin of the feet is intact without sores or ulcerations. The pedal pulses are 2+ on right and 2+ on left. The sensation is intact to a screening 5.07, 10 gram monofilament bilaterally   DATA REVIEWED:  Lab Results  Component Value Date   HGBA1C 8.5 (H) 03/16/2018   HGBA1C 11.0 (H) 08/02/2017   HGBA1C 8.1 (H) 12/03/2016   Lab Results  Component Value Date   MICROALBUR 0.2 12/03/2013   LDLCALC 83 03/16/2018   CREATININE 0.68 03/16/2018   Lab Results  Component Value Date   MICRALBCREAT 0.2 12/03/2013    Lab Results  Component Value Date   CHOL 144 03/16/2018   HDL 48.40 03/16/2018   LDLCALC 83 03/16/2018   LDLDIRECT 140.3 02/19/2013   TRIG 63.0 03/16/2018   CHOLHDL 3 03/16/2018        ASSESSMENT / PLAN /  RECOMMENDATIONS:   1) Type 2 Diabetes Mellitus, poorly controlled, Without complications - Most recent A1c of 9.7 %. Goal A1c < 7.0 %.    Plan: GENERAL: I have discussed with the patient the pathophysiology of diabetes. We went over the natural progression of the disease. We talked about both insulin resistance and insulin deficiency. We stressed the importance of lifestyle changes including diet and exercise. I explained the complications associated with diabetes including retinopathy, nephropathy, neuropathy as well as increased risk of cardiovascular disease. We went over the benefit seen with glycemic control.    I explained to the patient that diabetic patients are at higher than normal  risk for amputations. The patient was informed that diabetes is the number one cause of non-traumatic amputations in [place].   Encouraged patient to avoid sugar-sweetened beverages and snacks, we discussed options for low carb snacking.   She is not taking Glipizide, because she notices her face swells on it, I believe this is due to weight gain.   Poorly controlled diabetes is due to medications non-adherence and dietary indiscretions. She will be referred to our CDE for proper education.  Discussed add on therapy such as SGLT-2 inhibitors and GLP1 agonists, we discussed the risk and benefit of each group. Pt is concerned about genital infections at this time. She has opted to proceed with GLP-1 agonist.  We discussed risk of GI side effects.  We have discussed not to combine DPP- 4 inhibitors and GLP-1 agonist due to risk of pancreatitis  No hx of pancreatitis, no hx of thyroid cancer .  MEDICATIONS:  Stop Januvia  Stop Glipizide  Continue Metformin 1000 mg BID  Start Ozempic 0.25 mg weekly   EDUCATION / INSTRUCTIONS:  BG monitoring instructions: Patient is instructed to check her blood sugars 2 times a day, fasting and bedtime.  Call [facility] if: BG persistently < 70  or > 300. . I reviewed the Rule of 15 for the treatment of hypoglycemia in detail with the patient. Literature supplied.   2) Diabetic complications:   Eye: Does not have known diabetic retinopathy.   Neuro/ Feet: Does not have known diabetic peripheral neuropathy.  Renal: Patient does not have known baseline CKD. She is  on an ACEI/ARB at present.   3) Lipids: Patient is on a statin.    4) Hypertension: she is  at goal of < 140/90 mmHg.    F/u in 6 weeks    Signed electronically by: Mack Guise, MD  [facility]  [facility] [address] [place], [place] [address] Phone: [phone] FAX: [phone]   CC: Lucretia Kern, DO [address] [place] [address] Phone: [phone]  Fax: [phone]    Return to Endocrinology clinic as below: Future Appointments  Date Time Provider [place]  09/19/2018  7:30 AM Lucretia Kern, DO [facility]

## 2018-06-16 NOTE — Patient Instructions (Signed)
-   STOP GLIPIZIDE - STOP JANUVIA - Continue Metformin 1000 mg Twice a day with meals - Start Ozempic 0.25 mg weekly    - check sugar twice a day (fasting and bedtime)   - Choose healthy, lower carb lower calorie snacks: toss salad, cooked vegetables, cottage cheese, peanut butter, low fat cheese / string cheese, lower sodium deli meat, tuna salad or chicken salad

## 2018-06-22 ENCOUNTER — Other Ambulatory Visit: Payer: Self-pay | Admitting: Family Medicine

## 2018-06-22 ENCOUNTER — Telehealth: Payer: Self-pay | Admitting: Internal Medicine

## 2018-06-22 MED ORDER — DULAGLUTIDE 1.5 MG/0.5ML ~~LOC~~ SOAJ: 1.5000 mg | SUBCUTANEOUS | 6 refills | Status: DC

## 2018-06-22 MED FILL — TRULICITY 1.5 MG/0.5 ML PEN: 1.5 | 28 days supply | Qty: 2 | Fill #0

## 2018-06-22 MED FILL — JANUVIA 100 MG TABLET: 100 | 60 days supply | Qty: 60 | Fill #3

## 2018-06-22 NOTE — Telephone Encounter (Signed)
Spoke to pt and who stated she understands these instructions

## 2018-06-22 NOTE — Telephone Encounter (Signed)
Laura Marsh,    Please let her know that her insurance won't cover ozempic but they would cover trulicity.   She can continue with the Ozempic pen for now until she runs out but the prescription at the pharmacy is going to be Trulicity 1.5 mg weekly.     Thanks     Abby Nena Jordan, MD  [facility] Endocrinology  [facility] [address] [place], [place] [address] Phone: [phone] FAX: [phone]

## 2018-07-15 MED FILL — TRIAMTERENE/HCTZ 37.5/25 TB: 37.5-25 | 30 days supply | Qty: 30 | Fill #0

## 2018-07-21 ENCOUNTER — Ambulatory Visit: Payer: 59 | Admitting: Dietician

## 2018-07-28 ENCOUNTER — Encounter: Payer: Self-pay | Admitting: Internal Medicine

## 2018-07-28 ENCOUNTER — Ambulatory Visit (INDEPENDENT_AMBULATORY_CARE_PROVIDER_SITE_OTHER): Payer: 59 | Admitting: Internal Medicine

## 2018-07-28 ENCOUNTER — Other Ambulatory Visit: Payer: Self-pay

## 2018-07-28 DIAGNOSIS — E1165 Type 2 diabetes mellitus with hyperglycemia: Secondary | ICD-10-CM

## 2018-07-28 MED ORDER — METFORMIN HCL 1000 MG PO TABS: 1000.0000 mg | ORAL_TABLET | Freq: Every day | ORAL | 3 refills | Status: DC

## 2018-07-28 MED ORDER — GLIPIZIDE 5 MG PO TABS: 5.0000 mg | ORAL_TABLET | Freq: Two times a day (BID) | ORAL | 2 refills | Status: DC

## 2018-07-28 MED FILL — metFORMIN HCL 1000 MG TABS: 1000 | 90 days supply | Qty: 90 | Fill #0

## 2018-07-28 MED FILL — glipiZIDE 5 MG TABS: 5 | 90 days supply | Qty: 180 | Fill #0

## 2018-07-28 NOTE — Progress Notes (Signed)
Virtual Visit via Video Note  I connected with Adhya D Tierce on 07/28/18 at  3:20 PM EDT by a video enabled telemedicine application and verified that I am speaking with the correct person using two identifiers.   I discussed the limitations of evaluation and management by telemedicine and the availability of in person appointments. The patient expressed understanding and agreed to proceed.   -Location of the patient : Work  -Location of the provider : Office -The names of all persons participating in the telemedicine service : Pt and myself        Name: Kenniya Westrich Fix  Age/ Sex: 63 y.o., female   MRN/ DOB: [id number], 03/09/1956     PCP: Lucretia Kern, DO   Reason for Endocrinology Evaluation: Type 2 Diabetes Mellitus     Initial Endocrinology Clinic Visit: 06/16/2018    PATIENT IDENTIFIER: [name] is a 63 y.o. female with a past medical history of HTN, T2DM and Dyslipidemia. The patient has followed with Endocrinology clinic since 06/16/2018 for consultative assistance with management of her diabetes.  DIABETIC HISTORY:  Ms. Witters was diagnosed with T2DM ~ 2014. She has been on oral glycemic agents for years. She has never been on insulin therapy. Her hemoglobin A1c has ranged from 6.5% in 2014, peaking at 11.0% in 2019.   On her initial visit to our clinic she was on Metformin, Glipizide and Januvia, with an A1c 9.7 %.  We stopped Januvia and glipizide and started Ozempic with continuation of Metformin    Works [facility]  SUBJECTIVE:   During the last visit (06/16/2018): A1c 9.7%. Continued Metformin, stopped Januvia and Glipizide and started Ozempic  Today (07/28/2018): Ms. Abeln is here for a virtual 6 week diabetes follow up.  She checks her blood sugars 2 times daily. The patient has not had hypoglycemic episodes since the last clinic visit. Otherwise, the patient has not required any recent emergency interventions for  hypoglycemia and has not had recent hospitalizations secondary to hyper or hypoglycemic episodes.   Pt states taking metformin twice a day makes her nauseous. She would prefer to not take it at all.  She is doing well with Ozempic without nausea or vomiting.  Her BG's continue to be high per patient   ROS: As per HPI and as detailed below: Review of Systems  Constitutional: Negative for fever.  HENT: Negative for congestion and sore throat.   Respiratory: Negative for cough and shortness of breath.   Cardiovascular: Negative for chest pain and palpitations.  Gastrointestinal: Positive for nausea. Negative for diarrhea.  Genitourinary: Negative for frequency.  Endo/Heme/Allergies: Negative for polydipsia.      HOME DIABETES REGIMEN:  Metformin 1000 mg BID Ozempic 0.25 mg weekly     GLUCOSE LOG:  07/27/2018    Pre-supper 149 mg/dL  4/22             Fasting 200 mg/dL      HISTORY:  Past Medical History:  Past Medical History:  Diagnosis Date   Colon polyp    Diabetes mellitus    Elevated cholesterol    Hypertension     Past Surgical History:  Past Surgical History:  Procedure Laterality Date   ABDOMINAL HYSTERECTOMY     COLONOSCOPY     COLONOSCOPY WITH PROPOFOL N/A 11/08/2017   Procedure: COLONOSCOPY WITH PROPOFOL;  Surgeon: Toledo, Benay Pike, MD;  Location: [facility] ENDOSCOPY;  Service: Gastroenterology;  Laterality: N/A;     Social  History:  reports that she has never smoked. She has never used smokeless tobacco. She reports current alcohol use. She reports that she does not use drugs. Family History:  Family History  Problem Relation Age of Onset   Diabetes Mother    Congestive Heart Failure Mother       HOME MEDICATIONS: Allergies as of 07/28/2018   No Known Allergies     Medication List       Accurate as of July 28, 2018  9:04 AM. Always use your most recent med list.        Alcohol Swabs Pads Use as directed.   amLODipine 5 MG  tablet Commonly known as:  NORVASC Take 1 tablet (5 mg total) by mouth daily.   atorvastatin 20 MG tablet Commonly known as:  LIPITOR Take 1 tablet (20 mg total) by mouth daily.   Dulaglutide 1.5 MG/0.5ML Sopn Commonly known as:  Trulicity Inject 1.5 mg into the skin once a week.   fluticasone 50 MCG/ACT nasal spray Commonly known as:  FLONASE Place 2 sprays into both nostrils daily.   glucose blood test strip Commonly known as:  TRUEtest Test Twice daily   lisinopril 30 MG tablet Commonly known as:  ZESTRIL TAKE 1 TABLET BY MOUTH ONCE DAILY   loratadine 10 MG tablet Commonly known as:  CLARITIN Take 10 mg by mouth daily.   metFORMIN 1000 MG tablet Commonly known as:  GLUCOPHAGE TAKE 1 TABLET BY MOUTH TWICE DAILY WITH MEALS.   triamterene-hydrochlorothiazide 37.5-25 MG tablet Commonly known as:  MAXZIDE-25 TAKE 1 TABLET BY MOUTH ONCE DAILY (NEED APPT)   TRUEplus Lancets 30G Misc Twice daily         DATA REVIEWED:  Lab Results  Component Value Date   HGBA1C 9.7 (A) 06/16/2018   HGBA1C 8.5 (H) 03/16/2018   HGBA1C 11.0 (H) 08/02/2017   Lab Results  Component Value Date   MICROALBUR 0.2 12/03/2013   LDLCALC 83 03/16/2018   CREATININE 0.68 03/16/2018   Lab Results  Component Value Date   MICRALBCREAT 0.2 12/03/2013     Lab Results  Component Value Date   CHOL 144 03/16/2018   HDL 48.40 03/16/2018   LDLCALC 83 03/16/2018   LDLDIRECT 140.3 02/19/2013   TRIG 63.0 03/16/2018   CHOLHDL 3 03/16/2018         ASSESSMENT / PLAN / RECOMMENDATIONS:   1) Type 2 Diabetes Mellitus, poorly controlled, Without complications - Most recent A1c of 9.7 %. Goal A1c < 7.0 %.    Plan: - No proper glucose data available today , numbers available are from memory recall, but she is concerned that her BG's are running high. She would prefers to stop Metformin due to nausea with the second dose.  - We discussed reducing the dose to once a day at this time, she is  currently on Ozempic but her insurance prefers Trulicity so she will switch to that once the ozempic is finished.  - We discussed adding SGLT-2 inhibitors but she is concerned about the risk of genital infections - Will restart Glipizide, discussed the risk of weight gain (this is why we stopped it on last visit ) but at this time, BG control is a priority .     MEDICATIONS:  Decrease Metformin to 1000 mg Once daily  Start Glpizide 5 mg BID with meals  Increase Ozempic to 0.5 mg weekly, then switch to trulicity when done.   EDUCATION / INSTRUCTIONS:  BG monitoring instructions: Patient is  instructed to check her blood sugars 2 times a day, fasting and bedtime.  Call [facility] [facility] if: BG persistently < 70 or > 300.  I reviewed the Rule of 15 for the treatment of hypoglycemia in detail with the patient. Literature supplied.     I discussed the assessment and treatment plan with the patient. The patient was provided an opportunity to ask questions and all were answered. The patient agreed with the plan and demonstrated an understanding of the instructions.   The patient was advised to call back or seek an in-person evaluation if the symptoms worsen or if the condition fails to improve as anticipated.     F/U in 2 months    Signed electronically by: Mack Guise, MD  [facility] Endocrinology  [facility] [address] [place], [place] [address] Phone: [phone] FAX: [phone]   CC: Lucretia Kern, DO [address] [place] [address] Phone: [phone]  Fax: [phone]  Return to Endocrinology clinic as below: Future Appointments  Date Time Provider [place]  07/28/2018  3:20 PM Breezie Micucci, Melanie Crazier, MD [facility] None  09/01/2018  2:30 PM Clydell Hakim, RD [place] [facility]  09/19/2018  7:30 AM Lucretia Kern, DO [facility]

## 2018-07-29 ENCOUNTER — Other Ambulatory Visit: Payer: Self-pay | Admitting: Family Medicine

## 2018-07-31 MED FILL — LISINOPRIL 30 MG TABLET: 30 | 90 days supply | Qty: 90 | Fill #0

## 2018-08-17 MED FILL — ATORVASTATIN 20 MG TABLET: 20 | 90 days supply | Qty: 90 | Fill #3

## 2018-08-31 ENCOUNTER — Telehealth: Payer: Self-pay | Admitting: Internal Medicine

## 2018-08-31 MED FILL — TRULICITY 1.5 MG/0.5 ML PEN: 1.5 | 28 days supply | Qty: 2 | Fill #1

## 2018-08-31 MED FILL — TRIAMTERENE/HCTZ 37.5/25 TB: 37.5-25 | 30 days supply | Qty: 30 | Fill #1

## 2018-08-31 MED FILL — AMLODIPINE BESYLATE 5 MG TA: 5 | 90 days supply | Qty: 90 | Fill #2

## 2018-08-31 NOTE — Telephone Encounter (Signed)
Patient states that the third time she used  Trulicty she is experiencing itching and redness at the injection site. Please call patient at ph# [phone] to advise. May leave a detailed message on voice mail.

## 2018-08-31 NOTE — Telephone Encounter (Signed)
lft pt vm to return call to let me know if she is alternating sites

## 2018-09-01 ENCOUNTER — Ambulatory Visit: Payer: 59 | Admitting: Dietician

## 2018-09-08 DIAGNOSIS — Z1231 Encounter for screening mammogram for malignant neoplasm of breast: Secondary | ICD-10-CM | POA: Diagnosis not present

## 2018-09-13 ENCOUNTER — Ambulatory Visit (INDEPENDENT_AMBULATORY_CARE_PROVIDER_SITE_OTHER): Payer: 59 | Admitting: Family Medicine

## 2018-09-13 ENCOUNTER — Encounter: Payer: Self-pay | Admitting: Family Medicine

## 2018-09-13 ENCOUNTER — Other Ambulatory Visit: Payer: Self-pay

## 2018-09-13 VITALS — BP 122/80 | HR 114 | Temp 98.7°F | Wt 196.4 lb

## 2018-09-13 DIAGNOSIS — L723 Sebaceous cyst: Secondary | ICD-10-CM

## 2018-09-13 DIAGNOSIS — L089 Local infection of the skin and subcutaneous tissue, unspecified: Secondary | ICD-10-CM

## 2018-09-13 MED ORDER — DOXYCYCLINE HYCLATE 100 MG PO TABS: 100.0000 mg | ORAL_TABLET | Freq: Two times a day (BID) | ORAL | 0 refills | Status: DC

## 2018-09-13 MED FILL — DOXYCYCLINE HYCLATE 100 MG: 100 | 10 days supply | Qty: 20 | Fill #0

## 2018-09-13 NOTE — Progress Notes (Signed)
   Subjective:    Patient ID: Laura Marsh, female    DOB: 12-29-1955, 63 y.o.   MRN: [id number]  HPI Here for 2 days of a painful knot on the right upper back. She has a hx of cysts on the back, and she has had one lanced in the past. She feels well in general, no fever. Her glucoses have been much improved since getting on Trulicity, and they are ranging from 106 to 160.    Review of Systems  Constitutional: Negative.   Respiratory: Negative.   Cardiovascular: Negative.        Objective:   Physical Exam Constitutional:      Appearance: Normal appearance.  Cardiovascular:     Rate and Rhythm: Normal rate and regular rhythm.     Pulses: Normal pulses.     Heart sounds: Normal heart sounds.  Skin:    Comments: The upper back has 3 sebaceous cysts from the left side to the middle. These are not inflamed. There is also a larger inflamed cyst on the right upper back. This is red and warm and quite tender   Neurological:     Mental Status: She is alert.           Assessment & Plan:  Abscessed cyst. This was lanced with a scalpel and a large amount of purulent material was expressed. This was dressed with Neosporin and a Bandaid. Recheck prn. Cover with Doxycycline.  Alysia Penna, MD

## 2018-09-15 ENCOUNTER — Other Ambulatory Visit: Payer: Self-pay

## 2018-09-15 ENCOUNTER — Ambulatory Visit: Payer: Self-pay

## 2018-09-15 ENCOUNTER — Encounter: Payer: Self-pay | Admitting: Family Medicine

## 2018-09-15 ENCOUNTER — Telehealth: Payer: Self-pay | Admitting: *Deleted

## 2018-09-15 ENCOUNTER — Ambulatory Visit (INDEPENDENT_AMBULATORY_CARE_PROVIDER_SITE_OTHER): Payer: 59 | Admitting: Family Medicine

## 2018-09-15 DIAGNOSIS — E1169 Type 2 diabetes mellitus with other specified complication: Secondary | ICD-10-CM

## 2018-09-15 DIAGNOSIS — L089 Local infection of the skin and subcutaneous tissue, unspecified: Secondary | ICD-10-CM

## 2018-09-15 DIAGNOSIS — I10 Essential (primary) hypertension: Secondary | ICD-10-CM | POA: Diagnosis not present

## 2018-09-15 NOTE — Telephone Encounter (Signed)
Please give her a note from work for the days she was out

## 2018-09-15 NOTE — Telephone Encounter (Signed)
Pt has an appointment with Dr Volanda Napoleon this afternoon

## 2018-09-15 NOTE — Telephone Encounter (Signed)
Copied from [place] [id number]. Topic: General - Other >> Sep 15, 2018 10:43 AM Rainey Pines A wrote: Patient is requesting a doctors note to be exempt from work due to the smell of the cyst she was seen for on 09/13/2018 and would like a callback.   Dr. Sarajane Jews please advise. Thanks

## 2018-09-15 NOTE — Progress Notes (Signed)
Virtual Visit via Video Note  I connected with Laura Marsh on 09/15/18 at  3:00 PM EDT by a video enabled telemedicine application and verified that I am speaking with the correct person using two identifiers.  Location patient: home Location provider:work or home office Persons participating in the virtual visit: patient, provider  I discussed the limitations of evaluation and management by telemedicine and the availability of in person appointments. The patient expressed understanding and agreed to proceed.   HPI: Pt seen for f/u and TOC, previously seen by Dr. Maudie Mercury.    Pt seen 6/10 in office for I&D of an infected sebaceous cyst.  Started on Doxycycline.  Pt states the area on her upper back began draining blood and pus yesterday.  Pt denies fever, chills, n/v, pain.  Pt requested a note for work earlier today by provider who completed the procedure.  HTN:  Checks bp at home.  120/80.  Exercises by walking 2-3 x/wk.  Eating light in the am-hasbrown and coffee.   Tries to eat vegetables at lunch and dinner.  Eats a lot of fast food.    DM II:  fsbs in am 159.  Followed by Endocrinology, Dr. Kelton Pillar.  Taking metformin 1000 mg daily, glipizide 5 BID, trulicity wkly.  Trulicity causing itching at injection site.  Pt was taking Metformin 1000 mg BID but it started making her feel nauseous.  Tried the extended release.  Allergies: NKDA  ROS: See pertinent positives and negatives per HPI.  Past Medical History:  Diagnosis Date  . Colon polyp   . Diabetes mellitus   . Elevated cholesterol   . Hypertension     Past Surgical History:  Procedure Laterality Date  . ABDOMINAL HYSTERECTOMY    . COLONOSCOPY    . COLONOSCOPY WITH PROPOFOL N/A 11/08/2017   Procedure: COLONOSCOPY WITH PROPOFOL;  Surgeon: Toledo, Benay Pike, MD;  Location: [facility] ENDOSCOPY;  Service: Gastroenterology;  Laterality: N/A;    Family History  Problem Relation Age of Onset  . Diabetes Mother   . Congestive  Heart Failure Mother     SOCIAL HX: Pt works at [employer].   Current Outpatient Medications:  .  Alcohol Swabs PADS, Use as directed., Disp: 100 each, Rfl: 12 .  amLODipine (NORVASC) 5 MG tablet, Take 1 tablet (5 mg total) by mouth daily., Disp: 90 tablet, Rfl: 3 .  atorvastatin (LIPITOR) 20 MG tablet, Take 1 tablet (20 mg total) by mouth daily., Disp: 90 tablet, Rfl: 3 .  doxycycline (VIBRA-TABS) 100 MG tablet, Take 1 tablet (100 mg total) by mouth 2 (two) times daily., Disp: 20 tablet, Rfl: 0 .  Dulaglutide (TRULICITY) 1.5 YH/0.6CB SOPN, Inject 1.5 mg into the skin once a week., Disp: 4 pen, Rfl: 6 .  fluticasone (FLONASE) 50 MCG/ACT nasal spray, Place 2 sprays into both nostrils daily., Disp: 16 g, Rfl: 5 .  glipiZIDE (GLUCOTROL) 5 MG tablet, Take 1 tablet (5 mg total) by mouth 2 (two) times daily with a meal., Disp: 180 tablet, Rfl: 2 .  glucose blood (TRUETEST TEST) test strip, Twice daily, Disp: 150 each, Rfl: 3 .  lisinopril (ZESTRIL) 30 MG tablet, TAKE 1 TABLET BY MOUTH ONCE DAILY, Disp: 90 tablet, Rfl: 1 .  loratadine (CLARITIN) 10 MG tablet, Take 10 mg by mouth daily., Disp: , Rfl:  .  metFORMIN (GLUCOPHAGE) 1000 MG tablet, Take 1 tablet (1,000 mg total) by mouth daily with breakfast., Disp: 90 tablet, Rfl: 3 .  triamterene-hydrochlorothiazide (MAXZIDE-25)  37.5-25 MG tablet, TAKE 1 TABLET BY MOUTH ONCE DAILY (NEED APPT), Disp: 30 tablet, Rfl: 5 .  TRUEplus Lancets 30G MISC, Twice daily, Disp: 150 each, Rfl: 3  EXAM:  VITALS per patient if applicable: RR between 03-21 bpm  GENERAL: alert, oriented, appears well and in no acute distress  HEENT: atraumatic, conjunctiva clear, no obvious abnormalities on inspection of external nose and ears  NECK: normal movements of the head and neck  LUNGS: on inspection no signs of respiratory distress, breathing rate appears normal, no obvious gross SOB, gasping or wheezing  CV: no obvious cyanosis  MS: moves all visible  extremities without noticeable abnormality  PSYCH/NEURO: pleasant and cooperative, no obvious depression or anxiety, speech and thought processing grossly intact  ASSESSMENT AND PLAN:  Discussed the following assessment and plan:  Essential hypertension -controlled -continue norvasc 5 mg, lisinopril 30 mg, and triamterene-HCTZ 37.5-25 mg daily -pt encouraged to check bp at home -lifestyle modifications encouraged including reducing sodium intake.  Type 2 diabetes mellitus with other specified complication, without long-term current use of insulin ([place])  -continue f/u with Endocrinology, Dr. Kelton Pillar -continue current medications: glipizide 5 mg BID, Metformin 1000 mg daily, and trulicity 1.5 mg wkly -continue lipitor 20 mg for HLD -pt encouraged to discuss injection site pruritis at next Endo appt.  Pt advised to rotate injection sites -pt encouraged to keep a food diary to take to Nutrition appointment in a few wks.  Infected sebaceous cyst -stable -pt encouraged to complete course of doxycycline -given precautions  F/u prn   I discussed the assessment and treatment plan with the patient. The patient was provided an opportunity to ask questions and all were answered. The patient agreed with the plan and demonstrated an understanding of the instructions.   The patient was advised to call back or seek an in-person evaluation if the symptoms worsen or if the condition fails to improve as anticipated.   Billie Ruddy, MD

## 2018-09-15 NOTE — Telephone Encounter (Signed)
Patient called and says she was seen in the office by Dr. Sarajane Jews on 09/13/18 and he lanced a cyst on her shoulder. She says the cyst is draining pus and blood and has an odor that she noticed last night. She says she's on doxycycline but wonder with the odor and the drainage should it be cultured and if she's on the right antibiotics since it has an odor now. She says she cannot go to work with it draining and with the odor, so would like a work note sent to her as well as advice on what to do with the draining cyst. I advised someone from the office will call with Dr. Barbie Banner recommendation. She says she has a virtual visit today with Dr. Volanda Napoleon, who will be taking over for Dr. Maudie Mercury.   Answer Assessment - Initial Assessment Questions 1. MAIN CONCERN OR SYMPTOM:  "What is your main concern right now?" "What question do you have?" "What's the main symptom you're worried about?" (e.g., breathing difficulty, cough, fever. pain)     Bad odor since last night, draining and bleeding 2. ONSET: "When did the odor start?"     Odor was noticed last night 3. BETTER-SAME-WORSE: "Are you getting better, staying the same, or getting worse compared to how you felt at your last visit to the doctor (most recent medical visit)"?     Pain level is better, now the smell is worse, draining and bleeding 4. VISIT DATE: "When were you seen?" (Date)     09/13/18 5. VISIT DOCTOR: "What is the name of the doctor taking care of you now?"     Dr. Sarajane Jews 6. VISIT DIAGNOSIS:  "What was the main symptom or problem that you were seen for?" "Were you given a diagnosis?"      See for the cyst, he lanced it in the office 7. VISIT MEDICATIONS: "Did the physician order any new medicines for you to use?" If yes: "Have you filled the prescription and started taking the medicine?"      Doxycyclline 8. NEXT APPOINTMENT: "Have you scheduled a follow-up appointment with your doctor?"     N/A 9. PAIN: "Is there any pain?" If so, ask: "How bad is it?"   (Scale 1-10; or mild, moderate, severe)      Soreness to the tough today, no pain today 10. FEVER: "Do you have a fever?" If so, ask: "What is it, how was it measured  and when did it start?"       No fever, 98.3 11. OTHER SYMPTOMS: "Do you have any other symptoms?"       Little diarrhea yesterday, but not today  Protocols used: RECENT MEDICAL VISIT FOR ILLNESS FOLLOW-UP CALL-A-AH

## 2018-09-18 ENCOUNTER — Telehealth: Payer: Self-pay | Admitting: *Deleted

## 2018-09-18 NOTE — Telephone Encounter (Signed)
Message routed to Sarita as pt had visit with Dr Volanda Napoleon on 6/12.

## 2018-09-18 NOTE — Telephone Encounter (Signed)
Copied from [place] [id number]. Topic: General - Inquiry >> Sep 15, 2018  4:13 PM Richardo Priest, [place] wrote: Reason for CRM: Patient states she will be by Monday to pick up work note.

## 2018-09-18 NOTE — Telephone Encounter (Signed)
Work note given to pt

## 2018-09-18 NOTE — Telephone Encounter (Signed)
Letter has been done 

## 2018-09-19 ENCOUNTER — Ambulatory Visit: Payer: 59 | Admitting: Family Medicine

## 2018-09-21 ENCOUNTER — Telehealth: Payer: Self-pay | Admitting: Internal Medicine

## 2018-09-21 NOTE — Telephone Encounter (Signed)
Per [facility], "Caller states she is experiencing itching wants to know what she can do. She just wants her to leave a VM because shes going back to work."

## 2018-09-22 ENCOUNTER — Telehealth: Payer: Self-pay | Admitting: Internal Medicine

## 2018-09-22 NOTE — Telephone Encounter (Signed)
Patient states she called 09/21/18 re: she having a reaction to (itching at the injection site) Trulicity and has not heard back from office. Patient would like to know what options she has regarding the medication. Please call patient at ph# [phone] to advise.

## 2018-09-25 NOTE — Telephone Encounter (Signed)
Please advise 

## 2018-09-27 ENCOUNTER — Ambulatory Visit: Payer: 59 | Admitting: Internal Medicine

## 2018-09-29 ENCOUNTER — Other Ambulatory Visit: Payer: Self-pay

## 2018-09-29 ENCOUNTER — Ambulatory Visit: Payer: 59 | Admitting: Internal Medicine

## 2018-09-29 ENCOUNTER — Encounter: Payer: Self-pay | Admitting: Internal Medicine

## 2018-09-29 VITALS — BP 122/78 | HR 103 | Temp 98.2°F | Ht 63.0 in | Wt 199.0 lb

## 2018-09-29 DIAGNOSIS — E1165 Type 2 diabetes mellitus with hyperglycemia: Secondary | ICD-10-CM

## 2018-09-29 LAB — POCT GLYCOSYLATED HEMOGLOBIN (HGB A1C): Hemoglobin A1C: 6.9 % — AB (ref 4.0–5.6)

## 2018-09-29 MED ORDER — OZEMPIC (0.25 OR 0.5 MG/DOSE) 2 MG/1.5ML ~~LOC~~ SOPN: 0.5000 mg | PEN_INJECTOR | SUBCUTANEOUS | 6 refills | Status: DC

## 2018-09-29 MED FILL — OZEMPIC 0.25 OR 0.5 MG/DOSE: 2 | 28 days supply | Qty: 2 | Fill #0

## 2018-09-29 MED FILL — ACCU-CHEK GUIDE TEST STRIP: 75 days supply | Qty: 150 | Fill #1

## 2018-09-29 NOTE — Progress Notes (Signed)
Name: Laura Marsh  Age/ Sex: 63 y.o., female   MRN/ DOB: [id number], [birth year]     PCP: Lucretia Kern, DO   Reason for Endocrinology Evaluation: Type 2 Diabetes Mellitus     Initial Endocrinology Clinic Visit: 06/16/2018    PATIENT IDENTIFIER: Laura Marsh is a 63 y.o. female with a past medical history of HTN, T2DM and Dyslipidemia. The patient has followed with Endocrinology clinic since 06/16/2018 for consultative assistance with management of her diabetes.  DIABETIC HISTORY:  Laura Marsh was diagnosed with T2DM ~ 2014. She has been on oral glycemic agents for years. She has never been on insulin therapy. Her hemoglobin A1c has ranged from 6.5% in 2014, peaking at 11.0% in 2019.   On her initial visit to our clinic she was on Metformin, Glipizide and Januvia, with an A1c 9.7 %.  We stopped Januvia and glipizide and started Ozempic with continuation of Metformin    Works [employer]  SUBJECTIVE:   During the last visit (06/16/2018): A1c 9.7%. Continued Metformin, stopped Januvia and Glipizide and started Ozempic  Today (09/29/2018): Laura Marsh is here for a 6 week diabetes follow up.  She checks her blood sugars 2 times daily. The patient has not had hypoglycemic episodes since the last clinic visit. Otherwise, the patient has not required any recent emergency interventions for hypoglycemia and has not had recent hospitalizations secondary to hyper or hypoglycemic episodes.   Pt states taking metformin twice a day makes her nauseous. She would prefer to not take it at all.  She is doing well with Ozempic without nausea or vomiting.  Her BG's continue to be high per patient   ROS: As per HPI and as detailed below: Review of Systems  Constitutional: Negative for fever.  HENT: Negative for congestion and sore throat.   Respiratory: Negative for cough and shortness of breath.   Cardiovascular: Negative for chest pain and palpitations.   Gastrointestinal: Positive for nausea. Negative for diarrhea.  Genitourinary: Negative for frequency.  Endo/Heme/Allergies: Negative for polydipsia.      HOME DIABETES REGIMEN:  Metformin 4259 mg BID trulicity 1.5 mg weekly    METER DOWNLOAD SUMMARY: 5/28-6/26/2020 Fingerstick Blood Glucose Tests = 49 Average Number Tests/Day = 1.6 Overall Mean FS Glucose = 154 Standard Deviation = 43.0  BG Ranges: Low = 85 High = 270  Hypoglycemic Events/30 Days: BG < 50 = 0 Episodes of symptomatic severe hypoglycemia = 0    HISTORY:  Past Medical History:  Past Medical History:  Diagnosis Date  . Colon polyp   . Diabetes mellitus   . Elevated cholesterol   . Hypertension    Past Surgical History:  Past Surgical History:  Procedure Laterality Date  . ABDOMINAL HYSTERECTOMY    . COLONOSCOPY    . COLONOSCOPY WITH PROPOFOL N/A 11/08/2017   Procedure: COLONOSCOPY WITH PROPOFOL;  Surgeon: Toledo, Benay Pike, MD;  Location: [facility] ENDOSCOPY;  Service: Gastroenterology;  Laterality: N/A;    Social History:  reports that she has never smoked. She has never used smokeless tobacco. She reports current alcohol use. She reports that she does not use drugs. Family History:  Family History  Problem Relation Age of Onset  . Diabetes Mother   . Congestive Heart Failure Mother      HOME MEDICATIONS: Allergies as of 09/29/2018      Reactions   Trulicity [dulaglutide] Itching   Rash/itching at injection site  Medication List       Accurate as of September 29, 2018  3:24 PM. If you have any questions, ask your nurse or doctor.        Accu-Chek Guide w/Device Kit by Does not apply route.   Alcohol Swabs Pads Use as directed.   amLODipine 5 MG tablet Commonly known as: NORVASC Take 1 tablet (5 mg total) by mouth daily.   atorvastatin 20 MG tablet Commonly known as: LIPITOR Take 1 tablet (20 mg total) by mouth daily.   doxycycline 100 MG tablet Commonly known as: VIBRA-TABS  Take 1 tablet (100 mg total) by mouth 2 (two) times daily.   Dulaglutide 1.5 MG/0.5ML Sopn Commonly known as: Trulicity Inject 1.5 mg into the skin once a week.   fluticasone 50 MCG/ACT nasal spray Commonly known as: FLONASE Place 2 sprays into both nostrils daily.   glipiZIDE 5 MG tablet Commonly known as: Glucotrol Take 1 tablet (5 mg total) by mouth 2 (two) times daily with a meal.   Accu-Chek Guide test strip Generic drug: glucose blood 1 each by Other route as needed for other. Use as instructed to test blood sugars 2 times daily   glucose blood test strip Commonly known as: TRUEtest Test Twice daily   lisinopril 30 MG tablet Commonly known as: ZESTRIL TAKE 1 TABLET BY MOUTH ONCE DAILY   loratadine 10 MG tablet Commonly known as: CLARITIN Take 10 mg by mouth daily.   metFORMIN 1000 MG tablet Commonly known as: GLUCOPHAGE Take 1 tablet (1,000 mg total) by mouth daily with breakfast.   triamterene-hydrochlorothiazide 37.5-25 MG tablet Commonly known as: MAXZIDE-25 TAKE 1 TABLET BY MOUTH ONCE DAILY (NEED APPT)   TRUEplus Lancets 30G Misc Twice daily         DATA REVIEWED:  Lab Results  Component Value Date   HGBA1C 6.9 (A) 09/29/2018   HGBA1C 9.7 (A) 06/16/2018   HGBA1C 8.5 (H) 03/16/2018   Lab Results  Component Value Date   MICROALBUR 0.2 12/03/2013   LDLCALC 83 03/16/2018   CREATININE 0.68 03/16/2018   Lab Results  Component Value Date   MICRALBCREAT 0.2 12/03/2013     Lab Results  Component Value Date   CHOL 144 03/16/2018   HDL 48.40 03/16/2018   LDLCALC 83 03/16/2018   LDLDIRECT 140.3 02/19/2013   TRIG 63.0 03/16/2018   CHOLHDL 3 03/16/2018         ASSESSMENT / PLAN / RECOMMENDATIONS:   1) Type 2 Diabetes Mellitus, poorly controlled, Without complications - Most recent A1c of 6.9 %. Goal A1c < 7.0 %.    Plan: - Praised pt on glucose control - She is having pruritic rash at the right lower abdominal wall secondary to trulicity  , she was advised to stop this.  - We will restart Ozempic , as she did not have any side effects to this.    MEDICATIONS:  Metformin 1000 mg Once daily  Glpizide 5 mg BID with meals  Re-start  Ozempic to 0.5 mg weekly  EDUCATION / INSTRUCTIONS:  BG monitoring instructions: Patient is instructed to check her blood sugars 2 times a day, fasting and bedtime.  Call [facility] if: BG persistently < 70 or > 300. . I reviewed the Rule of 15 for the treatment of hypoglycemia in detail with the patient. Literature supplied.     I discussed the assessment and treatment plan with the patient. The patient was provided an opportunity to ask questions and all were  answered. The patient agreed with the plan and demonstrated an understanding of the instructions.   The patient was advised to call back or seek an in-person evaluation if the symptoms worsen or if the condition fails to improve as anticipated.     F/U in 2 months    Signed electronically by: Mack Guise, MD  [facility] Endocrinology  [facility] [address] [place], [place] [address] Phone: [phone] FAX: [phone]   CC: Lucretia Kern, DO [address] [place] [address] Phone: [phone]  Fax: [phone]  Return to Endocrinology clinic as below: Future Appointments  Date Time Provider [place]  10/03/2018  8:00 AM Valora Piccolo, Barnabas Lister, RD [place] NDM

## 2018-09-29 NOTE — Patient Instructions (Signed)
-   Continue Metformin 1000 mg, 1 tablet daily  - Continue Glipizide 5 mg, 1 tablet with Breakfast and supper - Stop Trulicity  - we are going to try and get prior authorization for ozempic

## 2018-10-03 ENCOUNTER — Encounter: Payer: 59 | Attending: Internal Medicine | Admitting: Dietician

## 2018-10-03 ENCOUNTER — Encounter: Payer: Self-pay | Admitting: Dietician

## 2018-10-03 ENCOUNTER — Other Ambulatory Visit: Payer: Self-pay

## 2018-10-03 DIAGNOSIS — E119 Type 2 diabetes mellitus without complications: Secondary | ICD-10-CM | POA: Diagnosis not present

## 2018-10-03 NOTE — Patient Instructions (Addendum)
Aim to be active most days.  30 minutes per day. Increase your water intake. Aim for 7 hours of sleep per night. When eating out, be aware of the fat content of the foods.  Consider the Calorie [employer] app  Your daily fat goal is 40-45 grams per day. Choose a protein and a carbohydrate with each meal. Find ways to increase your vegetable intake. Read the labels for fat and carbohydrate  Each serving of carbohydrate is 15 grams  Aim for about 45 grams of carbohydrates with each meal  Mindfulness:  Choices  Eat slowly and stop when satisfied

## 2018-10-03 NOTE — Progress Notes (Signed)
Diabetes Self-Management Education  Visit Type: First/Initial  Appt. Start Time: 0810 Appt. End Time: 0930  10/05/2018  [name], identified by name and date of birth, is a 63 y.o. female with a diagnosis of Diabetes: Type 2.   ASSESSMENT [place] includes type 2 diabetes, HTN, dyslipidemia.   Medications include:  Metformin, Ozempic (to start this week), Glipizide, (stopped trulicity due to an allergic reaction). A1C 6.9% 09/29/18 decreased from 9.7% 06/16/18 Weight:  195 lbs today and home, 191 lbs before glipizide.  She states that since seeing Dr. Kelton Pillar, she has tried to decrease her snack intake. Only drinks 4-5 cups fluid per day. Sleeps 6-7 hours per night.  Air conditioner just went out.  Patient lives with her husband.  She does the shopping and cooking.  They eat out a lot.  Her husband does not like leftovers. She works as a Technical brewer at [employer].   Height 5\' 3"  (1.6 m), weight 195 lb (88.5 kg). Body mass index is 34.54 kg/m.  Diabetes Self-Management Education - 10/03/18 1610      Visit Information   Visit Type  First/Initial      Initial Visit   Diabetes Type  Type 2    Are you currently following a meal plan?  No    Are you taking your medications as prescribed?  Yes    Date Diagnosed  2015      Health Coping   How would you rate your overall health?  Good      Psychosocial Assessment   Patient Belief/Attitude about Diabetes  Defeat/Burnout   sometimes defeated   Self-care barriers  None    Self-management support  Doctor's office;Family    Other persons present  Patient    Patient Concerns  Nutrition/Meal planning;Glycemic Control;Weight Control;Healthy Lifestyle    Special Needs  None    Preferred Learning Style  No preference indicated    Learning Readiness  Ready    What is the last grade level you completed in school?  4 years college      Pre-Education Assessment   Patient understands the diabetes disease and treatment  process.  Needs Review    Patient understands incorporating nutritional management into lifestyle.  Needs Review    Patient undertands incorporating physical activity into lifestyle.  Needs Review    Patient understands using medications safely.  Needs Review    Patient understands monitoring blood glucose, interpreting and using results  Needs Review    Patient understands prevention, detection, and treatment of acute complications.  Needs Review    Patient understands prevention, detection, and treatment of chronic complications.  Needs Review    Patient understands how to develop strategies to address psychosocial issues.  Needs Review    Patient understands how to develop strategies to promote health/change behavior.  Needs Review      Complications   Last HgB A1C per patient/outside source  6.9 %   09/29/2018 decreased from 9.7% 06/16/2018   How often do you check your blood sugar?  1-2 times/day    Fasting Blood glucose range (mg/dL)  130-179   130-165   Postprandial Blood glucose range (mg/dL)  130-179;180-200    Number of hypoglycemic episodes per month  0    Number of hyperglycemic episodes per week  3    Can you tell when your blood sugar is high?  No    Have you had a dilated eye exam in the past 12 months?  Yes  Have you had a dental exam in the past 12 months?  Yes    Are you checking your feet?  Yes    How many days per week are you checking your feet?  1      Dietary Intake   Breakfast  Tator tots OR sausage and egg biscuit OR egg, 1/2 pancake, small amount fried apples, sausage, coffee    Snack (morning)  occasional NABS or nuts    Lunch  frozen meal ([employer] or Lean Cuisine) or leftovers    Snack (afternoon)  none    Dinner  Meatloaf, potatoes, broccoli OR 3 hot hotdogs and 3 slices [employer] bread, 1/2 cup unsweetened applesauce OR Mexican- taco salad    Snack (evening)  occasional cheese stick    Beverage(s)  water (2 cups daily), diet soda (1/2-1 cup), decaf coffee  with sugar free creamer and 4 packets splenda, rare sweet tea      Exercise   Exercise Type  Light (walking / raking leaves)   walking, gardening   How many days per week to you exercise?  2    How many minutes per day do you exercise?  60    Total minutes per week of exercise  120      Patient Education   Previous Diabetes Education  Yes (please comment)   2 years ago, [place]   Disease state   Definition of diabetes, type 1 and 2, and the diagnosis of diabetes;Explored patient's options for treatment of their diabetes    Nutrition management   Role of diet in the treatment of diabetes and the relationship between the three main macronutrients and blood glucose level;Food label reading, portion sizes and measuring food.;Meal timing in regards to the patients' current diabetes medication.;Meal options for control of blood glucose level and chronic complications.;Information on hints to eating out and maintain blood glucose control.    Physical activity and exercise   Role of exercise on diabetes management, blood pressure control and cardiac health.;Helped patient identify appropriate exercises in relation to his/her diabetes, diabetes complications and other health issue.    Medications  Reviewed patients medication for diabetes, action, purpose, timing of dose and side effects.    Monitoring  Identified appropriate SMBG and/or A1C goals.;Purpose and frequency of SMBG.;Yearly dilated eye exam;Daily foot exams    Acute complications  Taught treatment of hypoglycemia - the 15 rule.;Discussed and identified patients' treatment of hyperglycemia.    Chronic complications  Relationship between chronic complications and blood glucose control;Dental care    Psychosocial adjustment  Worked with patient to identify barriers to care and solutions;Role of stress on diabetes;Identified and addressed patients feelings and concerns about diabetes      Individualized Goals (developed by patient)   Nutrition   Follow meal plan discussed    Physical Activity  Exercise 5-7 days per week;30 minutes per day    Medications  take my medication as prescribed    Monitoring   test my blood glucose as discussed    Problem Solving  healthier options when eating out or home prepared meals    Reducing Risk  increase portions of healthy fats    Health Coping  discuss diabetes with (comment)   Md, RD, CDE     Post-Education Assessment   Patient understands the diabetes disease and treatment process.  Demonstrates understanding / competency    Patient understands incorporating nutritional management into lifestyle.  Needs Review    Patient undertands incorporating physical activity into lifestyle.  Demonstrates understanding / competency    Patient understands using medications safely.  Demonstrates understanding / competency    Patient understands monitoring blood glucose, interpreting and using results  Demonstrates understanding / competency    Patient understands prevention, detection, and treatment of acute complications.  Demonstrates understanding / competency    Patient understands prevention, detection, and treatment of chronic complications.  Demonstrates understanding / competency    Patient understands how to develop strategies to address psychosocial issues.  Demonstrates understanding / competency    Patient understands how to develop strategies to promote health/change behavior.  Needs Review      Outcomes   Expected Outcomes  Demonstrated interest in learning. Expect positive outcomes    Future DMSE  PRN    Program Status  Completed       Individualized Plan for Diabetes Self-Management Training:   Learning Objective:  Patient will have a greater understanding of diabetes self-management. Patient education plan is to attend individual and/or group sessions per assessed needs and concerns.   Plan:   Patient Instructions  Aim to be active most days.  30 minutes per day. Increase your  water intake. Aim for 7 hours of sleep per night. When eating out, be aware of the fat content of the foods.  Consider the Calorie [employer] app  Your daily fat goal is 40-45 grams per day. Choose a protein and a carbohydrate with each meal. Find ways to increase your vegetable intake. Read the labels for fat and carbohydrate  Each serving of carbohydrate is 15 grams  Aim for about 45 grams of carbohydrates with each meal  Mindfulness:  Choices  Eat slowly and stop when satisfied   Expected Outcomes:  Demonstrated interest in learning. Expect positive outcomes  Education material provided: ADA - How to Thrive: A Guide for Your Journey with Diabetes, Food label handouts, Meal plan card and Snack sheet, eating out tips  If problems or questions, patient to contact team via:  Phone and Email  Future DSME appointment: PRN

## 2018-10-04 MED FILL — TRIAMTERENE/HCTZ 37.5/25 TB: 37.5-25 | 30 days supply | Qty: 30 | Fill #2

## 2018-10-31 MED FILL — LISINOPRIL 30 MG TABLET: 30 | 90 days supply | Qty: 90 | Fill #1

## 2018-10-31 MED FILL — TRIAMTERENE/HCTZ 37.5/25 TB: 37.5-25 | 30 days supply | Qty: 30 | Fill #3

## 2018-10-31 MED FILL — glipiZIDE 5 MG TABS: 5 | 90 days supply | Qty: 180 | Fill #1

## 2018-11-03 DIAGNOSIS — H524 Presbyopia: Secondary | ICD-10-CM | POA: Diagnosis not present

## 2018-11-03 DIAGNOSIS — H52221 Regular astigmatism, right eye: Secondary | ICD-10-CM | POA: Diagnosis not present

## 2018-11-07 MED FILL — OZEMPIC 0.25 OR 0.5 MG/DOSE: 2 | 28 days supply | Qty: 2 | Fill #1

## 2018-11-15 DIAGNOSIS — L720 Epidermal cyst: Secondary | ICD-10-CM | POA: Diagnosis not present

## 2018-11-15 MED FILL — MUPIROCIN 2% OINTMENT: 2 | 7 days supply | Qty: 22 | Fill #0

## 2018-11-21 ENCOUNTER — Ambulatory Visit: Payer: 59 | Admitting: Dietician

## 2018-11-23 MED FILL — DOXYCYCLINE MONO 100 MG CAP: 100 | 7 days supply | Qty: 14 | Fill #0

## 2018-11-27 ENCOUNTER — Other Ambulatory Visit: Payer: Self-pay | Admitting: Family Medicine

## 2018-11-27 MED FILL — AMLODIPINE BESYLATE 5 MG TA: 5 | 90 days supply | Qty: 90 | Fill #3

## 2018-11-27 MED FILL — metFORMIN HCL 1000 MG TABS: 1000 | 90 days supply | Qty: 90 | Fill #1

## 2018-11-28 MED FILL — ATORVASTATIN 20 MG TABLET: 20 | 90 days supply | Qty: 90 | Fill #0

## 2018-12-05 MED FILL — OZEMPIC 0.25 OR 0.5 MG/DOSE: 2 | 28 days supply | Qty: 2 | Fill #2

## 2018-12-06 ENCOUNTER — Encounter: Payer: Self-pay | Admitting: Family Medicine

## 2018-12-06 ENCOUNTER — Other Ambulatory Visit: Payer: Self-pay

## 2018-12-06 ENCOUNTER — Ambulatory Visit (INDEPENDENT_AMBULATORY_CARE_PROVIDER_SITE_OTHER): Payer: 59 | Admitting: Family Medicine

## 2018-12-06 VITALS — BP 116/80 | HR 100 | Temp 98.3°F | Wt 199.0 lb

## 2018-12-06 DIAGNOSIS — E1169 Type 2 diabetes mellitus with other specified complication: Secondary | ICD-10-CM | POA: Diagnosis not present

## 2018-12-06 DIAGNOSIS — Z0000 Encounter for general adult medical examination without abnormal findings: Secondary | ICD-10-CM | POA: Diagnosis not present

## 2018-12-06 DIAGNOSIS — I10 Essential (primary) hypertension: Secondary | ICD-10-CM

## 2018-12-06 DIAGNOSIS — R600 Localized edema: Secondary | ICD-10-CM | POA: Diagnosis not present

## 2018-12-06 DIAGNOSIS — E785 Hyperlipidemia, unspecified: Secondary | ICD-10-CM

## 2018-12-06 MED FILL — TRIAMTERENE/HCTZ 37.5/25 TB: 37.5-25 | 30 days supply | Qty: 30 | Fill #4

## 2018-12-06 NOTE — Patient Instructions (Signed)
Preventive Care 40-61 Years Old, Female °Preventive care refers to visits with your health care provider and lifestyle choices that can promote health and wellness. This includes: °· A yearly physical exam. This may also be called an annual well check. °· Regular dental visits and eye exams. °· Immunizations. °· Screening for certain conditions. °· Healthy lifestyle choices, such as eating a healthy diet, getting regular exercise, not using drugs or products that contain nicotine and tobacco, and limiting alcohol use. °What can I expect for my preventive care visit? °Physical exam °Your health care provider will check your: °· Height and weight. This may be used to calculate body mass index (BMI), which tells if you are at a healthy weight. °· Heart rate and blood pressure. °· Skin for abnormal spots. °Counseling °Your health care provider may ask you questions about your: °· Alcohol, tobacco, and drug use. °· Emotional well-being. °· Home and relationship well-being. °· Sexual activity. °· Eating habits. °· Work and work environment. °· Method of birth control. °· Menstrual cycle. °· Pregnancy history. °What immunizations do I need? ° °Influenza (flu) vaccine °· This is recommended every year. °Tetanus, diphtheria, and pertussis (Tdap) vaccine °· You may need a Td booster every 10 years. °Varicella (chickenpox) vaccine °· You may need this if you have not been vaccinated. °Zoster (shingles) vaccine °· You may need this after age 61. °Measles, mumps, and rubella (MMR) vaccine °· You may need at least one dose of MMR if you were born in 1957 or later. You may also need a second dose. °Pneumococcal conjugate (PCV13) vaccine °· You may need this if you have certain conditions and were not previously vaccinated. °Pneumococcal polysaccharide (PPSV23) vaccine °· You may need one or two doses if you smoke cigarettes or if you have certain conditions. °Meningococcal conjugate (MenACWY) vaccine °· You may need this if you  have certain conditions. °Hepatitis A vaccine °· You may need this if you have certain conditions or if you travel or work in places where you may be exposed to hepatitis A. °Hepatitis B vaccine °· You may need this if you have certain conditions or if you travel or work in places where you may be exposed to hepatitis B. °Haemophilus influenzae type b (Hib) vaccine °· You may need this if you have certain conditions. °Human papillomavirus (HPV) vaccine °· If recommended by your health care provider, you may need three doses over 6 months. °You may receive vaccines as individual doses or as more than one vaccine together in one shot (combination vaccines). Talk with your health care provider about the risks and benefits of combination vaccines. °What tests do I need? °Blood tests °· Lipid and cholesterol levels. These may be checked every 5 years, or more frequently if you are over 50 years old. °· Hepatitis C test. °· Hepatitis B test. °Screening °· Lung cancer screening. You may have this screening every year starting at age 55 if you have a 30-pack-year history of smoking and currently smoke or have quit within the past 15 years. °· Colorectal cancer screening. All adults should have this screening starting at age 50 and continuing until age 75. Your health care provider may recommend screening at age 45 if you are at increased risk. You will have tests every 1-10 years, depending on your results and the type of screening test. °· Diabetes screening. This is done by checking your blood sugar (glucose) after you have not eaten for a while (fasting). You may have this   done every 1-3 years.  Mammogram. This may be done every 1-2 years. Talk with your health care provider about when you should start having regular mammograms. This may depend on whether you have a family history of breast cancer.  BRCA-related cancer screening. This may be done if you have a family history of breast, ovarian, tubal, or peritoneal  cancers.  Pelvic exam and Pap test. This may be done every 3 years starting at age 48. Starting at age 41, this may be done every 5 years if you have a Pap test in combination with an HPV test. Other tests  Sexually transmitted disease (STD) testing.  Bone density scan. This is done to screen for osteoporosis. You may have this scan if you are at high risk for osteoporosis. Follow these instructions at home: Eating and drinking  Eat a diet that includes fresh fruits and vegetables, whole grains, lean protein, and low-fat dairy.  Take vitamin and mineral supplements as recommended by your health care provider.  Do not drink alcohol if: ? Your health care provider tells you not to drink. ? You are pregnant, may be pregnant, or are planning to become pregnant.  If you drink alcohol: ? Limit how much you have to 0-1 drink a day. ? Be aware of how much alcohol is in your drink. In the [place] one drink equals one 12 oz bottle of beer (355 mL), one 5 oz glass of wine (148 mL), or one 1 oz glass of hard liquor (44 mL). Lifestyle  Take daily care of your teeth and gums.  Stay active. Exercise for at least 30 minutes on 5 or more days each week.  Do not use any products that contain nicotine or tobacco, such as cigarettes, e-cigarettes, and chewing tobacco. If you need help quitting, ask your health care provider.  If you are sexually active, practice safe sex. Use a condom or other form of birth control (contraception) in order to prevent pregnancy and STIs (sexually transmitted infections).  If told by your health care provider, take low-dose aspirin daily starting at age 53. What's next?  Visit your health care provider once a year for a well check visit.  Ask your health care provider how often you should have your eyes and teeth checked.  Stay up to date on all vaccines. This information is not intended to replace advice given to you by your health care provider. Make sure you  discuss any questions you have with your health care provider. Document Released: 04/18/2015 Document Revised: 12/01/2017 Document Reviewed: 12/01/2017 Elsevier Patient Education  2020 [employer].  Preventing Diabetes Mellitus Complications You can take action to prevent or slow down problems that are caused by diabetes (diabetes mellitus). Following your diabetes plan and taking care of yourself can reduce your risk of serious or life-threatening complications. What actions can I take to prevent diabetes complications? Manage your diabetes   Follow instructions from your health care providers about managing your diabetes. Your diabetes may be managed by a team of health care providers who can teach you how to care for yourself and can answer questions that you have.  Educate yourself about your condition so you can make healthy choices about eating and physical activity.  Check your blood sugar (glucose) levels as often as directed. Your health care provider will help you decide how often to check your blood glucose level depending on your treatment goals and how well you are meeting them.  Ask your health care provider  if you should take low-dose aspirin daily and what dose is recommended for you. Taking low-dose aspirin daily is recommended to help prevent cardiovascular disease. Do not use nicotine or tobacco Do not use any products that contain nicotine or tobacco, such as cigarettes and e-cigarettes. If you need help quitting, ask your health care provider. Nicotine raises your risk for diabetes problems. If you quit using nicotine:  You will lower your risk for heart attack, stroke, nerve disease, and kidney disease.  Your cholesterol and blood pressure may improve.  Your blood circulation will improve. Keep your blood pressure under control Your personal target blood pressure is determined based on:  Your age.  Your medicines.  How long you have had diabetes.  Any other  medical conditions you have. To control your blood pressure:  Follow instructions from your health care provider about meal planning, exercise, and medicines.  Make sure your health care provider checks your blood pressure at every medical visit.  Monitor your blood pressure at home as told by your health care provider.  Keep your cholesterol under control To control your cholesterol:  Follow instructions from your health care provider about meal planning, exercise, and medicines.  Have your cholesterol checked at least once a year.  You may be prescribed medicine to lower cholesterol (statin). If you are not taking a statin, ask your health care provider if you should be. Controlling your cholesterol may:  Help prevent heart disease and stroke. These are the most common health problems for people with diabetes.  Improve your blood flow. Schedule and keep yearly physical exams and eye exams Your health care provider will tell you how often you need medical visits depending on your diabetes management plan. Keep all follow-up visits as directed. This is important so possible problems can be identified early and complications can be avoided or treated.  Every visit with your health care provider should include measuring your: ? Weight. ? Blood pressure. ? Blood glucose control.  Your A1c (hemoglobin A1c) level should be checked: ? At least 2 times a year, if you are meeting your treatment goals. ? 4 times a year, if you are not meeting treatment goals or if your treatment goals have changed.  Your blood lipids (lipid profile) should be checked yearly. You should also be checked yearly for protein in your urine (urine microalbumin).  If you have type 1 diabetes, get an eye exam 3-5 years after you are diagnosed, and then once a year after your first exam.  If you have type 2 diabetes, get an eye exam as soon as you are diagnosed, and then once a year after your first exam. Keep  your vaccines current It is recommended that you receive:  A flu (influenza) vaccine every year.  A pneumonia (pneumococcal) vaccine and a hepatitis B vaccine. If you are age 27 or older, you may get the pneumonia vaccine as a series of two separate shots. Ask your health care provider which other vaccines may be recommended. Take care of your feet Diabetes may cause you to have poor blood circulation to your legs and feet. Because of this, taking care of your feet is very important. Diabetes can cause:  The skin on the feet to get thinner, break more easily, and heal more slowly.  Nerve damage in your legs and feet, which results in decreased feeling. You may not notice minor injuries that could lead to serious problems. To avoid foot problems:  Check your skin and feet  every day for cuts, bruises, redness, blisters, or sores.  Schedule a foot exam with your health care provider once every year. This exam includes: ? Inspecting of the structure and skin of your feet. ? Checking the pulses and sensation in your feet.  Make sure that your health care provider performs a visual foot exam at every medical visit.  Take care of your teeth People with poorly controlled diabetes are more likely to have gum (periodontal) disease. Diabetes can make periodontal diseases harder to control. If not treated, periodontal diseases can lead to tooth loss. To prevent this:  Brush your teeth twice a day.  Floss at least once a day.  Visit your dentist 2 times a year. Drink responsibly Limit alcohol intake to no more than 1 drink a day for nonpregnant women and 2 drinks a day for men. One drink equals 12 oz of beer, 5 oz of wine, or 1 oz of hard liquor.  It is important to eat food when you drink alcohol to avoid low blood glucose (hypoglycemia). Avoid alcohol if you:  Have a history of alcohol abuse or dependence.  Are pregnant.  Have liver disease, pancreatitis, advanced neuropathy, or severe  hypertriglyceridemia. Lessen stress Living with diabetes can be stressful. When you are experiencing stress, your blood glucose may be affected in two ways:  Stress hormones may cause your blood glucose to rise.  You may be distracted from taking good care of yourself. Be aware of your stress level and make changes to help you manage challenging situations. To lower your stress levels:  Consider joining a support group.  Do planned relaxation or meditation.  Do a hobby that you enjoy.  Maintain healthy relationships.  Exercise regularly.  Work with your health care provider or a mental health professional. Summary  You can take action to prevent or slow down problems that are caused by diabetes (diabetes mellitus). Following your diabetes plan and taking care of yourself can reduce your risk of serious or life-threatening complications.  Follow instructions from your health care providers about managing your diabetes. Your diabetes may be managed by a team of health care providers who can teach you how to care for yourself and can answer questions that you have.  Your health care provider will tell you how often you need medical visits depending on your diabetes management plan. Keep all follow-up visits as directed. This is important so possible problems can be identified early and complications can be avoided or treated. This information is not intended to replace advice given to you by your health care provider. Make sure you discuss any questions you have with your health care provider. Document Released: 12/08/2010 Document Revised: 06/20/2017 Document Reviewed: 12/20/2015 Elsevier Patient Education  2020 [place] Your Hypertension Hypertension is commonly called high blood pressure. This is when the force of your blood pressing against the walls of your arteries is too strong. Arteries are blood vessels that carry blood from your heart throughout your body.  Hypertension forces the heart to work harder to pump blood, and may cause the arteries to become narrow or stiff. Having untreated or uncontrolled hypertension can cause heart attack, stroke, kidney disease, and other problems. What are blood pressure readings? A blood pressure reading consists of a higher number over a lower number. Ideally, your blood pressure should be below 120/80. The first ("top") number is called the systolic pressure. It is a measure of the pressure in your arteries as your heart beats.  The second ("bottom") number is called the diastolic pressure. It is a measure of the pressure in your arteries as the heart relaxes. What does my blood pressure reading mean? Blood pressure is classified into four stages. Based on your blood pressure reading, your health care provider may use the following stages to determine what type of treatment you need, if any. Systolic pressure and diastolic pressure are measured in a unit called mm Hg. Normal  Systolic pressure: below 786.  Diastolic pressure: below 80. Elevated  Systolic pressure: 754-492.  Diastolic pressure: below 80. Hypertension stage 1  Systolic pressure: 010-071.  Diastolic pressure: 21-97. Hypertension stage 2  Systolic pressure: 588 or above.  Diastolic pressure: 90 or above. What health risks are associated with hypertension? Managing your hypertension is an important responsibility. Uncontrolled hypertension can lead to:  A heart attack.  A stroke.  A weakened blood vessel (aneurysm).  Heart failure.  Kidney damage.  Eye damage.  Metabolic syndrome.  Memory and concentration problems. What changes can I make to manage my hypertension? Hypertension can be managed by making lifestyle changes and possibly by taking medicines. Your health care provider will help you make a plan to bring your blood pressure within a normal range. Eating and drinking   Eat a diet that is high in fiber and potassium,  and low in salt (sodium), added sugar, and fat. An example eating plan is called the DASH (Dietary Approaches to Stop Hypertension) diet. To eat this way: ? Eat plenty of fresh fruits and vegetables. Try to fill half of your plate at each meal with fruits and vegetables. ? Eat whole grains, such as whole wheat pasta, brown rice, or whole grain bread. Fill about one quarter of your plate with whole grains. ? Eat low-fat diary products. ? Avoid fatty cuts of meat, processed or cured meats, and poultry with skin. Fill about one quarter of your plate with lean proteins such as fish, chicken without skin, beans, eggs, and tofu. ? Avoid premade and processed foods. These tend to be higher in sodium, added sugar, and fat.  Reduce your daily sodium intake. Most people with hypertension should eat less than 1,500 mg of sodium a day.  Limit alcohol intake to no more than 1 drink a day for nonpregnant women and 2 drinks a day for men. One drink equals 12 oz of beer, 5 oz of wine, or 1 oz of hard liquor. Lifestyle  Work with your health care provider to maintain a healthy body weight, or to lose weight. Ask what an ideal weight is for you.  Get at least 30 minutes of exercise that causes your heart to beat faster (aerobic exercise) most days of the week. Activities may include walking, swimming, or biking.  Include exercise to strengthen your muscles (resistance exercise), such as weight lifting, as part of your weekly exercise routine. Try to do these types of exercises for 30 minutes at least 3 days a week.  Do not use any products that contain nicotine or tobacco, such as cigarettes and e-cigarettes. If you need help quitting, ask your health care provider.  Control any long-term (chronic) conditions you have, such as high cholesterol or diabetes. Monitoring  Monitor your blood pressure at home as told by your health care provider. Your personal target blood pressure may vary depending on your medical  conditions, your age, and other factors.  Have your blood pressure checked regularly, as often as told by your health care provider. Working with your  health care provider  Review all the medicines you take with your health care provider because there may be side effects or interactions.  Talk with your health care provider about your diet, exercise habits, and other lifestyle factors that may be contributing to hypertension.  Visit your health care provider regularly. Your health care provider can help you create and adjust your plan for managing hypertension. Will I need medicine to control my blood pressure? Your health care provider may prescribe medicine if lifestyle changes are not enough to get your blood pressure under control, and if:  Your systolic blood pressure is 130 or higher.  Your diastolic blood pressure is 80 or higher. Take medicines only as told by your health care provider. Follow the directions carefully. Blood pressure medicines must be taken as prescribed. The medicine does not work as well when you skip doses. Skipping doses also puts you at risk for problems. Contact a health care provider if:  You think you are having a reaction to medicines you have taken.  You have repeated (recurrent) headaches.  You feel dizzy.  You have swelling in your ankles.  You have trouble with your vision. Get help right away if:  You develop a severe headache or confusion.  You have unusual weakness or numbness, or you feel faint.  You have severe pain in your chest or abdomen.  You vomit repeatedly.  You have trouble breathing. Summary  Hypertension is when the force of blood pumping through your arteries is too strong. If this condition is not controlled, it may put you at risk for serious complications.  Your personal target blood pressure may vary depending on your medical conditions, your age, and other factors. For most people, a normal blood pressure is less  than 120/80.  Hypertension is managed by lifestyle changes, medicines, or both. Lifestyle changes include weight loss, eating a healthy, low-sodium diet, exercising more, and limiting alcohol. This information is not intended to replace advice given to you by your health care provider. Make sure you discuss any questions you have with your health care provider. Document Released: 12/15/2011 Document Revised: 07/14/2018 Document Reviewed: 02/18/2016 Elsevier Patient Education  2020 [employer].  Edema  Edema is an abnormal buildup of fluids in the body tissues and under the skin. Swelling of the legs, feet, and ankles is a common symptom that becomes more likely as you get older. Swelling is also common in looser tissues, like around the eyes. When the affected area is squeezed, the fluid may move out of that spot and leave a dent for a few moments. This dent is called pitting edema. There are many possible causes of edema. Eating too much salt (sodium) and being on your feet or sitting for a long time can cause edema in your legs, feet, and ankles. Hot weather may make edema worse. Common causes of edema include:  Heart failure.  Liver or kidney disease.  Weak leg blood vessels.  Cancer.  An injury.  Pregnancy.  Medicines.  Being obese.  Low protein levels in the blood. Edema is usually painless. Your skin may look swollen or shiny. Follow these instructions at home:  Keep the affected body part raised (elevated) above the level of your heart when you are sitting or lying down.  Do not sit still or stand for long periods of time.  Do not wear tight clothing. Do not wear garters on your upper legs.  Exercise your legs to get your circulation going. This helps  to move the fluid back into your blood vessels, and it may help the swelling go down.  Wear elastic bandages or support stockings to reduce swelling as told by your health care provider.  Eat a low-salt (low-sodium)  diet to reduce fluid as told by your health care provider.  Depending on the cause of your swelling, you may need to limit how much fluid you drink (fluid restriction).  Take over-the-counter and prescription medicines only as told by your health care provider. Contact a health care provider if:  Your edema does not get better with treatment.  You have heart, liver, or kidney disease and have symptoms of edema.  You have sudden and unexplained weight gain. Get help right away if:  You develop shortness of breath or chest pain.  You cannot breathe when you lie down.  You develop pain, redness, or warmth in the swollen areas.  You have heart, liver, or kidney disease and suddenly get edema.  You have a fever and your symptoms suddenly get worse. Summary  Edema is an abnormal buildup of fluids in the body tissues and under the skin.  Eating too much salt (sodium) and being on your feet or sitting for a long time can cause edema in your legs, feet, and ankles.  Keep the affected body part raised (elevated) above the level of your heart when you are sitting or lying down. This information is not intended to replace advice given to you by your health care provider. Make sure you discuss any questions you have with your health care provider. Document Released: 03/22/2005 Document Revised: 03/25/2017 Document Reviewed: 04/24/2016 Elsevier Patient Education  2020 [employer].

## 2018-12-06 NOTE — Progress Notes (Signed)
Subjective:     Laura Marsh is a 62 y.o. female and is here for a comprehensive physical exam. The patient reports problems - LE edema. Pt notes increased LE edema after sleeping in a recliner.  Pt endorses slight increase in sodium intake (bacon).  On triamterene-HCTZ.  Followed by Endo, taking Ozempic for DM.  Am fsbs 149.    Followed by [facility] for pap and mammogram. -Had colonoscopy 11/08/2017.  States due for repeat in 1 yr.  Social History   Socioeconomic History  . Marital status: Divorced    Spouse name: Not on file  . Number of children: Not on file  . Years of education: Not on file  . Highest education level: Not on file  Occupational History  . Not on file  Social Needs  . Financial resource strain: Not on file  . Food insecurity    Worry: Not on file    Inability: Not on file  . Transportation needs    Medical: Not on file    Non-medical: Not on file  Tobacco Use  . Smoking status: Never Smoker  . Smokeless tobacco: Never Used  Substance and Sexual Activity  . Alcohol use: Yes    Comment: rareky  . Drug use: No  . Sexual activity: Not on file  Lifestyle  . Physical activity    Days per week: Not on file    Minutes per session: Not on file  . Stress: Not on file  Relationships  . Social Herbalist on phone: Not on file    Gets together: Not on file    Attends religious service: Not on file    Active member of club or organization: Not on file    Attends meetings of clubs or organizations: Not on file    Relationship status: Not on file  . Intimate partner violence    Fear of current or ex partner: Not on file    Emotionally abused: Not on file    Physically abused: Not on file    Forced sexual activity: Not on file  Other Topics Concern  . Not on file  Social History Narrative  . Not on file   Health Maintenance  Topic Date Due  . PNEUMOCOCCAL POLYSACCHARIDE VACCINE AGE 35-64 HIGH RISK  12/18/1959  . MAMMOGRAM  05/06/2018  .  OPHTHALMOLOGY EXAM  06/04/2018  . INFLUENZA VACCINE  11/04/2018  . COLONOSCOPY  11/09/2018  . HIV Screening  06/13/2024 (Originally 12/17/1970)  . FOOT EXAM  03/17/2019  . HEMOGLOBIN A1C  03/31/2019  . PAP SMEAR-Modifier  05/06/2020  . TETANUS/TDAP  09/03/2020  . Hepatitis C Screening  Completed    The following portions of the patient's history were reviewed and updated as appropriate: allergies, current medications, past family history, past medical history, past social history, past surgical history and problem list.  Review of Systems Pertinent items noted in HPI and remainder of comprehensive ROS otherwise negative.   Objective:    BP 116/80   Pulse 100   Temp 98.3 F (36.8 C) (Temporal)   Wt 199 lb (90.3 kg)   SpO2 96%   BMI 35.25 kg/m  General appearance: alert, cooperative and no distress Head: Normocephalic, without obvious abnormality, atraumatic Eyes: conjunctivae/corneas clear. PERRL, EOM's intact. Fundi benign. Ears: normal TM's and external ear canals both ears Nose: Nares normal. Septum midline. Mucosa normal. No drainage or sinus tenderness. Throat: lips, mucosa, and tongue normal; teeth and gums normal Neck: no  adenopathy, no carotid bruit, no JVD, supple, symmetrical, trachea midline and thyroid not enlarged, symmetric, no tenderness/mass/nodules Lungs: clear to auscultation bilaterally Heart: regular rate and rhythm, S1, S2 normal, no murmur, click, rub or gallop  1+ pitting edema in b/l LEs to inferior knee Abdomen: soft, non-tender; bowel sounds normal; no masses,  no organomegaly Extremities: extremities normal, atraumatic, no cyanosis or edema Pulses: 2+ and symmetric Skin: Skin color, texture, turgor normal. No rashes or lesions Lymph nodes: Cervical, supraclavicular, and axillary nodes normal. Neurologic: Alert and oriented X 3, normal strength and tone. Normal symmetric reflexes. Normal coordination and gait    Diabetic Foot Exam - Simple   Simple  Foot Form Diabetic Foot exam was performed with the following findings: Yes 12/06/2018  3:00 PM  Visual Inspection No deformities, no ulcerations, no other skin breakdown bilaterally: Yes Sensation Testing Intact to touch and monofilament testing bilaterally: Yes Pulse Check Posterior Tibialis and Dorsalis pulse intact bilaterally: Yes Comments Slightly decreased vibratory sensation in L foot compared to R foot.       Assessment:    Healthy female exam with LE edema.     Plan:     Anticipatory guidance given including wearing seatbelts, smoke detectors in the home, increasing physical activity, increasing p.o. intake of water and vegetables. -will obtain labs -pap and mammogram up to date, followed by OB/Gyn See After Visit Summary for Counseling Recommendations    Type 2 diabetes mellitus with other specified complication, without long-term current use of insulin ([place]) -continue ozempic -hgb A1C in 6.9% on 09/29/18 -continue lifestyle modifications -continue f/u with Endocrinology, Dr. Kelton Pillar  Bilateral lower extremity edema  -TED hose, elevation - Plan: TSH, T4, Free, Brain Natriuretic Peptide  Hyperlipidemia, unspecified hyperlipidemia type  - Plan: Lipid panel  Essential hypertension  -controlled -continue triamterene-HCTZ 37.5-25 mg daily - Plan: Basic metabolic panel  F/u prn for LE edema  Grier Mitts, MD

## 2018-12-07 LAB — CBC WITH DIFFERENTIAL/PLATELET
Basophils Absolute: 0.1 10*3/uL (ref 0.0–0.1)
Basophils Relative: 1.3 % (ref 0.0–3.0)
Eosinophils Absolute: 0.3 10*3/uL (ref 0.0–0.7)
Eosinophils Relative: 4 % (ref 0.0–5.0)
HCT: 36.9 % (ref 36.0–46.0)
Hemoglobin: 11.6 g/dL — ABNORMAL LOW (ref 12.0–15.0)
Lymphocytes Relative: 36.5 % (ref 12.0–46.0)
Lymphs Abs: 3 10*3/uL (ref 0.7–4.0)
MCHC: 31.4 g/dL (ref 30.0–36.0)
MCV: 85.4 fl (ref 78.0–100.0)
Monocytes Absolute: 0.2 10*3/uL (ref 0.1–1.0)
Monocytes Relative: 2.8 % — ABNORMAL LOW (ref 3.0–12.0)
Neutro Abs: 4.5 10*3/uL (ref 1.4–7.7)
Neutrophils Relative %: 55.4 % (ref 43.0–77.0)
Platelets: 313 10*3/uL (ref 150.0–400.0)
RBC: 4.32 Mil/uL (ref 3.87–5.11)
RDW: 13.6 % (ref 11.5–15.5)
WBC: 8.2 10*3/uL (ref 4.0–10.5)

## 2018-12-07 LAB — BASIC METABOLIC PANEL
BUN: 18 mg/dL (ref 6–23)
CO2: 32 mEq/L (ref 19–32)
Calcium: 10.3 mg/dL (ref 8.4–10.5)
Chloride: 103 mEq/L (ref 96–112)
Creatinine, Ser: 0.8 mg/dL (ref 0.40–1.20)
GFR: 88.24 mL/min (ref >60.00)
Glucose, Bld: 108 mg/dL — ABNORMAL HIGH (ref 70–99)
Potassium: 4 mEq/L (ref 3.5–5.1)
Sodium: 143 mEq/L (ref 135–145)

## 2018-12-07 LAB — LIPID PANEL
Cholesterol: 155 mg/dL (ref 0–200)
HDL: 52.5 mg/dL (ref >39.00)
LDL Cholesterol: 84 mg/dL (ref 0–99)
NonHDL: 102.61
Total CHOL/HDL Ratio: 3
Triglycerides: 95 mg/dL (ref 0.0–149.0)
VLDL: 19 mg/dL (ref 0.0–40.0)

## 2018-12-07 LAB — T4, FREE: Free T4: 0.9 ng/dL (ref 0.60–1.60)

## 2018-12-07 LAB — TSH: TSH: 0.36 u[IU]/mL (ref 0.35–4.50)

## 2018-12-07 LAB — BRAIN NATRIURETIC PEPTIDE: Pro B Natriuretic peptide (BNP): 10 pg/mL (ref 0.0–100.0)

## 2018-12-08 ENCOUNTER — Encounter: Payer: Self-pay | Admitting: Family Medicine

## 2018-12-28 DIAGNOSIS — L918 Other hypertrophic disorders of the skin: Secondary | ICD-10-CM | POA: Diagnosis not present

## 2018-12-28 DIAGNOSIS — L309 Dermatitis, unspecified: Secondary | ICD-10-CM | POA: Diagnosis not present

## 2018-12-28 DIAGNOSIS — L910 Hypertrophic scar: Secondary | ICD-10-CM | POA: Diagnosis not present

## 2018-12-29 MED FILL — TRIAMCINOLONE 0.1% OINTMENT: 0.1 | 30 days supply | Qty: 80 | Fill #0

## 2019-01-01 MED FILL — OZEMPIC 0.25 OR 0.5 MG/DOSE: 2 | 28 days supply | Qty: 2 | Fill #3

## 2019-01-02 MED FILL — ACCU-CHEK GUIDE TEST STRIP: 75 days supply | Qty: 150 | Fill #2

## 2019-01-05 ENCOUNTER — Other Ambulatory Visit: Payer: Self-pay

## 2019-01-05 ENCOUNTER — Ambulatory Visit: Payer: 59 | Admitting: Internal Medicine

## 2019-01-05 ENCOUNTER — Encounter: Payer: Self-pay | Admitting: Internal Medicine

## 2019-01-05 VITALS — BP 118/78 | HR 101 | Temp 98.0°F | Ht 63.0 in | Wt 197.2 lb

## 2019-01-05 DIAGNOSIS — E1165 Type 2 diabetes mellitus with hyperglycemia: Secondary | ICD-10-CM

## 2019-01-05 LAB — POCT GLYCOSYLATED HEMOGLOBIN (HGB A1C): Hemoglobin A1C: 6.7 % — AB (ref 4.0–5.6)

## 2019-01-05 MED ORDER — GLIPIZIDE 5 MG PO TABS: ORAL_TABLET | ORAL | 6 refills | Status: DC

## 2019-01-05 MED FILL — glipiZIDE 5 MG TABS: 5 | 30 days supply | Qty: 90 | Fill #0

## 2019-01-05 NOTE — Patient Instructions (Signed)
-   Glipizide 5 mg, 1 tablet with Breakfast and 2 tablets with Supper - Continue Metformin 1000 mg daily  - Continue Ozempic 0.5 mg weekly     HOW TO TREAT LOW BLOOD SUGARS (Blood sugar LESS THAN 70 MG/DL)  Please follow the RULE OF 15 for the treatment of hypoglycemia treatment (when your (blood sugars are less than 70 mg/dL)    STEP 1: Take 15 grams of carbohydrates when your blood sugar is low, which includes:   3-4 GLUCOSE TABS  OR  3-4 OZ OF JUICE OR REGULAR SODA OR  ONE TUBE OF GLUCOSE GEL     STEP 2: RECHECK blood sugar in 15 MINUTES STEP 3: If your blood sugar is still low at the 15 minute recheck --> then, go back to STEP 1 and treat AGAIN with another 15 grams of carbohydrates.

## 2019-01-05 NOTE — Progress Notes (Signed)
Name: Laura Marsh  Age/ Sex: 63 y.o., female   MRN/ DOB: [id number], June 13, 1955     PCP: Billie Ruddy, MD   Reason for Endocrinology Evaluation: Type 2 Diabetes Mellitus     Initial Endocrinology Clinic Visit: 06/16/2018    PATIENT IDENTIFIER: Laura Marsh is a 63 y.o. female with a past medical history of HTN, T2DM and Dyslipidemia. The patient has followed with Endocrinology clinic since 06/16/2018 for consultative assistance with management of her diabetes.  DIABETIC HISTORY:  Laura Marsh was diagnosed with T2DM ~ 2014. She has been on oral glycemic agents for years. She has never been on insulin therapy. Her hemoglobin A1c has ranged from 6.5% in 2014, peaking at 11.0% in 2019.   On her initial visit to our clinic she was on Metformin, Glipizide and Januvia, with an A1c 9.7 %.  We stopped Januvia and glipizide and started Ozempic with continuation of Metformin    [facility]  SUBJECTIVE:   During the last visit (09/29/2018): We continued Metformin, Glipizide and re-started Ozempic  Today (01/08/2019): Laura Marsh is here for a 3 month diabetes follow up.  She checks her blood sugars 2 times daily. The patient has not had hypoglycemic episodes since the last clinic visit. Otherwise, the patient has not required any recent emergency interventions for hypoglycemia and has not had recent hospitalizations secondary to hyper or hypoglycemic episodes.   She is doing well with Ozempic without nausea or vomiting.    ROS: As per HPI and as detailed below: Review of Systems  Constitutional: Negative for fever.  HENT: Negative for congestion and sore throat.   Respiratory: Negative for cough and shortness of breath.   Cardiovascular: Negative for chest pain and palpitations.  Gastrointestinal: Negative for diarrhea and nausea.  Genitourinary: Negative for frequency.  Endo/Heme/Allergies: Negative for polydipsia.      HOME DIABETES  REGIMEN:   Metformin 1000 mg Once daily  Glipizide 5 mg BID with meals  Ozempic to 0.5 mg weekly    METER DOWNLOAD SUMMARY: 9/3-10/05/2018 Fingerstick Blood Glucose Tests = 33      HISTORY:  Past Medical History:  Past Medical History:  Diagnosis Date  . Colon polyp   . Diabetes mellitus   . Elevated cholesterol   . Hypertension    Past Surgical History:  Past Surgical History:  Procedure Laterality Date  . ABDOMINAL HYSTERECTOMY    . COLONOSCOPY    . COLONOSCOPY WITH PROPOFOL N/A 11/08/2017   Procedure: COLONOSCOPY WITH PROPOFOL;  Surgeon: Toledo, Benay Pike, MD;  Location: [facility] ENDOSCOPY;  Service: Gastroenterology;  Laterality: N/A;    Social History:  reports that she has never smoked. She has never used smokeless tobacco. She reports current alcohol use. She reports that she does not use drugs. Family History:  Family History  Problem Relation Age of Onset  . Diabetes Mother   . Congestive Heart Failure Mother      HOME MEDICATIONS: Allergies as of 01/05/2019      Reactions   Trulicity [dulaglutide] Itching   Rash/itching at injection site      Medication List       Accurate as of January 05, 2019 11:59 PM. If you have any questions, ask your nurse or doctor.        Accu-Chek Guide w/Device Kit by Does not apply route.   Alcohol Swabs Pads Use as directed.   amLODipine 5 MG tablet Commonly known as:  NORVASC Take 1 tablet (5 mg total) by mouth daily.   atorvastatin 20 MG tablet Commonly known as: LIPITOR TAKE 1 TABLET BY MOUTH DAILY.   doxycycline 100 MG tablet Commonly known as: VIBRA-TABS Take 1 tablet (100 mg total) by mouth 2 (two) times daily.   fluticasone 50 MCG/ACT nasal spray Commonly known as: FLONASE Place 2 sprays into both nostrils daily.   glipiZIDE 5 MG tablet Commonly known as: Glucotrol Take 1 tablet (5 mg total) by mouth daily before breakfast AND 2 tablets (10 mg total) daily before supper. What changed: See the new  instructions. Changed by: Dorita Sciara, MD   Accu-Chek Guide test strip Generic drug: glucose blood 1 each by Other route as needed for other. Use as instructed to test blood sugars 2 times daily   glucose blood test strip Commonly known as: TRUEtest Test Twice daily   lisinopril 30 MG tablet Commonly known as: ZESTRIL TAKE 1 TABLET BY MOUTH ONCE DAILY   loratadine 10 MG tablet Commonly known as: CLARITIN Take 10 mg by mouth daily.   metFORMIN 1000 MG tablet Commonly known as: GLUCOPHAGE Take 1 tablet (1,000 mg total) by mouth daily with breakfast.   multivitamin with minerals Tabs tablet Take 1 tablet by mouth daily.   Ozempic (0.25 or 0.5 MG/DOSE) 2 MG/1.5ML Sopn Generic drug: Semaglutide(0.25 or 0.'5MG'$ /DOS) Inject 0.5 mg into the skin once a week.   triamcinolone ointment 0.1 % Commonly known as: KENALOG   triamterene-hydrochlorothiazide 37.5-25 MG tablet Commonly known as: MAXZIDE-25 TAKE 1 TABLET BY MOUTH ONCE DAILY (NEED APPT)   TRUEplus Lancets 30G Misc Twice daily         DATA REVIEWED:  Lab Results  Component Value Date   HGBA1C 6.7 (A) 01/05/2019   HGBA1C 6.9 (A) 09/29/2018   HGBA1C 9.7 (A) 06/16/2018   Lab Results  Component Value Date   MICROALBUR 0.2 12/03/2013   LDLCALC 84 12/06/2018   CREATININE 0.80 12/06/2018   Lab Results  Component Value Date   MICRALBCREAT 0.2 12/03/2013     Lab Results  Component Value Date   CHOL 155 12/06/2018   HDL 52.50 12/06/2018   LDLCALC 84 12/06/2018   LDLDIRECT 140.3 02/19/2013   TRIG 95.0 12/06/2018   CHOLHDL 3 12/06/2018         ASSESSMENT / PLAN / RECOMMENDATIONS:   1) Type 2 Diabetes Mellitus, poorly controlled, Without complications - Most recent A1c of 6.7 %. Goal A1c < 7.0 %.    Plan: - Praised pt on glucose control but in review of her glucose download she was noted within the past couple of weeks sh ehas been noted to have higher morning readings in the 180's. This is due to  high carb intake at supper times.  - Pt admits to dietary indiscretions at times. We discussed lowering her CHO intake with supper, vs exercising vs increasing the glipizide with supper. Pt opted to increase evening glipizide dose at this time and will continue to work on her CHO intake.    MEDICATIONS:  Metformin 1000 mg Once daily  Glipizide 5 mg, 1 tab with breakfast and 2 tabswith supper    Ozempic to 0.5 mg weekly  EDUCATION / INSTRUCTIONS:  BG monitoring instructions: Patient is instructed to check her blood sugars 2 times a day, fasting and bedtime.  Call [facility] clinic if: BG persistently < 70 or > 300. . I reviewed the Rule of 15 for the treatment of hypoglycemia in detail with  the patient. Literature supplied.       F/U in 3 months    Signed electronically by: Mack Guise, MD  [facility]  [facility] [address] [place], [place] [address] Phone: [phone] FAX: [phone]   CC: Billie Ruddy, [place] [address] Phone: [phone]  Fax: [phone]  Return to Endocrinology clinic as below: Future Appointments  Date Time Provider [place]  05/11/2019  2:00 PM , Melanie Crazier, MD [facility] None

## 2019-01-15 MED FILL — TRIAMTERENE-HCTZ 37.5-25 MG: 37.5-25 | 30 days supply | Qty: 30 | Fill #5

## 2019-01-23 ENCOUNTER — Other Ambulatory Visit: Payer: Self-pay | Admitting: Family Medicine

## 2019-01-23 MED FILL — LISINOPRIL 30 MG TABLET: 30 | 90 days supply | Qty: 90 | Fill #0

## 2019-01-23 MED FILL — OZEMPIC 0.25 OR 0.5 MG/DOSE: 2 | 28 days supply | Qty: 2 | Fill #4

## 2019-02-09 DIAGNOSIS — Z01812 Encounter for preprocedural laboratory examination: Secondary | ICD-10-CM | POA: Diagnosis not present

## 2019-02-09 DIAGNOSIS — Z86010 Personal history of colonic polyps: Secondary | ICD-10-CM | POA: Diagnosis not present

## 2019-02-09 DIAGNOSIS — Z8371 Family history of colonic polyps: Secondary | ICD-10-CM | POA: Diagnosis not present

## 2019-02-09 MED FILL — POLYETHYLENE GLYCOL 3350 PO: 17 | 1 days supply | Qty: 238 | Fill #0

## 2019-02-15 ENCOUNTER — Other Ambulatory Visit: Payer: Self-pay | Admitting: Family Medicine

## 2019-02-15 MED FILL — metFORMIN HCL 1000 MG TABS: 1000 | 90 days supply | Qty: 90 | Fill #2

## 2019-02-15 MED FILL — OZEMPIC 0.25 OR 0.5 MG/DOSE: 2 | 28 days supply | Qty: 2 | Fill #5

## 2019-02-19 MED FILL — TRIAMTERENE-HCTZ 37.5-25 MG: 37.5-25 | 30 days supply | Qty: 30 | Fill #0

## 2019-03-06 MED FILL — glipiZIDE 5 MG TABS: 5 | 30 days supply | Qty: 90 | Fill #1

## 2019-03-08 ENCOUNTER — Telehealth: Payer: Self-pay | Admitting: Family Medicine

## 2019-03-08 MED ORDER — AMLODIPINE BESYLATE 5 MG PO TABS: 5.0000 mg | ORAL_TABLET | Freq: Every day | ORAL | 0 refills | Status: DC

## 2019-03-08 NOTE — Telephone Encounter (Signed)
Medication: amLODipine (NORVASC) 5 MG tablet [id number]   Has the patient contacted their pharmacy? Yes  (Agent: If no, request that the patient contact the pharmacy for the refill.) (Agent: If yes, when and what did the pharmacy advise?)  Preferred Pharmacy (with phone number or street name): [place], [place] - [place] [phone] (Phone) [phone] (Fax)    Agent: Please be advised that RX refills may take up to 3 business days. We ask that you follow-up with your pharmacy.

## 2019-03-09 MED FILL — OZEMPIC 0.25 OR 0.5 MG/DOSE: 2 | 28 days supply | Qty: 2 | Fill #6

## 2019-03-14 NOTE — Telephone Encounter (Signed)
Pt needs refill, please advise

## 2019-03-14 NOTE — Telephone Encounter (Signed)
See request °

## 2019-03-14 NOTE — Telephone Encounter (Signed)
Pharmacy called in and stated they did not get this refill request.  Please advise

## 2019-03-15 ENCOUNTER — Other Ambulatory Visit: Payer: Self-pay | Admitting: *Deleted

## 2019-03-15 MED ORDER — AMLODIPINE BESYLATE 5 MG PO TABS: 5.0000 mg | ORAL_TABLET | Freq: Every day | ORAL | 0 refills | Status: DC

## 2019-03-15 MED FILL — AMLODIPINE BESYLATE 5 MG TA: 5 | 90 days supply | Qty: 90 | Fill #0

## 2019-03-15 NOTE — Telephone Encounter (Signed)
Refill already responded to

## 2019-03-21 ENCOUNTER — Other Ambulatory Visit: Payer: Self-pay | Admitting: Internal Medicine

## 2019-03-21 MED FILL — FREESTYLE LITE METER: 30 days supply | Qty: 1 | Fill #0

## 2019-03-21 MED FILL — FREESTYLE LITE TEST STRIP: 50 days supply | Qty: 100 | Fill #0

## 2019-03-21 MED FILL — FREESTYLE LANCETS: 50 days supply | Qty: 100 | Fill #0

## 2019-03-27 MED FILL — ATORVASTATIN 20 MG TABLET: 20 | 90 days supply | Qty: 90 | Fill #1

## 2019-03-27 MED FILL — TRIAMTERENE-HCTZ 37.5-25 MG: 37.5-25 | 30 days supply | Qty: 30 | Fill #1

## 2019-04-02 MED FILL — OZEMPIC 0.25 OR 0.5 MG/DOSE: 2 | 28 days supply | Qty: 2 | Fill #0

## 2019-04-14 MED FILL — glipiZIDE 5 MG TABS: 5 | 30 days supply | Qty: 90 | Fill #2

## 2019-04-18 MED FILL — LISINOPRIL 30 MG TABLET: 30 | 90 days supply | Qty: 90 | Fill #1

## 2019-04-25 MED FILL — OZEMPIC 0.25 OR 0.5 MG/DOSE: 2 | 28 days supply | Qty: 2 | Fill #1

## 2019-05-04 DIAGNOSIS — Z01812 Encounter for preprocedural laboratory examination: Secondary | ICD-10-CM | POA: Diagnosis not present

## 2019-05-08 DIAGNOSIS — D126 Benign neoplasm of colon, unspecified: Secondary | ICD-10-CM | POA: Diagnosis not present

## 2019-05-08 DIAGNOSIS — Z8371 Family history of colonic polyps: Secondary | ICD-10-CM | POA: Diagnosis not present

## 2019-05-08 DIAGNOSIS — Z9889 Other specified postprocedural states: Secondary | ICD-10-CM | POA: Diagnosis not present

## 2019-05-08 DIAGNOSIS — I10 Essential (primary) hypertension: Secondary | ICD-10-CM | POA: Diagnosis not present

## 2019-05-08 DIAGNOSIS — D128 Benign neoplasm of rectum: Secondary | ICD-10-CM | POA: Diagnosis not present

## 2019-05-08 DIAGNOSIS — Z86010 Personal history of colonic polyps: Secondary | ICD-10-CM | POA: Diagnosis not present

## 2019-05-08 DIAGNOSIS — Z1211 Encounter for screening for malignant neoplasm of colon: Secondary | ICD-10-CM | POA: Diagnosis not present

## 2019-05-08 DIAGNOSIS — K640 First degree hemorrhoids: Secondary | ICD-10-CM | POA: Diagnosis not present

## 2019-05-08 DIAGNOSIS — E119 Type 2 diabetes mellitus without complications: Secondary | ICD-10-CM | POA: Diagnosis not present

## 2019-05-09 ENCOUNTER — Other Ambulatory Visit: Payer: Self-pay

## 2019-05-09 ENCOUNTER — Encounter: Payer: Self-pay | Admitting: Internal Medicine

## 2019-05-09 ENCOUNTER — Ambulatory Visit: Payer: 59 | Admitting: Internal Medicine

## 2019-05-09 VITALS — BP 128/80 | HR 107 | Temp 98.5°F | Ht 63.0 in | Wt 203.4 lb

## 2019-05-09 DIAGNOSIS — E1165 Type 2 diabetes mellitus with hyperglycemia: Secondary | ICD-10-CM | POA: Diagnosis not present

## 2019-05-09 LAB — MICROALBUMIN / CREATININE URINE RATIO
Creatinine,U: 67.7 mg/dL
Microalb Creat Ratio: 1 mg/g (ref 0.0–30.0)
Microalb, Ur: <0.7 mg/dL (ref 0.0–1.9)

## 2019-05-09 LAB — POCT GLYCOSYLATED HEMOGLOBIN (HGB A1C): Hemoglobin A1C: 7.5 % — AB (ref 4.0–5.6)

## 2019-05-09 MED ORDER — GLIPIZIDE 5 MG PO TABS: 5.0000 mg | ORAL_TABLET | Freq: Two times a day (BID) | ORAL | 3 refills | Status: DC

## 2019-05-09 MED ORDER — OZEMPIC (1 MG/DOSE) 2 MG/1.5ML ~~LOC~~ SOPN: 1.0000 mg | PEN_INJECTOR | SUBCUTANEOUS | 3 refills | Status: DC

## 2019-05-09 MED FILL — glipiZIDE 5 MG TABS: 5 | 90 days supply | Qty: 180 | Fill #0

## 2019-05-09 MED FILL — OZEMPIC 1 MG/DOSE SOPN: 2 | 28 days supply | Qty: 3 | Fill #0

## 2019-05-09 NOTE — Progress Notes (Signed)
Name: Laura Marsh  Age/ Sex: 64 y.o., female   MRN/ DOB: [id number], [birth year]     PCP: Billie Ruddy, MD   Reason for Endocrinology Evaluation: Type 2 Diabetes Mellitus     Initial Endocrinology Clinic Visit: 06/16/2018    PATIENT IDENTIFIER: Laura Marsh is a 64 y.o. female with a past medical history of HTN, T2DM and Dyslipidemia. The patient has followed with Endocrinology clinic since 06/16/2018 for consultative assistance with management of her diabetes.  DIABETIC HISTORY:  Ms. Kube was diagnosed with T2DM ~ 2014. She has been on oral glycemic agents for years. She has never been on insulin therapy. Her hemoglobin A1c has ranged from 6.5% in 2014, peaking at 11.0% in 2019.   On her initial visit to our clinic she was on Metformin, Glipizide and Januvia, with an A1c 9.7 %.  We stopped Januvia and glipizide and started Ozempic with continuation of Metformin    [facility]  SUBJECTIVE:   During the last visit (01/05/2019): A1c 6.7% . We continued Metformin, and Ozempic we increased glipizide  Today (05/10/2019): [name] is here for a 4 month diabetes follow up.  She checks her blood sugars 2 times daily. The patient has not had hypoglycemic episodes since the last clinic visit.  She has not been able to increase the glipizide to 2 tablets at suppertime due to constipation. otherwise, the patient has not required any recent emergency interventions for hypoglycemia and has not had recent hospitalizations secondary to hyper or hypoglycemic episodes.   She is doing well with Ozempic without nausea or vomiting.    ROS: As per HPI and as detailed below: Review of Systems  Constitutional: Negative for fever.  HENT: Negative for congestion and sore throat.   Respiratory: Negative for cough and shortness of breath.   Cardiovascular: Negative for chest pain and palpitations.  Gastrointestinal: Negative for diarrhea and nausea.    Genitourinary: Negative for frequency.  Endo/Heme/Allergies: Negative for polydipsia.      HOME DIABETES REGIMEN:   Metformin 1000 mg Once daily  Glipizide 5 mg BID with meals  Ozempic to 0.5 mg weekly   METER DOWNLOAD SUMMARY: 1/5-05/09/2019 Fingerstick Blood Glucose Tests = 27 Average Number Tests/Day =0.9 Overall Mean FS Glucose = 159 Standard Deviation = 35.1  BG Ranges: Low = 100 High = 242   Hypoglycemic Events/30 Days: BG < 50 = 0 Episodes of symptomatic severe hypoglycemia = 0        HISTORY:  Past Medical History:  Past Medical History:  Diagnosis Date  . Colon polyp   . Diabetes mellitus   . Elevated cholesterol   . Hypertension    Past Surgical History:  Past Surgical History:  Procedure Laterality Date  . ABDOMINAL HYSTERECTOMY    . COLONOSCOPY    . COLONOSCOPY WITH PROPOFOL N/A 11/08/2017   Procedure: COLONOSCOPY WITH PROPOFOL;  Surgeon: Toledo, Benay Pike, MD;  Location: [facility] ENDOSCOPY;  Service: Gastroenterology;  Laterality: N/A;    Social History:  reports that she has never smoked. She has never used smokeless tobacco. She reports current alcohol use. She reports that she does not use drugs. Family History:  Family History  Problem Relation Age of Onset  . Diabetes Mother   . Congestive Heart Failure Mother      HOME MEDICATIONS: Allergies as of 05/09/2019      Reactions   Trulicity [dulaglutide] Itching   Rash/itching at injection site  Medication List       Accurate as of May 09, 2019 11:59 PM. If you have any questions, ask your nurse or doctor.        STOP taking these medications   Ozempic (0.25 or 0.5 MG/DOSE) 2 MG/1.5ML Sopn Generic drug: Semaglutide(0.25 or 0.'5MG'$ /DOS) Replaced by: Ozempic (1 MG/DOSE) 2 MG/1.5ML Sopn Stopped by: Dorita Sciara, MD     TAKE these medications   Accu-Chek Guide w/Device Kit by Does not apply route.   Alcohol Swabs Pads Use as directed.   amLODipine 5 MG  tablet Commonly known as: NORVASC Take 1 tablet (5 mg total) by mouth daily.   atorvastatin 20 MG tablet Commonly known as: LIPITOR TAKE 1 TABLET BY MOUTH DAILY.   fluticasone 50 MCG/ACT nasal spray Commonly known as: FLONASE Place 2 sprays into both nostrils daily.   glipiZIDE 5 MG tablet Commonly known as: Glucotrol Take 1 tablet (5 mg total) by mouth 2 (two) times daily before a meal. What changed: See the new instructions. Changed by: Dorita Sciara, MD   lisinopril 30 MG tablet Commonly known as: ZESTRIL TAKE 1 TABLET BY MOUTH ONCE DAILY   loratadine 10 MG tablet Commonly known as: CLARITIN Take 10 mg by mouth daily.   metFORMIN 1000 MG tablet Commonly known as: GLUCOPHAGE Take 1 tablet (1,000 mg total) by mouth daily with breakfast.   multivitamin with minerals Tabs tablet Take 1 tablet by mouth daily.   Ozempic (1 MG/DOSE) 2 MG/1.5ML Sopn Generic drug: Semaglutide (1 MG/DOSE) Inject 1 mg into the skin once a week. Replaces: Ozempic (0.25 or 0.5 MG/DOSE) 2 MG/1.5ML Sopn Started by: Dorita Sciara, MD   triamcinolone ointment 0.1 % Commonly known as: KENALOG   triamterene-hydrochlorothiazide 37.5-25 MG tablet Commonly known as: MAXZIDE-25 TAKE 1 TABLET BY MOUTH ONCE DAILY (NEED APPT)     PHYSICAL EXAM: VS: BP 128/80 (BP Location: Right Arm, Patient Position: Sitting, Cuff Size: Large)   Pulse (!) 107   Temp 98.5 F (36.9 C)   Ht '5\' 3"'$  (1.6 m)   Wt 203 lb 6.4 oz (92.3 kg)   SpO2 98%   BMI 36.03 kg/m    EXAM: General: Pt appears well and is in NAD  Lungs: Clear with good BS bilat with no rales, rhonchi, or wheezes  Heart: Auscultation: RRR with normal S1 and S2, no gallops or murmurs Periph. circulation: no peripheral edema  Abdomen: Normoactive bowel sounds, soft, nontender, without masses or organomegaly palpable  Mental Status: Judgment, insight: intact Orientation: oriented to time, place, and person Mood and affect: no depression,  anxiety, or agitation    DM Foot exam 05/09/2019 The skin of the feet is intact without sores or ulcerations. The pedal pulses are 2+ on right and 2+ on left. The sensation is intact to a screening 5.07, 10 gram monofilament bilaterally    DATA REVIEWED:  Lab Results  Component Value Date   HGBA1C 7.5 (A) 05/09/2019   HGBA1C 6.7 (A) 01/05/2019   HGBA1C 6.9 (A) 09/29/2018   Lab Results  Component Value Date   MICROALBUR <0.7 05/09/2019   LDLCALC 84 12/06/2018   CREATININE 0.80 12/06/2018   Lab Results  Component Value Date   MICRALBCREAT 1.0 05/09/2019     Lab Results  Component Value Date   CHOL 155 12/06/2018   HDL 52.50 12/06/2018   LDLCALC 84 12/06/2018   LDLDIRECT 140.3 02/19/2013   TRIG 95.0 12/06/2018   CHOLHDL 3 12/06/2018  ASSESSMENT / PLAN / RECOMMENDATIONS:   1) Type 2 Diabetes Mellitus, Sub-optimally controlled, Without complications - Most recent A1c of 7.5 %. Goal A1c < 7.0 %.     - Mild worsening of hypoglycemia, most likely this is due to dietary indiscretions over the holidays. -We have discussed options of increasing glipizide versus increasing the Ozempic, patient opted to try increasing the Ozempic, she was encouraged to contact us with any GI side effects. -She was advised to increase the Metformin on her last visit but she developed constipation that she attributed to the increased dose of glipizide and would rather not try to go up again on the dose.    MEDICATIONS:  Metformin 1000 mg Once daily  Glipizide 5 mg, 1 tab twice daily  Increase Ozempic 1 mg weekly  EDUCATION / INSTRUCTIONS:  BG monitoring instructions: Patient is instructed to check her blood sugars 2 times a day, fasting and bedtime.  Call [facility] clinic if: BG persistently < 70 or > 300. . I reviewed the Rule of 15 for the treatment of hypoglycemia in detail with the patient. Literature supplied.  2) Diabetic complications:   Eye: Does not have  known diabetic retinopathy.   Neuro/ Feet: Does not have known diabetic peripheral neuropathy.  Renal: Patient does not have known baseline CKD. She is  on an ACEI/ARB at present.  Micro albumin urea testing is normal and up-to-date      F/U in 3 months    Signed electronically by: Mack Guise, MD  [facility]  [facility] [address] [place], [place] [address] Phone: [phone] FAX: [phone]   CC: Billie Ruddy, [place] [address] Phone: [phone]  Fax: [phone]  Return to Endocrinology clinic as below: Future Appointments  Date Time Provider [place]  09/07/2019  3:00 PM Agusta Hackenberg, Melanie Crazier, MD [facility] None

## 2019-05-09 NOTE — Patient Instructions (Addendum)
-   Glipizide 5 mg, 1 tablet before Breakfast and Supper - Continue Metformin 1000 mg daily  - Increase  Ozempic to 1 mg weekly     HOW TO TREAT LOW BLOOD SUGARS (Blood sugar LESS THAN 70 MG/DL)  Please follow the RULE OF 15 for the treatment of hypoglycemia treatment (when your (blood sugars are less than 70 mg/dL)    STEP 1: Take 15 grams of carbohydrates when your blood sugar is low, which includes:   3-4 GLUCOSE TABS  OR  3-4 OZ OF JUICE OR REGULAR SODA OR  ONE TUBE OF GLUCOSE GEL     STEP 2: RECHECK blood sugar in 15 MINUTES STEP 3: If your blood sugar is still low at the 15 minute recheck --> then, go back to STEP 1 and treat AGAIN with another 15 grams of carbohydrates.

## 2019-05-10 MED FILL — TRIAMTERENE-HCTZ 37.5-25 MG: 37.5-25 | 30 days supply | Qty: 30 | Fill #2

## 2019-05-11 ENCOUNTER — Ambulatory Visit: Payer: 59 | Admitting: Internal Medicine

## 2019-05-23 MED FILL — metFORMIN HCL 1000 MG TABS: 1000 | 90 days supply | Qty: 90 | Fill #3

## 2019-06-06 ENCOUNTER — Other Ambulatory Visit: Payer: Self-pay | Admitting: Family Medicine

## 2019-06-06 MED FILL — AMLODIPINE BESYLATE 5 MG TA: 5 | 90 days supply | Qty: 90 | Fill #0

## 2019-06-12 MED FILL — TRIAMTERENE-HCTZ 37.5-25 MG: 37.5-25 | 30 days supply | Qty: 30 | Fill #3

## 2019-06-12 MED FILL — OZEMPIC 1 MG/DOSE SOPN: 2 | 28 days supply | Qty: 3 | Fill #1

## 2019-07-12 MED FILL — OZEMPIC 1 MG/DOSE SOPN: 2 | 28 days supply | Qty: 3 | Fill #2

## 2019-07-24 ENCOUNTER — Other Ambulatory Visit: Payer: Self-pay | Admitting: Family Medicine

## 2019-07-24 MED FILL — LISINOPRIL 30 MG TABLET: 30 | 90 days supply | Qty: 90 | Fill #0

## 2019-07-24 MED FILL — TRIAMTERENE-HCTZ 37.5-25 MG: 37.5-25 | 30 days supply | Qty: 30 | Fill #4

## 2019-07-24 MED FILL — ATORVASTATIN 20 MG TABLET: 20 | 90 days supply | Qty: 90 | Fill #2

## 2019-08-14 ENCOUNTER — Other Ambulatory Visit: Payer: Self-pay | Admitting: Internal Medicine

## 2019-08-31 ENCOUNTER — Other Ambulatory Visit: Payer: Self-pay | Admitting: Family Medicine

## 2019-08-31 MED FILL — AMLODIPINE BESYLATE 5 MG TA: 5 | 90 days supply | Qty: 90 | Fill #0

## 2019-08-31 MED FILL — TRIAMTERENE-HCTZ 37.5-25 MG: 37.5-25 | 30 days supply | Qty: 30 | Fill #5

## 2019-08-31 NOTE — Telephone Encounter (Signed)
Pt is calling in stating that she is out of the following Rx amlodipine 5MG    Pharm:  [facility]

## 2019-09-05 ENCOUNTER — Telehealth: Payer: Self-pay

## 2019-09-05 ENCOUNTER — Other Ambulatory Visit (HOSPITAL_COMMUNITY): Payer: Self-pay | Admitting: Internal Medicine

## 2019-09-05 MED ORDER — GLIPIZIDE 5 MG PO TABS: 5.0000 mg | ORAL_TABLET | Freq: Two times a day (BID) | ORAL | 3 refills | Status: DC

## 2019-09-05 MED ORDER — OZEMPIC (1 MG/DOSE) 2 MG/1.5ML ~~LOC~~ SOPN: 1.0000 mg | PEN_INJECTOR | SUBCUTANEOUS | 3 refills | Status: DC

## 2019-09-05 MED ORDER — METFORMIN HCL 1000 MG PO TABS: 1000.0000 mg | ORAL_TABLET | Freq: Every day | ORAL | 3 refills | Status: DC

## 2019-09-05 NOTE — Telephone Encounter (Signed)
Patient called and changed appointment to October 05, 2019 from 09-07-19 and she needs refills on the following medication.  She states that she could not get off work.     Ozempic, Metformin, Glipizide

## 2019-09-05 NOTE — Addendum Note (Signed)
Addended by: Dorita Sciara on: 09/05/2019 04:53 PM   Modules accepted: Orders

## 2019-09-06 MED FILL — OZEMPIC 1 MG/DOSE SOPN: 2 | 28 days supply | Qty: 3 | Fill #0

## 2019-09-07 ENCOUNTER — Ambulatory Visit: Payer: 59 | Admitting: Internal Medicine

## 2019-10-03 ENCOUNTER — Other Ambulatory Visit: Payer: Self-pay | Admitting: Family Medicine

## 2019-10-03 MED FILL — TRIAMTERENE-HCTZ 37.5-25 MG: 37.5-25 | 30 days supply | Qty: 30 | Fill #0

## 2019-10-05 ENCOUNTER — Ambulatory Visit: Payer: 59 | Admitting: Internal Medicine

## 2019-10-10 MED FILL — OZEMPIC (1 MG/DOSE) 4 MG/3M: 4 | 28 days supply | Qty: 3 | Fill #0

## 2019-10-23 ENCOUNTER — Other Ambulatory Visit: Payer: Self-pay | Admitting: Family Medicine

## 2019-10-23 MED FILL — LISINOPRIL 30 MG TABLET: 30 | 90 days supply | Qty: 90 | Fill #0

## 2019-11-06 ENCOUNTER — Other Ambulatory Visit: Payer: Self-pay

## 2019-11-06 ENCOUNTER — Ambulatory Visit: Payer: 59 | Admitting: Internal Medicine

## 2019-11-06 ENCOUNTER — Other Ambulatory Visit: Payer: Self-pay | Admitting: Internal Medicine

## 2019-11-06 VITALS — BP 144/94 | HR 85 | Ht 63.0 in | Wt 206.2 lb

## 2019-11-06 DIAGNOSIS — Z1231 Encounter for screening mammogram for malignant neoplasm of breast: Secondary | ICD-10-CM | POA: Diagnosis not present

## 2019-11-06 DIAGNOSIS — Z01419 Encounter for gynecological examination (general) (routine) without abnormal findings: Secondary | ICD-10-CM | POA: Diagnosis not present

## 2019-11-06 DIAGNOSIS — H524 Presbyopia: Secondary | ICD-10-CM | POA: Diagnosis not present

## 2019-11-06 DIAGNOSIS — H52223 Regular astigmatism, bilateral: Secondary | ICD-10-CM | POA: Diagnosis not present

## 2019-11-06 DIAGNOSIS — Z6836 Body mass index (BMI) 36.0-36.9, adult: Secondary | ICD-10-CM | POA: Diagnosis not present

## 2019-11-06 DIAGNOSIS — E1169 Type 2 diabetes mellitus with other specified complication: Secondary | ICD-10-CM

## 2019-11-06 LAB — POCT GLYCOSYLATED HEMOGLOBIN (HGB A1C): Hemoglobin A1C: 9 % — AB (ref 4.0–5.6)

## 2019-11-06 LAB — HM MAMMOGRAPHY

## 2019-11-06 MED ORDER — TRULICITY 1.5 MG/0.5ML ~~LOC~~ SOAJ
1.5000 mg | SUBCUTANEOUS | 6 refills | Status: DC
Start: 2019-11-06 — End: 2020-06-20

## 2019-11-06 MED ORDER — GLIPIZIDE 10 MG PO TABS: 10.0000 mg | ORAL_TABLET | Freq: Two times a day (BID) | ORAL | 3 refills | Status: DC

## 2019-11-06 MED FILL — TRULICITY 1.5 MG/0.5 ML PEN: 1.5 | 28 days supply | Qty: 2 | Fill #0

## 2019-11-06 MED FILL — glipiZIDE 10 MG TABS: 10 | 90 days supply | Qty: 180 | Fill #0

## 2019-11-06 NOTE — Patient Instructions (Addendum)
-   Glipizide 10 mg, 1 tablet before Breakfast and Supper - Continue Metformin 1000 mg daily  - Stop  Ozempic  - Start Trulicity 1.5 mg weekly     HOW TO TREAT LOW BLOOD SUGARS (Blood sugar LESS THAN 70 MG/DL)  Please follow the RULE OF 15 for the treatment of hypoglycemia treatment (when your (blood sugars are less than 70 mg/dL)    STEP 1: Take 15 grams of carbohydrates when your blood sugar is low, which includes:   3-4 GLUCOSE TABS  OR  3-4 OZ OF JUICE OR REGULAR SODA OR  ONE TUBE OF GLUCOSE GEL     STEP 2: RECHECK blood sugar in 15 MINUTES STEP 3: If your blood sugar is still low at the 15 minute recheck --> then, go back to STEP 1 and treat AGAIN with another 15 grams of carbohydrates.

## 2019-11-06 NOTE — Progress Notes (Signed)
Name: Laura Marsh  Age/ Sex: 64 y.o., female   MRN/ DOB: [id number], 11-Nov-1955     PCP: Billie Ruddy, MD   Reason for Endocrinology Evaluation: Type 2 Diabetes Mellitus     Initial Endocrinology Clinic Visit: 06/16/2018    PATIENT IDENTIFIER: Laura Marsh is a 64 y.o. female with a past medical history of HTN, T2DM and Dyslipidemia. The patient has followed with Endocrinology clinic since 06/16/2018 for consultative assistance with management of her diabetes.  DIABETIC HISTORY:  Laura Marsh was diagnosed with T2DM ~ 2014. She has been on oral glycemic agents for years. She has never been on insulin therapy. Her hemoglobin A1c has ranged from 6.5% in 2014, peaking at 11.0% in 2019.   On her initial visit to our clinic she was on Metformin, Glipizide and Januvia, with an A1c 9.7 %.  We stopped Januvia and glipizide and started Ozempic with continuation of Metformin    [facility]  SUBJECTIVE:   During the last visit (05/09/2019): A1c 7.5 % . We continued Metformin, and glipizide, and increased Ozempic   Today (11/06/2019): Laura Marsh is here for a month diabetes follow up.  She checks her blood sugars occasionally  times daily. The patient has not had hypoglycemic episodes since the last clinic visit.   Started exercising 2x a week    HOME DIABETES REGIMEN:   Metformin 1000 mg Once daily  Glipizide 5 mg BID with meals  Ozempic 1 mg weekly   METER DOWNLOAD SUMMARY: 7/5-11/06/2019 Fingerstick Blood Glucose Tests = 11 Average Number Tests/Day =0.4 Overall Mean FS Glucose = 139   BG Ranges: Low = 97 High = 178   Hypoglycemic Events/30 Days: BG < 50 = 0 Episodes of symptomatic severe hypoglycemia = 0     HISTORY:  Past Medical History:  Past Medical History:  Diagnosis Date  . Colon polyp   . Diabetes mellitus   . Elevated cholesterol   . Hypertension    Past Surgical History:  Past Surgical History:   Procedure Laterality Date  . ABDOMINAL HYSTERECTOMY    . COLONOSCOPY    . COLONOSCOPY WITH PROPOFOL N/A 11/08/2017   Procedure: COLONOSCOPY WITH PROPOFOL;  Surgeon: Toledo, Benay Pike, MD;  Location: [facility] ENDOSCOPY;  Service: Gastroenterology;  Laterality: N/A;    Social History:  reports that she has never smoked. She has never used smokeless tobacco. She reports current alcohol use. She reports that she does not use drugs. Family History:  Family History  Problem Relation Age of Onset  . Diabetes Mother   . Congestive Heart Failure Mother      HOME MEDICATIONS: Allergies as of 11/06/2019      Reactions   Trulicity [dulaglutide] Itching   Rash/itching at injection site      Medication List       Accurate as of November 06, 2019  2:48 PM. If you have any questions, ask your nurse or doctor.        Accu-Chek Guide w/Device Kit by Does not apply route.   Alcohol Swabs Pads Use as directed.   amLODipine 5 MG tablet Commonly known as: NORVASC TAKE 1 TABLET (5 MG TOTAL) BY MOUTH DAILY.   atorvastatin 20 MG tablet Commonly known as: LIPITOR TAKE 1 TABLET BY MOUTH DAILY.   fluticasone 50 MCG/ACT nasal spray Commonly known as: FLONASE Place 2 sprays into both nostrils daily.   glipiZIDE 5 MG tablet  Commonly known as: Glucotrol Take 1 tablet (5 mg total) by mouth 2 (two) times daily before a meal.   lisinopril 30 MG tablet Commonly known as: ZESTRIL TAKE 1 TABLET BY MOUTH ONCE DAILY **NEEDS OFFICE VISIT FOR FURTHER REFILLS**   loratadine 10 MG tablet Commonly known as: CLARITIN Take 10 mg by mouth daily.   metFORMIN 1000 MG tablet Commonly known as: GLUCOPHAGE Take 1 tablet (1,000 mg total) by mouth daily with breakfast.   multivitamin with minerals Tabs tablet Take 1 tablet by mouth daily.   Ozempic (1 MG/DOSE) 2 MG/1.5ML Sopn Generic drug: Semaglutide (1 MG/DOSE) Inject 1 mg into the skin once a week.   triamcinolone ointment 0.1 % Commonly known as:  KENALOG   triamterene-hydrochlorothiazide 37.5-25 MG tablet Commonly known as: MAXZIDE-25 TAKE 1 TABLET BY MOUTH ONCE DAILY **NEEDS APPOINTMENT FOR FURTHER REFILLS**     PHYSICAL EXAM: VS: BP (!) 144/94 (BP Location: Right Arm, Patient Position: Sitting, Cuff Size: Normal)   Pulse 85   Ht '5\' 3"'$  (1.6 m)   Wt 206 lb 3.2 oz (93.5 kg)   SpO2 95%   BMI 36.53 kg/m    EXAM: General: Pt appears well and is in NAD  Lungs: Clear with good BS bilat with no rales, rhonchi, or wheezes  Heart: Auscultation: RRR with normal S1 and S2, no gallops or murmurs Periph. circulation: no peripheral edema  Abdomen: Normoactive bowel sounds, soft, nontender, without masses or organomegaly palpable  Mental Status: Judgment, insight: intact Orientation: oriented to time, place, and person Mood and affect: no depression, anxiety, or agitation    DM Foot exam 05/09/2019 The skin of the feet is intact without sores or ulcerations. The pedal pulses are 2+ on right and 2+ on left. The sensation is intact to a screening 5.07, 10 gram monofilament bilaterally    DATA REVIEWED:  Lab Results  Component Value Date   HGBA1C 9.0 (A) 11/06/2019   HGBA1C 7.5 (A) 05/09/2019   HGBA1C 6.7 (A) 01/05/2019   Lab Results  Component Value Date   MICROALBUR <0.7 05/09/2019   LDLCALC 84 12/06/2018   CREATININE 0.80 12/06/2018   Lab Results  Component Value Date   MICRALBCREAT 1.0 05/09/2019     Lab Results  Component Value Date   CHOL 155 12/06/2018   HDL 52.50 12/06/2018   LDLCALC 84 12/06/2018   LDLDIRECT 140.3 02/19/2013   TRIG 95.0 12/06/2018   CHOLHDL 3 12/06/2018         ASSESSMENT / PLAN / RECOMMENDATIONS:   1) Type 2 Diabetes Mellitus, Poorly controlled, Without complications - Most recent A1c of 9.0 %. Goal A1c < 7.0 %.     - Poorly controlled diabetes, pt would like to switch Ozempic to trulicity despite reported injection site reaction as she believes it has worked for her in the past.   - We discussed increasing Glipizide, vs switching to basal insulin  - She is intolerant to higher doses of metformin - We also discussed SGLT-2 inhibitors with their risk and benefits - Declined RD referral  - Will make the following changes    MEDICATIONS:  Continue Metformin 1000 mg Once daily  Increase Glipizide 10 mg, twice daily  Stop Ozempic   Restart Trulicity 1.5 mg weekly      EDUCATION / INSTRUCTIONS:  BG monitoring instructions: Patient is instructed to check her blood sugars 2 times a day, fasting and bedtime.  Call [facility] if: BG persistently < 70 or > 300. Marland Kitchen  I reviewed the Rule of 15 for the treatment of hypoglycemia in detail with the patient. Literature supplied.  2) Diabetic complications:   Eye: Does not have known diabetic retinopathy.   Neuro/ Feet: Does not have known diabetic peripheral neuropathy.  Renal: Patient does not have known baseline CKD. She is  on an ACEI/ARB at present.  Micro albumin urea testing is normal and up-to-date      F/U in 3 months    Signed electronically by: Mack Guise, MD  [facility]  [facility] [address] [place], [place] [address] Phone: [phone] FAX: [phone]   CC: Billie Ruddy, [place] [address] Phone: [phone]  Fax: [phone]  Return to Endocrinology clinic as below: Future Appointments  Date Time Provider [place]  12/07/2019  9:30 AM Billie Ruddy, MD [id number]

## 2019-11-12 ENCOUNTER — Encounter: Payer: Self-pay | Admitting: Family Medicine

## 2019-11-14 ENCOUNTER — Other Ambulatory Visit: Payer: Self-pay | Admitting: Family Medicine

## 2019-11-14 MED FILL — METFORMIN HCL 1000 MG TABS: 1000 | 90 days supply | Qty: 90 | Fill #0

## 2019-11-14 MED FILL — OZEMPIC (1 MG/DOSE) 4 MG/3M: 4 | 28 days supply | Qty: 3 | Fill #1

## 2019-11-14 MED FILL — TRIAMTERENE-HCTZ 37.5-25 MG: 37.5-25 | 30 days supply | Qty: 30 | Fill #0

## 2019-11-14 NOTE — Telephone Encounter (Signed)
Pt has an appointment scheduled for 12/07/2019 with Dr Volanda Napoleon. One month worth refill was sent to pt pharmacy

## 2019-11-20 ENCOUNTER — Other Ambulatory Visit: Payer: Self-pay | Admitting: Family Medicine

## 2019-11-20 MED FILL — AMLODIPINE BESYLATE 5 MG TA: 5 | 90 days supply | Qty: 90 | Fill #0

## 2019-11-20 MED FILL — ATORVASTATIN CALCIUM 20 MG: 20 | 90 days supply | Qty: 90 | Fill #3

## 2019-12-07 ENCOUNTER — Ambulatory Visit (INDEPENDENT_AMBULATORY_CARE_PROVIDER_SITE_OTHER): Payer: 59 | Admitting: Family Medicine

## 2019-12-07 ENCOUNTER — Encounter: Payer: Self-pay | Admitting: Family Medicine

## 2019-12-07 ENCOUNTER — Other Ambulatory Visit: Payer: Self-pay

## 2019-12-07 ENCOUNTER — Other Ambulatory Visit: Payer: Self-pay | Admitting: Family Medicine

## 2019-12-07 VITALS — BP 118/84 | HR 93 | Temp 98.3°F | Ht 63.0 in | Wt 207.0 lb

## 2019-12-07 DIAGNOSIS — Z0000 Encounter for general adult medical examination without abnormal findings: Secondary | ICD-10-CM

## 2019-12-07 DIAGNOSIS — E1169 Type 2 diabetes mellitus with other specified complication: Secondary | ICD-10-CM

## 2019-12-07 DIAGNOSIS — E785 Hyperlipidemia, unspecified: Secondary | ICD-10-CM

## 2019-12-07 DIAGNOSIS — I10 Essential (primary) hypertension: Secondary | ICD-10-CM | POA: Diagnosis not present

## 2019-12-07 MED ORDER — TRIAMTERENE-HCTZ 37.5-25 MG PO TABS: ORAL_TABLET | ORAL | 3 refills | Status: DC

## 2019-12-07 MED ORDER — LISINOPRIL 30 MG PO TABS: ORAL_TABLET | ORAL | 3 refills | Status: DC

## 2019-12-07 NOTE — Patient Instructions (Signed)
Preventive Care 40-62 Years Old, Female °Preventive care refers to visits with your health care provider and lifestyle choices that can promote health and wellness. This includes: °· A yearly physical exam. This may also be called an annual well check. °· Regular dental visits and eye exams. °· Immunizations. °· Screening for certain conditions. °· Healthy lifestyle choices, such as eating a healthy diet, getting regular exercise, not using drugs or products that contain nicotine and tobacco, and limiting alcohol use. °What can I expect for my preventive care visit? °Physical exam °Your health care provider will check your: °· Height and weight. This may be used to calculate body mass index (BMI), which tells if you are at a healthy weight. °· Heart rate and blood pressure. °· Skin for abnormal spots. °Counseling °Your health care provider may ask you questions about your: °· Alcohol, tobacco, and drug use. °· Emotional well-being. °· Home and relationship well-being. °· Sexual activity. °· Eating habits. °· Work and work environment. °· Method of birth control. °· Menstrual cycle. °· Pregnancy history. °What immunizations do I need? ° °Influenza (flu) vaccine °· This is recommended every year. °Tetanus, diphtheria, and pertussis (Tdap) vaccine °· You may need a Td booster every 10 years. °Varicella (chickenpox) vaccine °· You may need this if you have not been vaccinated. °Zoster (shingles) vaccine °· You may need this after age 60. °Measles, mumps, and rubella (MMR) vaccine °· You may need at least one dose of MMR if you were born in 1957 or later. You may also need a second dose. °Pneumococcal conjugate (PCV13) vaccine °· You may need this if you have certain conditions and were not previously vaccinated. °Pneumococcal polysaccharide (PPSV23) vaccine °· You may need one or two doses if you smoke cigarettes or if you have certain conditions. °Meningococcal conjugate (MenACWY) vaccine °· You may need this if you  have certain conditions. °Hepatitis A vaccine °· You may need this if you have certain conditions or if you travel or work in places where you may be exposed to hepatitis A. °Hepatitis B vaccine °· You may need this if you have certain conditions or if you travel or work in places where you may be exposed to hepatitis B. °Haemophilus influenzae type b (Hib) vaccine °· You may need this if you have certain conditions. °Human papillomavirus (HPV) vaccine °· If recommended by your health care provider, you may need three doses over 6 months. °You may receive vaccines as individual doses or as more than one vaccine together in one shot (combination vaccines). Talk with your health care provider about the risks and benefits of combination vaccines. °What tests do I need? °Blood tests °· Lipid and cholesterol levels. These may be checked every 5 years, or more frequently if you are over 50 years old. °· Hepatitis C test. °· Hepatitis B test. °Screening °· Lung cancer screening. You may have this screening every year starting at age 55 if you have a 30-pack-year history of smoking and currently smoke or have quit within the past 15 years. °· Colorectal cancer screening. All adults should have this screening starting at age 50 and continuing until age 75. Your health care provider may recommend screening at age 45 if you are at increased risk. You will have tests every 1-10 years, depending on your results and the type of screening test. °· Diabetes screening. This is done by checking your blood sugar (glucose) after you have not eaten for a while (fasting). You may have this   done every 1-3 years.  Mammogram. This may be done every 1-2 years. Talk with your health care provider about when you should start having regular mammograms. This may depend on whether you have a family history of breast cancer.  BRCA-related cancer screening. This may be done if you have a family history of breast, ovarian, tubal, or peritoneal  cancers.  Pelvic exam and Pap test. This may be done every 3 years starting at age 58. Starting at age 36, this may be done every 5 years if you have a Pap test in combination with an HPV test. Other tests  Sexually transmitted disease (STD) testing.  Bone density scan. This is done to screen for osteoporosis. You may have this scan if you are at high risk for osteoporosis. Follow these instructions at home: Eating and drinking  Eat a diet that includes fresh fruits and vegetables, whole grains, lean protein, and low-fat dairy.  Take vitamin and mineral supplements as recommended by your health care provider.  Do not drink alcohol if: ? Your health care provider tells you not to drink. ? You are pregnant, may be pregnant, or are planning to become pregnant.  If you drink alcohol: ? Limit how much you have to 0-1 drink a day. ? Be aware of how much alcohol is in your drink. In the [place] one drink equals one 12 oz bottle of beer (355 mL), one 5 oz glass of wine (148 mL), or one 1 oz glass of hard liquor (44 mL). Lifestyle  Take daily care of your teeth and gums.  Stay active. Exercise for at least 30 minutes on 5 or more days each week.  Do not use any products that contain nicotine or tobacco, such as cigarettes, e-cigarettes, and chewing tobacco. If you need help quitting, ask your health care provider.  If you are sexually active, practice safe sex. Use a condom or other form of birth control (contraception) in order to prevent pregnancy and STIs (sexually transmitted infections).  If told by your health care provider, take low-dose aspirin daily starting at age 36. What's next?  Visit your health care provider once a year for a well check visit.  Ask your health care provider how often you should have your eyes and teeth checked.  Stay up to date on all vaccines. This information is not intended to replace advice given to you by your health care provider. Make sure you  discuss any questions you have with your health care provider. Document Revised: 12/01/2017 Document Reviewed: 12/01/2017 Elsevier Patient Education  Ord Your Hypertension Hypertension is commonly called high blood pressure. This is when the force of your blood pressing against the walls of your arteries is too strong. Arteries are blood vessels that carry blood from your heart throughout your body. Hypertension forces the heart to work harder to pump blood, and may cause the arteries to become narrow or stiff. Having untreated or uncontrolled hypertension can cause heart attack, stroke, kidney disease, and other problems. What are blood pressure readings? A blood pressure reading consists of a higher number over a lower number. Ideally, your blood pressure should be below 120/80. The first ("top") number is called the systolic pressure. It is a measure of the pressure in your arteries as your heart beats. The second ("bottom") number is called the diastolic pressure. It is a measure of the pressure in your arteries as the heart relaxes. What does my blood pressure reading mean? Blood pressure  is classified into four stages. Based on your blood pressure reading, your health care provider may use the following stages to determine what type of treatment you need, if any. Systolic pressure and diastolic pressure are measured in a unit called mm Hg. Normal  Systolic pressure: below 932.  Diastolic pressure: below 80. Elevated  Systolic pressure: 355-732.  Diastolic pressure: below 80. Hypertension stage 1  Systolic pressure: 202-542.  Diastolic pressure: 70-62. Hypertension stage 2  Systolic pressure: 376 or above.  Diastolic pressure: 90 or above. What health risks are associated with hypertension? Managing your hypertension is an important responsibility. Uncontrolled hypertension can lead to:  A heart attack.  A stroke.  A weakened blood vessel  (aneurysm).  Heart failure.  Kidney damage.  Eye damage.  Metabolic syndrome.  Memory and concentration problems. What changes can I make to manage my hypertension? Hypertension can be managed by making lifestyle changes and possibly by taking medicines. Your health care provider will help you make a plan to bring your blood pressure within a normal range. Eating and drinking   Eat a diet that is high in fiber and potassium, and low in salt (sodium), added sugar, and fat. An example eating plan is called the DASH (Dietary Approaches to Stop Hypertension) diet. To eat this way: ? Eat plenty of fresh fruits and vegetables. Try to fill half of your plate at each meal with fruits and vegetables. ? Eat whole grains, such as whole wheat pasta, brown rice, or whole grain bread. Fill about one quarter of your plate with whole grains. ? Eat low-fat diary products. ? Avoid fatty cuts of meat, processed or cured meats, and poultry with skin. Fill about one quarter of your plate with lean proteins such as fish, chicken without skin, beans, eggs, and tofu. ? Avoid premade and processed foods. These tend to be higher in sodium, added sugar, and fat.  Reduce your daily sodium intake. Most people with hypertension should eat less than 1,500 mg of sodium a day.  Limit alcohol intake to no more than 1 drink a day for nonpregnant women and 2 drinks a day for men. One drink equals 12 oz of beer, 5 oz of wine, or 1 oz of hard liquor. Lifestyle  Work with your health care provider to maintain a healthy body weight, or to lose weight. Ask what an ideal weight is for you.  Get at least 30 minutes of exercise that causes your heart to beat faster (aerobic exercise) most days of the week. Activities may include walking, swimming, or biking.  Include exercise to strengthen your muscles (resistance exercise), such as weight lifting, as part of your weekly exercise routine. Try to do these types of exercises for  30 minutes at least 3 days a week.  Do not use any products that contain nicotine or tobacco, such as cigarettes and e-cigarettes. If you need help quitting, ask your health care provider.  Control any long-term (chronic) conditions you have, such as high cholesterol or diabetes. Monitoring  Monitor your blood pressure at home as told by your health care provider. Your personal target blood pressure may vary depending on your medical conditions, your age, and other factors.  Have your blood pressure checked regularly, as often as told by your health care provider. Working with your health care provider  Review all the medicines you take with your health care provider because there may be side effects or interactions.  Talk with your health care provider about your  diet, exercise habits, and other lifestyle factors that may be contributing to hypertension.  Visit your health care provider regularly. Your health care provider can help you create and adjust your plan for managing hypertension. Will I need medicine to control my blood pressure? Your health care provider may prescribe medicine if lifestyle changes are not enough to get your blood pressure under control, and if:  Your systolic blood pressure is 130 or higher.  Your diastolic blood pressure is 80 or higher. Take medicines only as told by your health care provider. Follow the directions carefully. Blood pressure medicines must be taken as prescribed. The medicine does not work as well when you skip doses. Skipping doses also puts you at risk for problems. Contact a health care provider if:  You think you are having a reaction to medicines you have taken.  You have repeated (recurrent) headaches.  You feel dizzy.  You have swelling in your ankles.  You have trouble with your vision. Get help right away if:  You develop a severe headache or confusion.  You have unusual weakness or numbness, or you feel faint.  You have  severe pain in your chest or abdomen.  You vomit repeatedly.  You have trouble breathing. Summary  Hypertension is when the force of blood pumping through your arteries is too strong. If this condition is not controlled, it may put you at risk for serious complications.  Your personal target blood pressure may vary depending on your medical conditions, your age, and other factors. For most people, a normal blood pressure is less than 120/80.  Hypertension is managed by lifestyle changes, medicines, or both. Lifestyle changes include weight loss, eating a healthy, low-sodium diet, exercising more, and limiting alcohol. This information is not intended to replace advice given to you by your health care provider. Make sure you discuss any questions you have with your health care provider. Document Revised: 07/14/2018 Document Reviewed: 02/18/2016 Elsevier Patient Education  2020 [employer], Adult Stress is a normal reaction to life events. Stress is what you feel when life demands more than you are used to, or more than you think you can handle. Some stress can be useful, such as studying for a test or meeting a deadline at work. Stress that occurs too often or for too long can cause problems. It can affect your emotional health and interfere with relationships and normal daily activities. Too much stress can weaken your body's defense system (immune system) and increase your risk for physical illness. If you already have a medical problem, stress can make it worse. What are the causes? All sorts of life events can cause stress. An event that causes stress for one person may not be stressful for another person. Major life events, whether positive or negative, commonly cause stress. Examples include:  Losing a job or starting a new job.  Losing a loved one.  Moving to a new town or home.  Getting married or divorced.  Having a baby.  Getting injured or sick. Less obvious life  events can also cause stress, especially if they occur day after day or in combination with each other. Examples include:  Working long hours.  Driving in traffic.  Caring for children.  Being in debt.  Being in a difficult relationship. What are the signs or symptoms? Stress can cause emotional symptoms, including:  Anxiety. This is feeling worried, afraid, on edge, overwhelmed, or out of control.  Anger, including irritation or impatience.  Depression. This is feeling sad, down, helpless, or guilty.  Trouble focusing, remembering, or making decisions. Stress can cause physical symptoms, including:  Aches and pains. These may affect your head, neck, back, stomach, or other areas of your body.  Tight muscles or a clenched jaw.  Low energy.  Trouble sleeping. Stress can cause unhealthy behaviors, including:  Eating to feel better (overeating) or skipping meals.  Working too much or putting off tasks.  Smoking, drinking alcohol, or using drugs to feel better. How is this diagnosed? Stress is diagnosed through an assessment by your health care provider. He or she may diagnose this condition based on:  Your symptoms and any stressful life events.  Your medical history.  Tests to rule out other causes of your symptoms. Depending on your condition, your health care provider may refer you to a specialist for further evaluation. How is this treated?  Stress management techniques are the recommended treatment for stress. Medicine is not typically recommended for the treatment of stress. Techniques to reduce your reaction to stressful life events include:  Stress identification. Monitor yourself for symptoms of stress and identify what causes stress for you. These skills may help you to avoid or prepare for stressful events.  Time management. Set your priorities, keep a calendar of events, and learn to say no. Taking these actions can help you avoid making too many  commitments. Techniques for coping with stress include:  Rethinking the problem. Try to think realistically about stressful events rather than ignoring them or overreacting. Try to find the positives in a stressful situation rather than focusing on the negatives.  Exercise. Physical exercise can release both physical and emotional tension. The key is to find a form of exercise that you enjoy and do it regularly.  Relaxation techniques. These relax the body and mind. The key is to find one or more that you enjoy and use the techniques regularly. Examples include: ? Meditation, deep breathing, or progressive relaxation techniques. ? Yoga or tai chi. ? Biofeedback, mindfulness techniques, or journaling. ? Listening to music, being out in nature, or participating in other hobbies.  Practicing a healthy lifestyle. Eat a balanced diet, drink plenty of water, limit or avoid caffeine, and get plenty of sleep.  Having a strong support network. Spend time with family, friends, or other people you enjoy being around. Express your feelings and talk things over with someone you trust. Counseling or talk therapy with a mental health professional may be helpful if you are having trouble managing stress on your own. Follow these instructions at home: Lifestyle   Avoid drugs.  Do not use any products that contain nicotine or tobacco, such as cigarettes, e-cigarettes, and chewing tobacco. If you need help quitting, ask your health care provider.  Limit alcohol intake to no more than 1 drink a day for nonpregnant women and 2 drinks a day for men. One drink equals 12 oz of beer, 5 oz of wine, or 1 oz of hard liquor  Do not use alcohol or drugs to relax.  Eat a balanced diet that includes fresh fruits and vegetables, whole grains, lean meats, fish, eggs, and beans, and low-fat dairy. Avoid processed foods and foods high in added fat, sugar, and salt.  Exercise at least 30 minutes on 5 or more days each  week.  Get 7-8 hours of sleep each night. General instructions   Practice stress management techniques as discussed with your health care provider.  Drink enough fluid to keep your  urine clear or pale yellow.  Take over-the-counter and prescription medicines only as told by your health care provider.  Keep all follow-up visits as told by your health care provider. This is important. Contact a health care provider if:  Your symptoms get worse.  You have new symptoms.  You feel overwhelmed by your problems and can no longer manage them on your own. Get help right away if:  You have thoughts of hurting yourself or others. If you ever feel like you may hurt yourself or others, or have thoughts about taking your own life, get help right away. You can go to your nearest emergency department or call:  Your local emergency services (911 in the [place]  A suicide crisis helpline, such as the [facility] at (618) 729-0766. This is open 24 hours a day. Summary  Stress is a normal reaction to life events. It can cause problems if it happens too often or for too long.  Practicing stress management techniques is the best way to treat stress.  Counseling or talk therapy with a mental health professional may be helpful if you are having trouble managing stress on your own. This information is not intended to replace advice given to you by your health care provider. Make sure you discuss any questions you have with your health care provider. Document Revised: 10/20/2018 Document Reviewed: 05/12/2016 Elsevier Patient Education  [place].

## 2019-12-07 NOTE — Progress Notes (Signed)
Subjective:     Laura Marsh is a 64 y.o. female and is here for a comprehensive physical exam.  Pt notes increased stress at work.  She is considering retirement versus finding a new job.  Patient notes hemoglobin A1c was elevated at endocrinology visit last month.  Pt trying to make diet changes.  Was eating breakfast at [facility] a few times per week.  Drinking some water but states to drink more.  Was taking Trulicity however it caused a rash and injection site in stomach and left upper thigh.  Patient notes BP has been good, 120s over 80s at home.  Social History   Socioeconomic History  . Marital status: Divorced    Spouse name: Not on file  . Number of children: Not on file  . Years of education: Not on file  . Highest education level: Not on file  Occupational History  . Not on file  Tobacco Use  . Smoking status: Never Smoker  . Smokeless tobacco: Never Used  Vaping Use  . Vaping Use: Never used  Substance and Sexual Activity  . Alcohol use: Yes    Comment: rareky  . Drug use: No  . Sexual activity: Not on file  Other Topics Concern  . Not on file  Social History Narrative  . Not on file   Social Determinants of Health   Financial Resource Strain:   . Difficulty of Paying Living Expenses: Not on file  Food Insecurity:   . Worried About Charity fundraiser in the Last Year: Not on file  . Ran Out of Food in the Last Year: Not on file  Transportation Needs:   . Lack of Transportation (Medical): Not on file  . Lack of Transportation (Non-Medical): Not on file  Physical Activity:   . Days of Exercise per Week: Not on file  . Minutes of Exercise per Session: Not on file  Stress:   . Feeling of Stress : Not on file  Social Connections:   . Frequency of Communication with Friends and Family: Not on file  . Frequency of Social Gatherings with Friends and Family: Not on file  . Attends Religious Services: Not on file  . Active Member of Clubs or Organizations: Not  on file  . Attends Archivist Meetings: Not on file  . Marital Status: Not on file  Intimate Partner Violence:   . Fear of Current or Ex-Partner: Not on file  . Emotionally Abused: Not on file  . Physically Abused: Not on file  . Sexually Abused: Not on file   Health Maintenance  Topic Date Due  . PNEUMOCOCCAL POLYSACCHARIDE VACCINE AGE 70-64 HIGH RISK  Never done  . COVID-19 Vaccine (1) Never done  . OPHTHALMOLOGY EXAM  06/04/2018  . COLONOSCOPY  12/03/2018  . INFLUENZA VACCINE  11/04/2019  . HIV Screening  06/13/2024 (Originally 12/17/1970)  . PAP SMEAR-Modifier  05/06/2020  . FOOT EXAM  05/08/2020  . HEMOGLOBIN A1C  05/08/2020  . TETANUS/TDAP  09/03/2020  . MAMMOGRAM  11/05/2021  . Hepatitis C Screening  Completed    The following portions of the patient's history were reviewed and updated as appropriate: allergies, current medications, past family history, past medical history, past social history, past surgical history and problem list.  Review of Systems Pertinent items noted in HPI and remainder of comprehensive ROS otherwise negative.   Objective:    BP 118/84 (BP Location: Left Arm, Patient Position: Sitting, Cuff Size: Large)   Pulse  93   Temp 98.3 F (36.8 C) (Oral)   Ht 5\' 3"  (1.6 m)   Wt 207 lb (93.9 kg)   SpO2 97%   BMI 36.67 kg/m  General appearance: alert, cooperative and no distress Head: Normocephalic, without obvious abnormality, atraumatic Eyes: conjunctivae/corneas clear. PERRL, EOM's intact. Fundi benign. Ears: normal TM's and external ear canals both ears Nose: Nares normal. Septum midline. Mucosa normal. No drainage or sinus tenderness. Throat: lips, mucosa, and tongue normal; teeth and gums normal Neck: no adenopathy, no carotid bruit, no JVD, supple, symmetrical, trachea midline and thyroid not enlarged, symmetric, no tenderness/mass/nodules Lungs: clear to auscultation bilaterally Heart: regular rate and rhythm, S1, S2 normal, no  murmur, click, rub or gallop Abdomen: soft, non-tender; bowel sounds normal; no masses,  no organomegaly Extremities: extremities normal, atraumatic, no cyanosis or edema Pulses: 2+ and symmetric Skin: Skin color, texture, turgor normal. No rashes or lesions Lymph nodes: Cervical, supraclavicular, and axillary nodes normal. Neurologic: Alert and oriented X 3, normal strength and tone. Normal symmetric reflexes. Normal coordination and gait    Assessment:    Healthy female exam.     Plan:     Anticipatory guidance given including wearing seatbelts, smoke detectors in the home, increasing physical activity, increasing p.o. intake of water and vegetables. -PHQ-9 score 6 -GAD-7 score 5 -will obtain labs -mammogram done 11/06/19 -Colonoscopy done 12/02/2017 -Given handout -Next CPE in 1 year See After Visit Summary for Counseling Recommendations    Essential hypertension  -Discussed lifestyle modifications -Continue current meds - Plan: BASIC METABOLIC PANEL WITH GFR, lisinopril (ZESTRIL) 30 MG tablet, triamterene-hydrochlorothiazide (MAXZIDE-25) 37.5-25 MG tablet  Hyperlipidemia, unspecified hyperlipidemia type -Continue Lipitor -Continue lifestyle modifications - Plan: Lipid panel  Type 2 diabetes mellitus with other specified complication, without long-term current use of insulin (HCC) -Last hemoglobin A1c 9.0% on 11/06/2019 -Diabetic retinopathy screening encouraged -Last foot exam 05/09/2019 -Recent rash 2/2 Trulicity -Continue current medications per endocrinology -Continue follow-up with endocrinology  Follow-up as needed in the next few months.  Grier Mitts, MD

## 2019-12-08 LAB — BASIC METABOLIC PANEL WITH GFR
BUN: 16 mg/dL (ref 7–25)
CO2: 28 mmol/L (ref 20–32)
Calcium: 10.1 mg/dL (ref 8.6–10.4)
Chloride: 102 mmol/L (ref 98–110)
Creat: 0.76 mg/dL (ref 0.50–0.99)
GFR, Est African American: 98 mL/min/{1.73_m2} (ref >=60)
GFR, Est Non African American: 85 mL/min/{1.73_m2} (ref >=60)
Glucose, Bld: 220 mg/dL — ABNORMAL HIGH (ref 65–99)
Potassium: 3.6 mmol/L (ref 3.5–5.3)
Sodium: 140 mmol/L (ref 135–146)

## 2019-12-08 LAB — LIPID PANEL
Cholesterol: 156 mg/dL (ref <200)
HDL: 57 mg/dL (ref >=50)
LDL Cholesterol (Calc): 84 mg/dL (calc)
Non-HDL Cholesterol (Calc): 99 mg/dL (calc) (ref <130)
Total CHOL/HDL Ratio: 2.7 (calc) (ref <5.0)
Triglycerides: 71 mg/dL (ref <150)

## 2019-12-08 LAB — CBC WITH DIFFERENTIAL/PLATELET
Absolute Monocytes: 380 cells/uL (ref 200–950)
Basophils Absolute: 62 cells/uL (ref 0–200)
Basophils Relative: 0.9 %
Eosinophils Absolute: 311 cells/uL (ref 15–500)
Eosinophils Relative: 4.5 %
HCT: 39.4 % (ref 35.0–45.0)
Hemoglobin: 12.4 g/dL (ref 11.7–15.5)
Lymphs Abs: 2519 cells/uL (ref 850–3900)
MCH: 26.7 pg — ABNORMAL LOW (ref 27.0–33.0)
MCHC: 31.5 g/dL — ABNORMAL LOW (ref 32.0–36.0)
MCV: 84.9 fL (ref 80.0–100.0)
MPV: 10.7 fL (ref 7.5–12.5)
Monocytes Relative: 5.5 %
Neutro Abs: 3629 cells/uL (ref 1500–7800)
Neutrophils Relative %: 52.6 %
Platelets: 327 10*3/uL (ref 140–400)
RBC: 4.64 10*6/uL (ref 3.80–5.10)
RDW: 11.5 % (ref 11.0–15.0)
Total Lymphocyte: 36.5 %
WBC: 6.9 10*3/uL (ref 3.8–10.8)

## 2019-12-08 MED FILL — TRIAMTERENE-HCTZ 37.5-25 MG: 37.5-25 | 90 days supply | Qty: 90 | Fill #0

## 2019-12-14 MED FILL — OZEMPIC (1 MG/DOSE) 4 MG/3M: 4 | 28 days supply | Qty: 3 | Fill #2

## 2019-12-18 ENCOUNTER — Telehealth: Payer: Self-pay | Admitting: Internal Medicine

## 2019-12-18 NOTE — Telephone Encounter (Signed)
Patient wanted to inform Dr Kelton Pillar that she had a reaction (rash) to the trulicity and is going back to taking ozempic. FYI

## 2020-01-23 MED FILL — LISINOPRIL 30 MG TABLET: 30 | 90 days supply | Qty: 90 | Fill #0

## 2020-01-24 MED FILL — OZEMPIC (1 MG/DOSE) 4 MG/3M: 4 | 28 days supply | Qty: 3 | Fill #3

## 2020-01-25 ENCOUNTER — Telehealth: Payer: Self-pay | Admitting: Internal Medicine

## 2020-01-25 NOTE — Telephone Encounter (Signed)
Patient called and I requesting an RX for Free Style Libre to be sent to [facility]

## 2020-01-25 NOTE — Telephone Encounter (Signed)
Please advise. Okay to send Rx? Last seen at [facility] on 11/06/2019.

## 2020-01-26 MED ORDER — FREESTYLE LIBRE 2 SENSOR MISC: 1.0000 | 3 refills | Status: DC

## 2020-02-05 ENCOUNTER — Ambulatory Visit: Payer: 59 | Admitting: Internal Medicine

## 2020-02-06 MED FILL — glipiZIDE 10 MG TABS: 10 | 30 days supply | Qty: 60 | Fill #1

## 2020-02-08 ENCOUNTER — Ambulatory Visit: Payer: 59 | Admitting: Internal Medicine

## 2020-02-08 ENCOUNTER — Telehealth (INDEPENDENT_AMBULATORY_CARE_PROVIDER_SITE_OTHER): Payer: 59 | Admitting: Internal Medicine

## 2020-02-08 ENCOUNTER — Encounter: Payer: Self-pay | Admitting: Internal Medicine

## 2020-02-08 VITALS — BP 128/90 | Ht 63.0 in | Wt 200.0 lb

## 2020-02-08 DIAGNOSIS — E1165 Type 2 diabetes mellitus with hyperglycemia: Secondary | ICD-10-CM | POA: Diagnosis not present

## 2020-02-08 MED FILL — METFORMIN HCL 1000 MG TABS: 1000 | 90 days supply | Qty: 90 | Fill #1

## 2020-02-08 NOTE — Progress Notes (Signed)
Virtual Visit via Video Note  I connected with Laura Marsh on 11/05/21at 2:00 PM by a video enabled telemedicine application and verified that I am speaking with the correct person using two identifiers.   I discussed the limitations of evaluation and management by telemedicine and the availability of in person appointments. The patient expressed understanding and agreed to proceed.   -Location of the patient : Car -Location of the provider : Office -The names of all persons participating in the telemedicine service : Pt and myself               Name: Laura Marsh  Age/ Sex: 64 y.o., female   MRN/ DOB: [id number], October 17, 1955     PCP: Billie Ruddy, MD   Reason for Endocrinology Evaluation: Type 2 Diabetes Mellitus     Initial Endocrinology Clinic Visit: 06/16/2018    PATIENT IDENTIFIER: [name] is a 64 y.o. female with a past medical history of HTN, T2DM and Dyslipidemia. The patient has followed with Endocrinology clinic since 06/16/2018 for consultative assistance with management of her diabetes.  DIABETIC HISTORY:  Ms. Ent was diagnosed with T2DM ~ 2014. She has been on oral glycemic agents for years. She has never been on insulin therapy. Her hemoglobin A1c has ranged from 6.5% in 2014, peaking at 11.0% in 2019.   On her initial visit to our clinic she was on Metformin, Glipizide and Januvia, with an A1c 9.7 %.  We stopped Januvia and glipizide and started Ozempic with continuation of Metformin  She is allergic to Trulicity - rash   Works [employer]  SUBJECTIVE:   During the last visit (05/09/2019): A1c 7.5 % . We continued Metformin, and glipizide, and increased Ozempic   Today (02/08/2020): [name] is here for a month diabetes follow up.  She checks her blood sugars 1 times daily. The patient has not had hypoglycemic episodes since the last clinic visit.   Started exercising 2x a week    HOME DIABETES  REGIMEN:   Metformin 1000 mg Once daily  Glipizide 10 mg BID with meals  Ozempic 1 mg weekly   GLUCOSE LOG:  118-124 mg/dL     HISTORY:  Past Medical History:  Past Medical History:  Diagnosis Date  . Colon polyp   . Diabetes mellitus   . Elevated cholesterol   . Hypertension    Past Surgical History:  Past Surgical History:  Procedure Laterality Date  . ABDOMINAL HYSTERECTOMY    . COLONOSCOPY    . COLONOSCOPY WITH PROPOFOL N/A 11/08/2017   Procedure: COLONOSCOPY WITH PROPOFOL;  Surgeon: Toledo, Benay Pike, MD;  Location: [facility] ENDOSCOPY;  Service: Gastroenterology;  Laterality: N/A;    Social History:  reports that she has never smoked. She has never used smokeless tobacco. She reports current alcohol use. She reports that she does not use drugs. Family History:  Family History  Problem Relation Age of Onset  . Diabetes Mother   . Congestive Heart Failure Mother      HOME MEDICATIONS: Current Outpatient Medications on File Prior to Visit  Medication Sig Dispense Refill  . Alcohol Swabs PADS Use as directed. 100 each 12  . amLODipine (NORVASC) 5 MG tablet TAKE 1 TABLET (5 MG TOTAL) BY MOUTH DAILY. 90 tablet 0  . atorvastatin (LIPITOR) 20 MG tablet TAKE 1 TABLET BY MOUTH DAILY. 90 tablet 3  . Blood Glucose Monitoring Suppl (ACCU-CHEK GUIDE) w/Device KIT by Does not apply route.    Marland Kitchen  Continuous Blood Gluc Sensor (FREESTYLE LIBRE 2 SENSOR) MISC 1 Device by Does not apply route as directed. 6 each 3  . Dulaglutide (TRULICITY) 1.5 DE/0.8XK SOPN Inject 0.5 mLs (1.5 mg total) into the skin once a week. 4 pen 6  . fluticasone (FLONASE) 50 MCG/ACT nasal spray Place 2 sprays into both nostrils daily. 16 g 5  . glipiZIDE (GLUCOTROL) 10 MG tablet Take 1 tablet (10 mg total) by mouth 2 (two) times daily before a meal. 60 tablet 3  . lisinopril (ZESTRIL) 30 MG tablet TAKE 1 TABLET BY MOUTH ONCE DAILY 90 tablet 3  . loratadine (CLARITIN) 10 MG tablet Take 10 mg by mouth daily.    .  metFORMIN (GLUCOPHAGE) 1000 MG tablet Take 1 tablet (1,000 mg total) by mouth daily with breakfast. 90 tablet 3  . Multiple Vitamin (MULTIVITAMIN WITH MINERALS) TABS tablet Take 1 tablet by mouth daily.    Marland Kitchen triamcinolone ointment (KENALOG) 0.1 %     . triamterene-hydrochlorothiazide (MAXZIDE-25) 37.5-25 MG tablet TAKE 1 TABLET BY MOUTH DAILY 30 tablet 3   No current facility-administered medications on file prior to visit.     DATA REVIEWED:  Lab Results  Component Value Date   HGBA1C 9.0 (A) 11/06/2019   HGBA1C 7.5 (A) 05/09/2019   HGBA1C 6.7 (A) 01/05/2019   Lab Results  Component Value Date   MICROALBUR <0.7 05/09/2019   LDLCALC 84 12/07/2019   CREATININE 0.76 12/07/2019   Lab Results  Component Value Date   MICRALBCREAT 1.0 05/09/2019     Lab Results  Component Value Date   CHOL 156 12/07/2019   HDL 57 12/07/2019   LDLCALC 84 12/07/2019   LDLDIRECT 140.3 02/19/2013   TRIG 71 12/07/2019   CHOLHDL 2.7 12/07/2019         ASSESSMENT / PLAN / RECOMMENDATIONS:   1) Type 2 Diabetes Mellitus, Poorly controlled, Without complications - Most recent A1c of 9.0 %. Goal A1c < 7.0 %.     - This was a virtual visit and unable to obtain A1c at this time. Per pt's memory recall of glucose data, they have been option in the 120's mg/dL.  - NO changes will be made today   MEDICATIONS:  Continue Metformin 1000 mg Once daily  Continue  Glipizide 10 mg, twice daily  Continue Ozempic 1 mg weekly      EDUCATION / INSTRUCTIONS:  BG monitoring instructions: Patient is instructed to check her blood sugars 2 times a day, fasting and bedtime.  Call [place] [facility] if: BG persistently < 70 or > 300. . I reviewed the Rule of 15 for the treatment of hypoglycemia in detail with the patient. Literature supplied.  2) Diabetic complications:   Eye: Does not have known diabetic retinopathy.   Neuro/ Feet: Does not have known diabetic peripheral neuropathy.  Renal:  Patient does not have known baseline CKD. She is  on an ACEI/ARB at present.  Micro albumin urea testing is normal and up-to-date      F/U in 3 months   Signed electronically by: Mack Guise, MD  [facility]  [facility] [address] [place], [place] [address] Phone: [phone] FAX: [phone]   CC: Billie Ruddy, [place] [address] Phone: [phone]  Fax: [phone]  Return to Endocrinology clinic as below: Future Appointments  Date Time Provider [place]  02/08/2020  2:00 PM Makai Agostinelli, Melanie Crazier, MD [facility] None

## 2020-02-12 ENCOUNTER — Telehealth: Payer: Self-pay | Admitting: Internal Medicine

## 2020-02-12 NOTE — Telephone Encounter (Signed)
-----   Message from Cloyd Stagers, MD sent at 02/10/2020  7:37 AM EST ----- PLease schedule her for a 3 month follow up on DM     Thanks

## 2020-02-12 NOTE — Telephone Encounter (Signed)
LVM to schedule a 3 mo F/U for DM per provider request - PT had virtual visit 02/08/20 and this is a follow up to that

## 2020-02-15 ENCOUNTER — Ambulatory Visit: Payer: 59 | Admitting: Internal Medicine

## 2020-02-15 MED FILL — OZEMPIC (1 MG/DOSE) 4 MG/3M: 4 | 28 days supply | Qty: 3 | Fill #4

## 2020-02-21 DIAGNOSIS — Z23 Encounter for immunization: Secondary | ICD-10-CM | POA: Diagnosis not present

## 2020-02-21 DIAGNOSIS — Z86010 Personal history of colonic polyps: Secondary | ICD-10-CM | POA: Diagnosis not present

## 2020-02-21 DIAGNOSIS — Z09 Encounter for follow-up examination after completed treatment for conditions other than malignant neoplasm: Secondary | ICD-10-CM | POA: Diagnosis not present

## 2020-02-22 ENCOUNTER — Other Ambulatory Visit: Payer: Self-pay | Admitting: Family Medicine

## 2020-02-25 ENCOUNTER — Other Ambulatory Visit: Payer: Self-pay | Admitting: Family Medicine

## 2020-02-25 MED FILL — AMLODIPINE BESYLATE 5 MG TA: 5 | 90 days supply | Qty: 90 | Fill #0

## 2020-03-11 ENCOUNTER — Other Ambulatory Visit: Payer: Self-pay | Admitting: Internal Medicine

## 2020-03-11 MED FILL — glipiZIDE 10 MG TABS: 10 | 30 days supply | Qty: 60 | Fill #0

## 2020-03-26 ENCOUNTER — Other Ambulatory Visit: Payer: Self-pay | Admitting: Family Medicine

## 2020-03-31 ENCOUNTER — Other Ambulatory Visit: Payer: Self-pay | Admitting: Internal Medicine

## 2020-03-31 ENCOUNTER — Other Ambulatory Visit: Payer: Self-pay | Admitting: Family Medicine

## 2020-03-31 MED FILL — OZEMPIC (1 MG/DOSE) 4 MG/3M: 4 | 28 days supply | Qty: 3 | Fill #0

## 2020-03-31 MED FILL — ATORVASTATIN CALCIUM 20 MG: 20 | 90 days supply | Qty: 90 | Fill #0

## 2020-03-31 NOTE — Telephone Encounter (Signed)
Patient is calling back to check the status of medication refill for atorvastatin, please advise. CB is [phone]

## 2020-04-11 ENCOUNTER — Other Ambulatory Visit: Payer: Self-pay | Admitting: Internal Medicine

## 2020-04-11 MED FILL — TRIAMTERENE-HCTZ 37.5-25 MG: 37.5-25 | 30 days supply | Qty: 30 | Fill #1

## 2020-04-11 MED FILL — glipiZIDE 10 MG TABS: 10 | 30 days supply | Qty: 60 | Fill #1

## 2020-04-11 MED FILL — FREESTYLE LITE TEST STRIP: 50 days supply | Qty: 100 | Fill #0

## 2020-04-12 ENCOUNTER — Other Ambulatory Visit (HOSPITAL_COMMUNITY): Payer: Self-pay

## 2020-04-12 MED FILL — OMRON 3 SERIES BP MONITOR D: 30 days supply | Qty: 1 | Fill #0

## 2020-04-19 MED FILL — LISINOPRIL 30 MG TABLET: 30 | 85 days supply | Qty: 85 | Fill #1

## 2020-04-24 ENCOUNTER — Other Ambulatory Visit
Admission: RE | Admit: 2020-04-24 | Discharge: 2020-04-24 | Disposition: A | Discharge status: Home / Self Care | Payer: 59 | Source: Ambulatory Visit | Attending: Internal Medicine | Admitting: Internal Medicine

## 2020-04-24 ENCOUNTER — Other Ambulatory Visit: Payer: Self-pay

## 2020-04-24 DIAGNOSIS — Z20822 Contact with and (suspected) exposure to covid-19: Secondary | ICD-10-CM | POA: Insufficient documentation

## 2020-04-24 DIAGNOSIS — Z01812 Encounter for preprocedural laboratory examination: Secondary | ICD-10-CM | POA: Insufficient documentation

## 2020-04-24 LAB — SARS CORONAVIRUS 2 (TAT 6-24 HRS): SARS Coronavirus 2: NEGATIVE

## 2020-04-25 ENCOUNTER — Encounter: Payer: Self-pay | Admitting: Surgery

## 2020-04-28 ENCOUNTER — Ambulatory Visit: Payer: 59 | Admitting: Anesthesiology

## 2020-04-28 ENCOUNTER — Encounter: Payer: Self-pay | Admitting: Surgery

## 2020-04-28 ENCOUNTER — Ambulatory Visit
Admission: RE | Admit: 2020-04-28 | Discharge: 2020-04-28 | Disposition: A | Discharge status: Home / Self Care | Payer: 59 | Attending: Surgery | Admitting: Surgery

## 2020-04-28 ENCOUNTER — Encounter
Admission: RE | Disposition: A | Discharge status: Home / Self Care | Payer: Self-pay | Source: Home / Self Care | Attending: Surgery

## 2020-04-28 DIAGNOSIS — Z888 Allergy status to other drugs, medicaments and biological substances status: Secondary | ICD-10-CM | POA: Insufficient documentation

## 2020-04-28 DIAGNOSIS — Z7984 Long term (current) use of oral hypoglycemic drugs: Secondary | ICD-10-CM | POA: Insufficient documentation

## 2020-04-28 DIAGNOSIS — E1169 Type 2 diabetes mellitus with other specified complication: Secondary | ICD-10-CM | POA: Diagnosis not present

## 2020-04-28 DIAGNOSIS — D126 Benign neoplasm of colon, unspecified: Secondary | ICD-10-CM | POA: Diagnosis not present

## 2020-04-28 DIAGNOSIS — Z79899 Other long term (current) drug therapy: Secondary | ICD-10-CM | POA: Diagnosis not present

## 2020-04-28 DIAGNOSIS — D128 Benign neoplasm of rectum: Secondary | ICD-10-CM | POA: Insufficient documentation

## 2020-04-28 DIAGNOSIS — Z86010 Personal history of colonic polyps: Secondary | ICD-10-CM | POA: Insufficient documentation

## 2020-04-28 DIAGNOSIS — K635 Polyp of colon: Secondary | ICD-10-CM | POA: Insufficient documentation

## 2020-04-28 DIAGNOSIS — Z1211 Encounter for screening for malignant neoplasm of colon: Secondary | ICD-10-CM | POA: Insufficient documentation

## 2020-04-28 DIAGNOSIS — I10 Essential (primary) hypertension: Secondary | ICD-10-CM | POA: Diagnosis not present

## 2020-04-28 DIAGNOSIS — K649 Unspecified hemorrhoids: Secondary | ICD-10-CM | POA: Diagnosis not present

## 2020-04-28 DIAGNOSIS — E785 Hyperlipidemia, unspecified: Secondary | ICD-10-CM | POA: Diagnosis not present

## 2020-04-28 HISTORY — PX: COLONOSCOPY WITH PROPOFOL: SHX5780

## 2020-04-28 LAB — GLUCOSE, CAPILLARY: Glucose-Capillary: 208 mg/dL — ABNORMAL HIGH (ref 70–99)

## 2020-04-28 SURGERY — COLONOSCOPY WITH PROPOFOL
Anesthesia: General

## 2020-04-28 MED ORDER — LIDOCAINE 2% (20 MG/ML) 5 ML SYRINGE
INTRAMUSCULAR | Status: DC | PRN
  Administered 2020-04-28: 50 mg via INTRAVENOUS

## 2020-04-28 MED ORDER — PROPOFOL 10 MG/ML IV BOLUS
INTRAVENOUS | Status: DC | PRN
  Administered 2020-04-28: 50 mg via INTRAVENOUS
  Administered 2020-04-28: 20 mg via INTRAVENOUS

## 2020-04-28 MED ORDER — SODIUM CHLORIDE 0.9 % IV SOLN: INTRAVENOUS | Status: DC

## 2020-04-28 MED ORDER — PROPOFOL 500 MG/50ML IV EMUL
INTRAVENOUS | Status: AC
  Filled 2020-04-28: qty 50

## 2020-04-28 MED ORDER — PROPOFOL 500 MG/50ML IV EMUL
INTRAVENOUS | Status: DC | PRN
  Administered 2020-04-28: 125 ug/kg/min via INTRAVENOUS

## 2020-04-28 NOTE — H&P (Signed)
Subjective:   CC: hx of polyps  HPI:  Laura Marsh is a 65 y.o. female here today for screening colonoscopy for history of polyps.  No changes since last visit in office.   Past Medical History:  has a past medical history of Colon polyp, Diabetes mellitus, Elevated cholesterol, and Hypertension.  Past Surgical History:  has a past surgical history that includes Abdominal hysterectomy; Colonoscopy; and Colonoscopy with propofol (N/A, 11/08/2017).  Family History: family history includes Congestive Heart Failure in her mother; Diabetes in her mother.  Social History:  reports that she has never smoked. She has never used smokeless tobacco. She reports current alcohol use. She reports that she does not use drugs.  Current Medications:  Prior to Admission medications   Medication Sig Start Date End Date Taking? Authorizing Provider  Alcohol Swabs PADS Use as directed. 10/23/12   Lucretia Kern, DO  amLODipine (NORVASC) 5 MG tablet TAKE 1 TABLET BY MOUTH DAILY. 02/25/20   Billie Ruddy, MD  atorvastatin (LIPITOR) 20 MG tablet TAKE 1 TABLET BY MOUTH DAILY 03/31/20   Billie Ruddy, MD  Blood Glucose Monitoring Suppl (ACCU-CHEK GUIDE) w/Device KIT by Does not apply route.    [provider]  Continuous Blood Gluc Sensor (FREESTYLE LIBRE 2 SENSOR) MISC 1 Device by Does not apply route as directed. 01/26/20   Shamleffer, Melanie Crazier, MD  Dulaglutide (TRULICITY) 1.5 LZ/7.6BH SOPN Inject 0.5 mLs (1.5 mg total) into the skin once a week. Patient not taking: Reported on 02/08/2020 11/06/19   Shamleffer, Melanie Crazier, MD  fluticasone (FLONASE) 50 MCG/ACT nasal spray Place 2 sprays into both nostrils daily. 08/02/17   Colin Benton R, DO  FREESTYLE LITE test strip USE TO TEST BLOOD SUGAR TWO TIMES DAILY 04/11/20   Shamleffer, Melanie Crazier, MD  glipiZIDE (GLUCOTROL) 10 MG tablet TAKE 1 TABLET BY MOUTH 2 TIMES DAILY BEFORE A MEAL. 03/11/20   Shamleffer, Melanie Crazier, MD  lisinopril  (ZESTRIL) 30 MG tablet TAKE 1 TABLET BY MOUTH ONCE DAILY 12/07/19   Billie Ruddy, MD  loratadine (CLARITIN) 10 MG tablet Take 10 mg by mouth daily.    [provider]  metFORMIN (GLUCOPHAGE) 1000 MG tablet Take 1 tablet (1,000 mg total) by mouth daily with breakfast. 09/05/19   Shamleffer, Melanie Crazier, MD  Multiple Vitamin (MULTIVITAMIN WITH MINERALS) TABS tablet Take 1 tablet by mouth daily.    [provider]  OZEMPIC, 1 MG/DOSE, 4 MG/3ML SOPN INJECT 1 MG INTO THE SKIN ONCE A WEEK. 03/31/20   Shamleffer, Melanie Crazier, MD  triamcinolone ointment (KENALOG) 0.1 %  12/29/18   [provider]  triamterene-hydrochlorothiazide (MAXZIDE-25) 37.5-25 MG tablet TAKE 1 TABLET BY MOUTH DAILY 12/07/19   Billie Ruddy, MD    Allergies:  Allergies  Allergen Reactions  . Trulicity [Dulaglutide] Itching    Rash/itching at injection site    ROS:  General: Denies weight loss, weight gain, fatigue, fevers, chills, and night sweats. Eyes: Denies blurry vision, double vision, eye pain, itchy eyes, and tearing. Ears: Denies hearing loss, earache, and ringing in ears. Nose: Denies sinus pain, congestion, infections, runny nose, and nosebleeds. Mouth/throat: Denies hoarseness, sore throat, bleeding gums, and difficulty swallowing. Heart: Denies chest pain, palpitations, racing heart, irregular heartbeat, leg pain or swelling, and decreased activity tolerance. Respiratory: Denies breathing difficulty, shortness of breath, wheezing, cough, and sputum. GI: Denies change in appetite, heartburn, nausea, vomiting, constipation, diarrhea, and blood in stool. GU: Denies difficulty urinating, pain with urinating,  urgency, frequency, blood in urine. Musculoskeletal: Denies joint stiffness, pain, swelling, muscle weakness. Skin: Denies rash, itching, mass, tumors, sores, and boils Neurologic: Denies headache, fainting, dizziness, seizures, numbness, and tingling. Psychiatric: Denies  depression, anxiety, difficulty sleeping, and memory loss. Endocrine: Denies heat or cold intolerance, and increased thirst or urination. Blood/lymph: Denies easy bruising, easy bruising, and swollen glands     Objective:     There were no vitals taken for this visit.  Constitutional :  alert, cooperative, appears stated age and no distress  Lymphatics/Throat:  no asymmetry, masses, or scars  Respiratory:  clear to auscultation bilaterally  Cardiovascular:  regular rate and rhythm  Gastrointestinal: soft, non-tender; bowel sounds normal; no masses,  no organomegaly.  Musculoskeletal: Steady movement  Skin: Cool and moist  Psychiatric: Normal affect, non-agitated, not confused       LABS:  n/a RADS: n/a  Assessment:     personal hx of colon polyps   Plan:    r/b/a to colonoscopy discussed again briefly.  All questions addressed . Proceed as scheduled

## 2020-04-28 NOTE — Anesthesia Procedure Notes (Signed)
Procedure Name: MAC Date/Time: 04/28/2020 2:14 PM Performed by: Jerrye Noble, CRNA Pre-anesthesia Checklist: Patient identified, Emergency Drugs available, Suction available and Patient being monitored Patient Re-evaluated:Patient Re-evaluated prior to induction Oxygen Delivery Method: Nasal cannula

## 2020-04-28 NOTE — Transfer of Care (Signed)
Immediate Anesthesia Transfer of Care Note  Patient: Laura Marsh  Procedure(s) Performed: COLONOSCOPY WITH PROPOFOL (N/A )  Patient Location: PACU  Anesthesia Type:General  Level of Consciousness: drowsy  Airway & Oxygen Therapy: Patient Spontanous Breathing and Patient connected to nasal cannula oxygen  Post-op Assessment: Report given to RN and Post -op Vital signs reviewed and stable  Post vital signs: Reviewed and stable  Last Vitals:  Vitals Value Taken Time  BP 115/69 04/28/20 1456  Temp    Pulse 98 04/28/20 1457  Resp 17 04/28/20 1457  SpO2 93 % 04/28/20 1457  Vitals shown include unvalidated device data.  Last Pain:  Vitals:   04/28/20 1308  TempSrc: Temporal  PainSc: 0-No pain         Complications: No complications documented.

## 2020-04-28 NOTE — Anesthesia Preprocedure Evaluation (Signed)
Anesthesia Evaluation  Patient identified by MRN, date of birth, ID band Patient awake    Reviewed: Allergy & Precautions, H&P , NPO status , Patient's Chart, lab work & pertinent test results  History of Anesthesia Complications Negative for: history of anesthetic complications  Airway Mallampati: III  TM Distance: >3 FB Neck ROM: full    Dental  (+) Chipped   Pulmonary neg pulmonary ROS, neg shortness of breath,    Pulmonary exam normal        Cardiovascular Exercise Tolerance: Good hypertension, (-) angina(-) Past MI and (-) DOE Normal cardiovascular exam     Neuro/Psych negative neurological ROS  negative psych ROS   GI/Hepatic negative GI ROS, Neg liver ROS,   Endo/Other  diabetes, Type 2  Renal/GU negative Renal ROS  negative genitourinary   Musculoskeletal   Abdominal   Peds  Hematology negative hematology ROS (+)   Anesthesia Other Findings Past Medical History: No date: Colon polyp No date: Diabetes mellitus No date: Elevated cholesterol No date: Hypertension  Past Surgical History: No date: ABDOMINAL HYSTERECTOMY No date: COLONOSCOPY 11/08/2017: COLONOSCOPY WITH PROPOFOL; N/A     Comment:  Procedure: COLONOSCOPY WITH PROPOFOL;  Surgeon: Toledo,               Benay Pike, MD;  Location: [facility] ENDOSCOPY;  Service:               Gastroenterology;  Laterality: N/A;  BMI    Body Mass Index: 35.07 kg/m      Reproductive/Obstetrics negative OB ROS                             Anesthesia Physical Anesthesia Plan  ASA: III  Anesthesia Plan: General   Post-op Pain Management:    Induction: Intravenous  PONV Risk Score and Plan: Propofol infusion and TIVA  Airway Management Planned: Natural Airway and Nasal Cannula  Additional Equipment:   Intra-op Plan:   Post-operative Plan:   Informed Consent: I have reviewed the patients History and Physical, chart, labs and  discussed the procedure including the risks, benefits and alternatives for the proposed anesthesia with the patient or authorized representative who has indicated his/her understanding and acceptance.     Dental Advisory Given  Plan Discussed with: Anesthesiologist, CRNA and Surgeon  Anesthesia Plan Comments: (Patient consented for risks of anesthesia including but not limited to:  - adverse reactions to medications - risk of airway placement if required - damage to eyes, teeth, lips or other oral mucosa - nerve damage due to positioning  - sore throat or hoarseness - Damage to heart, brain, nerves, lungs, other parts of body or loss of life  Patient voiced understanding.)        Anesthesia Quick Evaluation

## 2020-04-28 NOTE — Anesthesia Postprocedure Evaluation (Signed)
Anesthesia Post Note  Patient: Laura Marsh  Procedure(s) Performed: COLONOSCOPY WITH PROPOFOL (N/A )  Patient location during evaluation: Endoscopy Anesthesia Type: General Level of consciousness: awake and alert Pain management: pain level controlled Vital Signs Assessment: post-procedure vital signs reviewed and stable Respiratory status: spontaneous breathing, nonlabored ventilation, respiratory function stable and patient connected to nasal cannula oxygen Cardiovascular status: blood pressure returned to baseline and stable Postop Assessment: no apparent nausea or vomiting Anesthetic complications: no   No complications documented.   Last Vitals:  Vitals:   04/28/20 1515 04/28/20 1525  BP: 110/69 (!) 102/58  Pulse:    Resp:  16  Temp:    SpO2:      Last Pain:  Vitals:   04/28/20 1525  TempSrc:   PainSc: 0-No pain                 Precious Haws Carnie Bruemmer

## 2020-04-28 NOTE — Op Note (Signed)
[facility] Gastroenterology Patient Name: Laura Marsh Procedure Date: 04/28/2020 2:01 PM MRN: [id number] Account #: [id number] Date of Birth: 01-02-1956 Admit Type: Outpatient Age: 65 Room: [facility] ENDO ROOM 2 Gender: Female Note Status: Finalized Procedure:             Colonoscopy Indications:           High risk colon cancer surveillance: Personal history                         of colonic polyps Providers:             Eliseo Squires MD, MD Medicines:             Propofol per Anesthesia Complications:         No immediate complications. Procedure:             Pre-Anesthesia Assessment:                        - After reviewing the risks and benefits, the patient                         was deemed in satisfactory condition to undergo the                         procedure in an ambulatory setting.                        After obtaining informed consent, the colonoscope was                         passed under direct vision. Throughout the procedure,                         the patient's blood pressure, pulse, and oxygen                         saturations were monitored continuously. The                         Colonoscope was introduced through the anus and                         advanced to the the cecum, identified by the ileocecal                         valve. The colonoscopy was technically difficult and                         complex due to a redundant colon. Successful                         completion of the procedure was aided by applying                         abdominal pressure. The patient tolerated the                         procedure well. The quality of the bowel preparation  was fair. Findings:      The perianal and digital rectal examinations were normal.      A 3 mm polyp was found in the proximal descending colon. The polyp was       sessile. Biopsies were taken with a cold forceps for histology.       Estimated  blood loss was minimal.      A 10 to 11 mm polyp was found in the distal rectum. The polyp was       carpeted and expanding 1/3 circumfrence of rectum. Biopsies were taken       with a cold forceps for histology. Estimated blood loss was minimal. Impression:            - Preparation of the colon was fair.                        - One 3 mm polyp in the proximal descending colon.                         Biopsied.                        - One 10 to 11 mm polyp in the distal rectum. Biopsied. Recommendation:        - Await pathology results.                        - Written discharge instructions were provided to the                         patient.                        - Discharge patient to home.                        - Resume previous diet. Procedure Code(s):     --- Professional ---                        [id number], Colonoscopy, flexible; with biopsy, single or                         multiple Diagnosis Code(s):     --- Professional ---                        K62.1, Rectal polyp                        K63.5, Polyp of colon                        Z86.010, Personal history of colonic polyps CPT copyright 2019 [employer]. All rights reserved. The codes documented in this report are preliminary and upon coder review may  be revised to meet current compliance requirements. Dr. Sheppard Penton, MD Eliseo Squires MD, MD 04/28/2020 2:59:38 PM This report has been signed electronically. Number of Addenda: 0 Note Initiated On: 04/28/2020 2:01 PM Scope Withdrawal Time: 0 hours 12 minutes 43 seconds  Total Procedure Duration: 0 hours 32 minutes 42 seconds       [facility]

## 2020-04-29 ENCOUNTER — Encounter: Payer: Self-pay | Admitting: Surgery

## 2020-04-30 ENCOUNTER — Ambulatory Visit: Admit: 2020-04-30 | Payer: 59 | Admitting: Internal Medicine

## 2020-04-30 LAB — SURGICAL PATHOLOGY

## 2020-05-02 MED FILL — OZEMPIC (1 MG/DOSE) 4 MG/3M: 4 | 28 days supply | Qty: 3 | Fill #1

## 2020-05-10 ENCOUNTER — Other Ambulatory Visit: Payer: Self-pay | Admitting: Family Medicine

## 2020-05-10 DIAGNOSIS — I10 Essential (primary) hypertension: Secondary | ICD-10-CM

## 2020-05-10 MED FILL — TRIAMTERENE-HCTZ 37.5-25 MG: 37.5-25 | 30 days supply | Qty: 30 | Fill #0

## 2020-05-10 MED FILL — glipiZIDE 10 MG TABS: 10 | 30 days supply | Qty: 60 | Fill #2

## 2020-05-23 ENCOUNTER — Other Ambulatory Visit: Payer: Self-pay | Admitting: Internal Medicine

## 2020-05-23 ENCOUNTER — Other Ambulatory Visit: Payer: Self-pay | Admitting: Family Medicine

## 2020-05-23 MED FILL — METFORMIN HCL 1000 MG TABS: 1000 | 90 days supply | Qty: 90 | Fill #2

## 2020-05-24 MED FILL — OZEMPIC (1 MG/DOSE) 4 MG/3M: 4 | 28 days supply | Qty: 3 | Fill #0

## 2020-05-26 ENCOUNTER — Other Ambulatory Visit: Payer: Self-pay | Admitting: Family Medicine

## 2020-05-26 MED FILL — AMLODIPINE BESYLATE 5 MG TA: 5 | 90 days supply | Qty: 90 | Fill #0

## 2020-06-09 MED FILL — glipiZIDE 10 MG TABS: 10 | 30 days supply | Qty: 60 | Fill #0

## 2020-06-16 ENCOUNTER — Emergency Department (HOSPITAL_COMMUNITY)
Admission: EM | Admit: 2020-06-16 | Discharge: 2020-06-16 | Disposition: A | Discharge status: Home / Self Care | Payer: 59 | Attending: Emergency Medicine | Admitting: Emergency Medicine

## 2020-06-16 ENCOUNTER — Emergency Department (HOSPITAL_COMMUNITY): Payer: 59

## 2020-06-16 ENCOUNTER — Ambulatory Visit: Payer: 59 | Admitting: Family Medicine

## 2020-06-16 ENCOUNTER — Encounter (HOSPITAL_COMMUNITY): Payer: Self-pay | Admitting: Emergency Medicine

## 2020-06-16 ENCOUNTER — Other Ambulatory Visit: Payer: Self-pay

## 2020-06-16 DIAGNOSIS — I10 Essential (primary) hypertension: Secondary | ICD-10-CM | POA: Diagnosis not present

## 2020-06-16 DIAGNOSIS — R29810 Facial weakness: Secondary | ICD-10-CM | POA: Diagnosis not present

## 2020-06-16 DIAGNOSIS — Z79899 Other long term (current) drug therapy: Secondary | ICD-10-CM | POA: Insufficient documentation

## 2020-06-16 DIAGNOSIS — E119 Type 2 diabetes mellitus without complications: Secondary | ICD-10-CM | POA: Insufficient documentation

## 2020-06-16 DIAGNOSIS — E1169 Type 2 diabetes mellitus with other specified complication: Secondary | ICD-10-CM

## 2020-06-16 DIAGNOSIS — Z7984 Long term (current) use of oral hypoglycemic drugs: Secondary | ICD-10-CM | POA: Diagnosis not present

## 2020-06-16 DIAGNOSIS — R519 Headache, unspecified: Secondary | ICD-10-CM | POA: Diagnosis not present

## 2020-06-16 LAB — I-STAT CHEM 8, ED
BUN: 11 mg/dL (ref 8–23)
Calcium, Ion: 1.13 mmol/L — ABNORMAL LOW (ref 1.15–1.40)
Chloride: 104 mmol/L (ref 98–111)
Creatinine, Ser: 0.6 mg/dL (ref 0.44–1.00)
Glucose, Bld: 149 mg/dL — ABNORMAL HIGH (ref 70–99)
HCT: 40 % (ref 36.0–46.0)
Hemoglobin: 13.6 g/dL (ref 12.0–15.0)
Potassium: 3.2 mmol/L — ABNORMAL LOW (ref 3.5–5.1)
Sodium: 142 mmol/L (ref 135–145)
TCO2: 26 mmol/L (ref 22–32)

## 2020-06-16 LAB — CBC
HCT: 38.5 % (ref 36.0–46.0)
Hemoglobin: 12.2 g/dL (ref 12.0–15.0)
MCH: 27.4 pg (ref 26.0–34.0)
MCHC: 31.7 g/dL (ref 30.0–36.0)
MCV: 86.5 fL (ref 80.0–100.0)
Platelets: 317 10*3/uL (ref 150–400)
RBC: 4.45 MIL/uL (ref 3.87–5.11)
RDW: 13.2 % (ref 11.5–15.5)
WBC: 8 10*3/uL (ref 4.0–10.5)
nRBC: 0 % (ref 0.0–0.2)

## 2020-06-16 LAB — DIFFERENTIAL
Abs Immature Granulocytes: 0.03 10*3/uL (ref 0.00–0.07)
Basophils Absolute: 0.1 10*3/uL (ref 0.0–0.1)
Basophils Relative: 1 %
Eosinophils Absolute: 0.2 10*3/uL (ref 0.0–0.5)
Eosinophils Relative: 3 %
Immature Granulocytes: 0 %
Lymphocytes Relative: 41 %
Lymphs Abs: 3.3 10*3/uL (ref 0.7–4.0)
Monocytes Absolute: 0.4 10*3/uL (ref 0.1–1.0)
Monocytes Relative: 5 %
Neutro Abs: 4 10*3/uL (ref 1.7–7.7)
Neutrophils Relative %: 50 %

## 2020-06-16 LAB — COMPREHENSIVE METABOLIC PANEL
ALT: 19 U/L (ref 0–44)
AST: 15 U/L (ref 15–41)
Albumin: 3.9 g/dL (ref 3.5–5.0)
Alkaline Phosphatase: 115 U/L (ref 38–126)
Anion gap: 8 (ref 5–15)
BUN: 10 mg/dL (ref 8–23)
CO2: 27 mmol/L (ref 22–32)
Calcium: 9.8 mg/dL (ref 8.9–10.3)
Chloride: 105 mmol/L (ref 98–111)
Creatinine, Ser: 0.65 mg/dL (ref 0.44–1.00)
GFR, Estimated: >60 mL/min (ref >60)
Glucose, Bld: 151 mg/dL — ABNORMAL HIGH (ref 70–99)
Potassium: 3.3 mmol/L — ABNORMAL LOW (ref 3.5–5.1)
Sodium: 140 mmol/L (ref 135–145)
Total Bilirubin: 0.6 mg/dL (ref 0.3–1.2)
Total Protein: 6.9 g/dL (ref 6.5–8.1)

## 2020-06-16 LAB — PROTIME-INR
INR: 1 (ref 0.8–1.2)
Prothrombin Time: 12.8 seconds (ref 11.4–15.2)

## 2020-06-16 LAB — APTT: aPTT: 26 seconds (ref 24–36)

## 2020-06-16 LAB — CBG MONITORING, ED: Glucose-Capillary: 120 mg/dL — ABNORMAL HIGH (ref 70–99)

## 2020-06-16 MED ORDER — SODIUM CHLORIDE 0.9% FLUSH: 3.0000 mL | Freq: Once | INTRAVENOUS | Status: DC

## 2020-06-16 NOTE — ED Notes (Signed)
Pt to MRI via stretcher.

## 2020-06-16 NOTE — ED Triage Notes (Signed)
Patient coming from work. Complaint of facial droop that she noticed this morning around 0900. Patient states it will get a little better and then become worse again. A&Ox4. No other deficits.

## 2020-06-16 NOTE — ED Notes (Signed)
Pt verbalized understanding of d/c instructions, follow up and instructions for return to ED. Pt transported to daughters vehicle via WC, NAD. A & O

## 2020-06-16 NOTE — ED Provider Notes (Signed)
[facility] EMERGENCY DEPARTMENT Provider Note   CSN: [id number] Arrival date & time: 06/16/20  1503     History Chief Complaint  Patient presents with  . Facial Droop    Laura Marsh is a 65 y.o. female.  The patient states that she has had some drooping of the right side of her lip that has been constant but has waxed and waned throughout the day.  She states that if she thinks about it, she can make her mouth look normal, but she relaxes, the lip droops.  She denies any numbness.  She denies any other neurologic symptoms.  The history is provided by the patient.  Neurologic Problem This is a new problem. Episode onset: 8 hours ago. The problem occurs constantly. The problem has not changed since onset.Pertinent negatives include no chest pain, no abdominal pain, no headaches and no shortness of breath. Nothing aggravates the symptoms. Nothing relieves the symptoms. She has tried nothing for the symptoms. The treatment provided no relief.       Past Medical History:  Diagnosis Date  . Colon polyp   . Diabetes mellitus   . Elevated cholesterol   . Hypertension     Patient Active Problem List   Diagnosis Date Noted  . Hyperlipidemia associated with type 2 diabetes mellitus ([place]) 08/25/2017  . Morbid obesity ([place]) 08/25/2017  . Ganglion cyst of wrist 12/25/2014  . De Quervain's disease (tenosynovitis) 06/25/2014  . Hypertension associated with diabetes ([place]) 04/13/2012  . Diabetes ([place]) 04/13/2012    Past Surgical History:  Procedure Laterality Date  . ABDOMINAL HYSTERECTOMY    . COLONOSCOPY    . COLONOSCOPY WITH PROPOFOL N/A 11/08/2017   Procedure: COLONOSCOPY WITH PROPOFOL;  Surgeon: Toledo, Benay Pike, MD;  Location: [facility] ENDOSCOPY;  Service: Gastroenterology;  Laterality: N/A;  . COLONOSCOPY WITH PROPOFOL N/A 04/28/2020   Procedure: COLONOSCOPY WITH PROPOFOL;  Surgeon: Benjamine Sprague, DO;  Location: [facility] ENDOSCOPY;  Service: General;  Laterality:  N/A;     OB History   No obstetric history on file.     Family History  Problem Relation Age of Onset  . Diabetes Mother   . Congestive Heart Failure Mother     Social History   Tobacco Use  . Smoking status: Never Smoker  . Smokeless tobacco: Never Used  Vaping Use  . Vaping Use: Never used  Substance Use Topics  . Alcohol use: Yes    Comment: rarely  . Drug use: No    Home Medications Prior to Admission medications   Medication Sig Start Date End Date Taking? Authorizing Provider  Alcohol Swabs PADS Use as directed. 10/23/12   Lucretia Kern, DO  amLODipine (NORVASC) 5 MG tablet TAKE 1 TABLET BY MOUTH DAILY. 05/26/20   Billie Ruddy, MD  atorvastatin (LIPITOR) 20 MG tablet TAKE 1 TABLET BY MOUTH DAILY 03/31/20   Billie Ruddy, MD  Blood Glucose Monitoring Suppl (ACCU-CHEK GUIDE) w/Device KIT by Does not apply route.    [provider]  Continuous Blood Gluc Sensor (FREESTYLE LIBRE 2 SENSOR) MISC 1 Device by Does not apply route as directed. 01/26/20   Shamleffer, Melanie Crazier, MD  Dulaglutide (TRULICITY) 1.5 YN/8.2NF SOPN Inject 0.5 mLs (1.5 mg total) into the skin once a week. Patient not taking: Reported on 02/08/2020 11/06/19   Shamleffer, Melanie Crazier, MD  fluticasone (FLONASE) 50 MCG/ACT nasal spray Place 2 sprays into both nostrils daily. 08/02/17   Lucretia Kern, DO  FREESTYLE LITE  test strip USE TO TEST BLOOD SUGAR TWO TIMES DAILY 04/11/20   Shamleffer, Melanie Crazier, MD  glipiZIDE (GLUCOTROL) 10 MG tablet TAKE 1 TABLET BY MOUTH 2 TIMES DAILY BEFORE A MEAL 05/23/20   Shamleffer, Melanie Crazier, MD  lisinopril (ZESTRIL) 30 MG tablet TAKE 1 TABLET BY MOUTH ONCE DAILY 12/07/19   Billie Ruddy, MD  loratadine (CLARITIN) 10 MG tablet Take 10 mg by mouth daily.    [provider]  metFORMIN (GLUCOPHAGE) 1000 MG tablet Take 1 tablet (1,000 mg total) by mouth daily with breakfast. 09/05/19   Shamleffer, Melanie Crazier, MD  Multiple Vitamin  (MULTIVITAMIN WITH MINERALS) TABS tablet Take 1 tablet by mouth daily.    [provider]  OZEMPIC, 1 MG/DOSE, 4 MG/3ML SOPN INJECT 1 MG INTO THE SKIN ONCE A WEEK. 05/23/20   Shamleffer, Melanie Crazier, MD  triamcinolone ointment (KENALOG) 0.1 %  12/29/18   [provider]  triamterene-hydrochlorothiazide (MAXZIDE-25) 37.5-25 MG tablet TAKE 1 TABLET BY MOUTH ONCE DAILY 05/10/20   Billie Ruddy, MD    Allergies    Trulicity [dulaglutide]  Review of Systems   Review of Systems  Constitutional: Negative for chills and fever.  HENT: Negative for ear pain and sore throat.   Eyes: Negative for pain and visual disturbance.  Respiratory: Negative for cough and shortness of breath.   Cardiovascular: Negative for chest pain and palpitations.  Gastrointestinal: Negative for abdominal pain and vomiting.  Genitourinary: Negative for dysuria and hematuria.  Musculoskeletal: Negative for arthralgias and back pain.  Skin: Negative for color change and rash.  Neurological: Positive for facial asymmetry. Negative for dizziness, tremors, seizures, syncope, speech difficulty, weakness, light-headedness, numbness and headaches.  All other systems reviewed and are negative.   Physical Exam Updated Vital Signs BP (!) 154/105 (BP Location: Left Arm)   Pulse (!) 103   Temp 98.4 F (36.9 C) (Oral)   Resp 15   Ht _0  (1.6 m)   Wt 90.7 kg   SpO2 98%   BMI 35.43 kg/m   Physical Exam Vitals and nursing note reviewed.  Constitutional:      General: She is not in acute distress.    Appearance: She is well-developed.  HENT:     Head: Normocephalic and atraumatic.  Eyes:     Conjunctiva/sclera: Conjunctivae normal.  Cardiovascular:     Rate and Rhythm: Normal rate and regular rhythm.     Heart sounds: No murmur heard.   Pulmonary:     Effort: Pulmonary effort is normal. No respiratory distress.     Breath sounds: Normal breath sounds.  Abdominal:     Palpations: Abdomen is  soft.     Tenderness: There is no abdominal tenderness.  Musculoskeletal:     Cervical back: Neck supple.  Skin:    General: Skin is warm and dry.  Neurological:     General: No focal deficit present.     Mental Status: She is alert and oriented to person, place, and time.     Cranial Nerves: No cranial nerve deficit.     Sensory: No sensory deficit.     Motor: No weakness.     Coordination: Coordination normal.  Psychiatric:        Mood and Affect: Mood normal.     ED Results / Procedures / Treatments   Labs (all labs ordered are listed, but only abnormal results are displayed) Labs Reviewed  COMPREHENSIVE METABOLIC PANEL - Abnormal; Notable for the following components:  Result Value   Potassium 3.3 (*)    Glucose, Bld 151 (*)    All other components within normal limits  I-STAT CHEM 8, ED - Abnormal; Notable for the following components:   Potassium 3.2 (*)    Glucose, Bld 149 (*)    Calcium, Ion 1.13 (*)    All other components within normal limits  CBG MONITORING, ED - Abnormal; Notable for the following components:   Glucose-Capillary 120 (*)    All other components within normal limits  PROTIME-INR  APTT  CBC  DIFFERENTIAL    EKG None  Radiology CT HEAD WO CONTRAST  Result Date: 06/16/2020 CLINICAL DATA:  Facial droop, headache. EXAM: CT HEAD WITHOUT CONTRAST TECHNIQUE: Contiguous axial images were obtained from the base of the skull through the vertex without intravenous contrast. COMPARISON:  None. FINDINGS: Brain: No evidence of acute infarction, hemorrhage, hydrocephalus, extra-axial collection or mass lesion/mass effect. Vascular: No hyperdense vessel or unexpected calcification. Skull: Normal. Negative for fracture or focal lesion. Sinuses/Orbits: No acute finding. Other: None. IMPRESSION: Normal head CT. Electronically Signed   By: Marijo Conception M.D.   On: 06/16/2020 16:17   MR Brain Wo Contrast (neuro protocol)  Result Date: 06/16/2020 CLINICAL  DATA:  Facial droop.  Headache.  Stroke suspected. EXAM: MRI HEAD WITHOUT CONTRAST TECHNIQUE: Multiplanar, multiecho pulse sequences of the brain and surrounding structures were obtained without intravenous contrast. COMPARISON:  Head CT earlier same day. FINDINGS: Brain: The brain has a normal appearance without evidence of malformation, atrophy, old or acute small or large vessel infarction, mass lesion, hemorrhage, hydrocephalus or extra-axial collection. Vascular: Major vessels at the base of the brain show flow. Venous sinuses appear patent. Skull and upper cervical spine: Heterogeneous marrow pattern in the cervical spine, nonspecific. Sinuses/Orbits: Clear/normal. Other: None significant. IMPRESSION: 1. Normal appearance of the brain. No abnormality seen to explain the presenting symptoms. 2. Heterogeneous marrow pattern in the cervical spine, nonspecific. Electronically Signed   By: Nelson Chimes M.D.   On: 06/16/2020 20:03    Procedures Procedures   Medications Ordered in ED Medications  sodium chloride flush (NS) 0.9 % injection 3 mL (has no administration in time range)    ED Course  I have reviewed the triage vital signs and the nursing notes.  Pertinent labs & imaging results that were available during my care of the patient were reviewed by me and considered in my medical decision making (see chart for details).  Clinical Course as of 06/16/20 2014  Mon Jun 16, 2020  1723 Spoke to Dr. Leonel Ramsay with neurology.  Patient should receive an MRI and can go home if normal. [AW]    Clinical Course User Index [AW] Arnaldo Natal, MD   MDM Rules/Calculators/A&P                          The patient is 65 years old.  She does have a history of hyperlipidemia, hypertension and diabetes.  She presented with approximately 9 hours of right-sided facial droop.  When I saw her, her deficit was almost imperceptible.  No other neurologic findings.  She was evaluated for evidence of CVA.  The  case was discussed with neurology.  The patient will be followed as an outpatient.  Of note, the patient symptoms do not seem to be related to a hypertensive emergency, Bell's palsy, or other neurologic event. Final Clinical Impression(s) / ED Diagnoses Final diagnoses:  Facial droop  Rx / DC Orders ED Discharge Orders    None       Arnaldo Natal, MD 06/16/20 2031

## 2020-06-18 ENCOUNTER — Ambulatory Visit: Payer: 59 | Admitting: Family Medicine

## 2020-06-20 ENCOUNTER — Other Ambulatory Visit: Payer: Self-pay

## 2020-06-20 ENCOUNTER — Ambulatory Visit: Payer: 59 | Admitting: Family Medicine

## 2020-06-20 ENCOUNTER — Other Ambulatory Visit: Payer: Self-pay | Admitting: Internal Medicine

## 2020-06-20 ENCOUNTER — Ambulatory Visit: Payer: 59 | Admitting: Internal Medicine

## 2020-06-20 ENCOUNTER — Encounter: Payer: Self-pay | Admitting: Family Medicine

## 2020-06-20 ENCOUNTER — Encounter: Payer: Self-pay | Admitting: Internal Medicine

## 2020-06-20 VITALS — BP 130/96 | HR 86 | Temp 98.3°F | Wt 208.0 lb

## 2020-06-20 VITALS — BP 126/86 | HR 97 | Ht 63.0 in | Wt 208.5 lb

## 2020-06-20 DIAGNOSIS — R635 Abnormal weight gain: Secondary | ICD-10-CM

## 2020-06-20 DIAGNOSIS — E876 Hypokalemia: Secondary | ICD-10-CM | POA: Diagnosis not present

## 2020-06-20 DIAGNOSIS — I10 Essential (primary) hypertension: Secondary | ICD-10-CM | POA: Diagnosis not present

## 2020-06-20 DIAGNOSIS — E1165 Type 2 diabetes mellitus with hyperglycemia: Secondary | ICD-10-CM

## 2020-06-20 DIAGNOSIS — R29810 Facial weakness: Secondary | ICD-10-CM

## 2020-06-20 DIAGNOSIS — E1169 Type 2 diabetes mellitus with other specified complication: Secondary | ICD-10-CM

## 2020-06-20 LAB — POCT GLYCOSYLATED HEMOGLOBIN (HGB A1C): Hemoglobin A1C: 8.9 % — AB (ref 4.0–5.6)

## 2020-06-20 MED ORDER — DEXCOM G6 TRANSMITTER MISC: 1.0000 | 3 refills | Status: DC

## 2020-06-20 MED ORDER — DEXCOM G6 SENSOR MISC: 1.0000 | 3 refills | Status: DC

## 2020-06-20 MED ORDER — GLIPIZIDE 10 MG PO TABS: ORAL_TABLET | ORAL | 3 refills | Status: DC

## 2020-06-20 MED ORDER — POTASSIUM CHLORIDE CRYS ER 20 MEQ PO TBCR: 20.0000 meq | EXTENDED_RELEASE_TABLET | Freq: Every day | ORAL | 0 refills | Status: DC

## 2020-06-20 NOTE — Patient Instructions (Addendum)
-   Change Glipizide 10 mg, 1 tablet before Breakfast and 2 tablets before Supper - Continue Metformin 1000 mg daily  - Continue Ozempic 1 mg weekly       HOW TO TREAT LOW BLOOD SUGARS (Blood sugar LESS THAN 70 MG/DL)  Please follow the RULE OF 15 for the treatment of hypoglycemia treatment (when your (blood sugars are less than 70 mg/dL)    STEP 1: Take 15 grams of carbohydrates when your blood sugar is low, which includes:   3-4 GLUCOSE TABS  OR  3-4 OZ OF JUICE OR REGULAR SODA OR  ONE TUBE OF GLUCOSE GEL     STEP 2: RECHECK blood sugar in 15 MINUTES STEP 3: If your blood sugar is still low at the 15 minute recheck --> then, go back to STEP 1 and treat AGAIN with another 15 grams of carbohydrates.

## 2020-06-20 NOTE — Progress Notes (Signed)
Subjective:    Patient ID: Laura Marsh, female    DOB: 1955/05/14, 65 y.o.   MRN: [id number]  No chief complaint on file.   HPI Patient was seen today for ED follow-up.  Patient was seen on 3/14 in ED for right-sided facial droop.  After negative work-up patient advised to follow-up outpatient with neurology.  Patient endorses near resolution of symptoms.  States notices mild right-sided facial droop first thing in the morning and with increased talking at work.  Typically improves by the end of day.  Patient notes dry eyes and occasional dry mouth at night, and chronic ankle edema.  Pt notes FSBS has been elevated at home.  BP elevated in ED and at home.  Drinking three 8 ounce glasses of water per day.  Patient notes facial edema since starting glipizide 3 years ago.  Patient denies joint pain.  Endorses family history of autoimmune disorders: Daughter-MS, sister-polymyositis, brother sarcoidosis, cousin-unsure, but has difficulty walking.  Past Medical History:  Diagnosis Date  . Colon polyp   . Diabetes mellitus   . Elevated cholesterol   . Hypertension     Allergies  Allergen Reactions  . Trulicity [Dulaglutide] Itching    Rash/itching at injection site    ROS General: Denies fever, chills, night sweats, changes in weight, changes in appetite HEENT: Denies headaches, ear pain, changes in vision, rhinorrhea, sore throat + dry eyes and mouth CV: Denies CP, palpitations, SOB, orthopnea Pulm: Denies SOB, cough, wheezing GI: Denies abdominal pain, nausea, vomiting, diarrhea, constipation GU: Denies dysuria, hematuria, frequency, vaginal discharge Msk: Denies muscle cramps, joint pains Neuro: Denies weakness, numbness, tingling  + right-sided facial droop Skin: Denies rashes, bruising  + facial edema, ankle edema Psych: Denies depression, anxiety, hallucinations    Objective:    Blood pressure (!) 130/96, pulse 86, temperature 98.3 F (36.8 C), temperature source Oral,  weight 208 lb (94.3 kg), SpO2 93 %.  Gen. Pleasant, well-nourished, in no distress, normal affect   HEENT: [place]/AT, face symmetric, conjunctiva clear, no scleral icterus, PERRLA, EOMI, nares patent without drainage, no deviation of tongue Lungs: no accessory muscle use Cardiovascular: RRR, no peripheral edema Musculoskeletal: Normal strength in neck.  No deformities, no cyanosis or clubbing, normal tone Neuro:  A&Ox3, CN II-XII intact, normal gait.  Face symmetric.  No facial droop appreciated smiling or frowning.  Mild right sided droop of mouth with frowning.  No deviation of tongue.  Intact sensation on face to soft and sharp touch. Skin:  Warm, no lesions/ rash   Wt Readings from Last 3 Encounters:  06/20/20 208 lb (94.3 kg)  06/16/20 200 lb (90.7 kg)  04/28/20 198 lb (89.8 kg)    Lab Results  Component Value Date   WBC 8.0 06/16/2020   HGB 13.6 06/16/2020   HCT 40.0 06/16/2020   PLT 317 06/16/2020   GLUCOSE 149 (H) 06/16/2020   CHOL 156 12/07/2019   TRIG 71 12/07/2019   HDL 57 12/07/2019   LDLDIRECT 140.3 02/19/2013   LDLCALC 84 12/07/2019   ALT 19 06/16/2020   AST 15 06/16/2020   NA 142 06/16/2020   K 3.2 (L) 06/16/2020   CL 104 06/16/2020   CREATININE 0.60 06/16/2020   BUN 11 06/16/2020   CO2 27 06/16/2020   TSH 0.36 12/06/2018   INR 1.0 06/16/2020   HGBA1C 9.0 (A) 11/06/2019   MICROALBUR <0.7 05/09/2019    Assessment/Plan:  Facial droop -Work-up in ED negative including CT and MRI of brain on  06/16/2020 -Hypokalemia noted.  Calcium normal. -We will place referral for neurology - Plan: Ambulatory referral to Neurology  Essential hypertension -Elevated -Discussed lifestyle modifications including decreasing sodium intake and increasing p.o. intake of water and physical activity -Continue current medications -We will have patient follow-up in 2-4 weeks after increasing p.o. intake of water.  If BP remains elevated will adjust medication  Type 2 diabetes  mellitus with other specified complication, without long-term current use of insulin (HCC) -Uncontrolled -Hemoglobin A1c 9.0% on 11/06/2019 -Reviewed diet modifications including decreasing carbs, increasing physical activity, decreasing portion sizes -Continue current medications -Patient has appointment today with endocrinology advised to keep  Hypokalemia -Potassium 3.3 and 3.2 on 06/16/2020 in ED -Likely 2/2 concomitant use of triamterene-hydrochlorothiazide and lisinopril -We will send in potassium replacement this does not appear to be visible on chart. -Plan: K-Dur 20 milliequivalents  F/u in 2-4 weeks, sooner if needed  Grier Mitts, MD

## 2020-06-20 NOTE — Progress Notes (Signed)
Name: Laura Marsh  Age/ Sex: 65 y.o., female   MRN/ DOB: [id number], Mar 31, 1956     PCP: Billie Ruddy, MD   Reason for Endocrinology Evaluation: Type 2 Diabetes Mellitus     Initial Endocrinology Clinic Visit: 06/16/2018    PATIENT IDENTIFIER: Laura Marsh is a 65 y.o. female with a past medical history of HTN, T2DM and Dyslipidemia. The patient has followed with Endocrinology clinic since 06/16/2018 for consultative assistance with management of her diabetes.  DIABETIC HISTORY:  Laura Marsh was diagnosed with T2DM ~ 2014. She has been on oral glycemic agents for years. She has never been on insulin therapy. Her hemoglobin A1c has ranged from 6.5% in 2014, peaking at 11.0% in 2019.   On her initial visit to our clinic she was on Metformin, Glipizide and Januvia, with an A1c 9.7 %.  We stopped Januvia and glipizide and started Ozempic with continuation of Metformin    [facility]  SUBJECTIVE:   During the last visit (02/08/2020): A1c 7.5 % . We continued Metformin, and glipizide, and increased Ozempic      Today (06/20/2020): Laura Marsh is here for a follow up on diabetes follow up.  She checks her blood sugars occasionally. The patient has not had hypoglycemic episodes since the last clinic visit.         HOME DIABETES REGIMEN:   Metformin 1000 mg Once daily  Glipizide 10 mg BID with meals  Ozempic 1 mg weekly   METER DOWNLOAD SUMMARY: 3/4- 06/20/2020 Average Number Tests/Day =0.6 Overall Mean FS Glucose = 197   BG Ranges: Low = 102 High = 238   Hypoglycemic Events/30 Days: BG < 50 = 0 Episodes of symptomatic severe hypoglycemia = 0     HISTORY:  Past Medical History:  Past Medical History:  Diagnosis Date  . Colon polyp   . Diabetes mellitus   . Elevated cholesterol   . Hypertension    Past Surgical History:  Past Surgical History:  Procedure Laterality Date  . ABDOMINAL HYSTERECTOMY     . COLONOSCOPY    . COLONOSCOPY WITH PROPOFOL N/A 11/08/2017   Procedure: COLONOSCOPY WITH PROPOFOL;  Surgeon: Toledo, Benay Pike, MD;  Location: [facility] ENDOSCOPY;  Service: Gastroenterology;  Laterality: N/A;  . COLONOSCOPY WITH PROPOFOL N/A 04/28/2020   Procedure: COLONOSCOPY WITH PROPOFOL;  Surgeon: Benjamine Sprague, DO;  Location: [facility] ENDOSCOPY;  Service: General;  Laterality: N/A;    Social History:  reports that she has never smoked. She has never used smokeless tobacco. She reports current alcohol use. She reports that she does not use drugs. Family History:  Family History  Problem Relation Age of Onset  . Diabetes Mother   . Congestive Heart Failure Mother      HOME MEDICATIONS: Allergies as of 06/20/2020      Reactions   Trulicity [dulaglutide] Itching   Rash/itching at injection site      Medication List       Accurate as of June 20, 2020  1:11 PM. If you have any questions, ask your nurse or doctor.        STOP taking these medications   Accu-Chek Guide w/Device Kit Stopped by: Dorita Sciara, MD   Trulicity 1.5 EX/5.2WU Sopn Generic drug: Dulaglutide Stopped by: Billie Ruddy, MD     TAKE these medications   Alcohol Swabs Pads Use as directed.   amLODipine 5 MG tablet Commonly  known as: NORVASC TAKE 1 TABLET BY MOUTH DAILY.   atorvastatin 20 MG tablet Commonly known as: LIPITOR TAKE 1 TABLET BY MOUTH DAILY   fluticasone 50 MCG/ACT nasal spray Commonly known as: FLONASE Place 2 sprays into both nostrils daily.   FreeStyle Libre 2 Sensor Misc 1 Device by Does not apply route as directed.   FREESTYLE LITE test strip Generic drug: glucose blood USE TO TEST BLOOD SUGAR TWO TIMES DAILY   glipiZIDE 10 MG tablet Commonly known as: GLUCOTROL TAKE 1 TABLET BY MOUTH 2 TIMES DAILY BEFORE A MEAL   lisinopril 30 MG tablet Commonly known as: ZESTRIL TAKE 1 TABLET BY MOUTH ONCE DAILY   loratadine 10 MG tablet Commonly known as: CLARITIN Take 10 mg  by mouth daily.   metFORMIN 1000 MG tablet Commonly known as: GLUCOPHAGE Take 1 tablet (1,000 mg total) by mouth daily with breakfast.   multivitamin with minerals Tabs tablet Take 1 tablet by mouth daily.   Ozempic (1 MG/DOSE) 4 MG/3ML Sopn Generic drug: Semaglutide (1 MG/DOSE) INJECT 1 MG INTO THE SKIN ONCE A WEEK.   potassium chloride SA 20 MEQ tablet Commonly known as: KLOR-CON Take 1 tablet (20 mEq total) by mouth daily. Started by: Billie Ruddy, MD   triamcinolone ointment 0.1 % Commonly known as: KENALOG   triamterene-hydrochlorothiazide 37.5-25 MG tablet Commonly known as: MAXZIDE-25 TAKE 1 TABLET BY MOUTH ONCE DAILY     PHYSICAL EXAM: VS: BP 126/86   Pulse 97   Ht $R'5\' 3"'CB$  (1.6 m)   Wt 208 lb 8 oz (94.6 kg)   SpO2 96%   BMI 36.93 kg/m    EXAM: General: Pt appears well and is in NAD  Lungs: Clear with good BS bilat with no rales, rhonchi, or wheezes  Heart: Auscultation: RRR with normal S1 and S2, no gallops or murmurs Periph. circulation: no peripheral edema  Abdomen: Normoactive bowel sounds, soft, nontender, without masses or organomegaly palpable  Mental Status: Judgment, insight: intact Orientation: oriented to time, place, and person Mood and affect: no depression, anxiety, or agitation    DM Foot exam 06/20/2020 The skin of the feet is intact without sores or ulcerations. The pedal pulses are 2+ on right and 2+ on left. The sensation is intact to a screening 5.07, 10 gram monofilament bilaterally    DATA REVIEWED:  Lab Results  Component Value Date   HGBA1C 8.9 (A) 06/20/2020   HGBA1C 9.0 (A) 11/06/2019   HGBA1C 7.5 (A) 05/09/2019   Lab Results  Component Value Date   MICROALBUR <0.7 05/09/2019   LDLCALC 84 12/07/2019   CREATININE 0.60 06/16/2020   Lab Results  Component Value Date   MICRALBCREAT 1.0 05/09/2019     Lab Results  Component Value Date   CHOL 156 12/07/2019   HDL 57 12/07/2019   LDLCALC 84 12/07/2019   LDLDIRECT  140.3 02/19/2013   TRIG 71 12/07/2019   CHOLHDL 2.7 12/07/2019         ASSESSMENT / PLAN / RECOMMENDATIONS:   1) Type 2 Diabetes Mellitus, Poorly controlled, Without complications - Most recent A1c of 8.9 %. Goal A1c < 7.0 %.     - She continues with hyperglycemia  - She has been taking Glipizide 1 hr prior to breakfast, I have encouraged her to take it 15-20 minutes before Breakfast - She is intolerant to higher doses of metformin - We  discussed SGLT-2 inhibitors again but she is recluctant , discussed risk of genital infections as well  as cardiovascular and weight benefits, would like to think about it  - IN the meantime will increase Glipizide - She has declined RD referral in the past , which will be helpful to her, as I could see evidence of dietary indiscretions on her meter download ( BG's range from 102 to > 200) same time phrase of the day of the week  - Will make the following changes    MEDICATIONS:  Continue Metformin 1000 mg Once daily  Increase Glipizide 10 mg, 1 tablet before Breakfast and 2 tablets before Supper  Continue Ozempic 1 mg daily       EDUCATION / INSTRUCTIONS:  BG monitoring instructions: Patient is instructed to check her blood sugars 2 times a day, fasting and bedtime.  Call [facility] if: BG persistently < 70 or > 300. . I reviewed the Rule of 15 for the treatment of hypoglycemia in detail with the patient. Literature supplied.  2) Diabetic complications:   Eye: Does not have known diabetic retinopathy.   Neuro/ Feet: Does not have known diabetic peripheral neuropathy.  Renal: Patient does not have known baseline CKD. She is  on an ACEI/ARB at present.  Micro albumin urea testing is normal and up-to-date  3) Hypokalemia / Weight gain :   - She has not picked up the prescription of KCL prescribed by PCP yet, we discussed screening for cushing syndrome   I spent 25 minutes preparing to see the patient by review of  recent labs, imaging and procedures, obtaining and reviewing separately obtained history, communicating with the patient, ordering medications, and documenting clinical information in the EHR including the differential Dx, treatment, and any further evaluation and other management    F/U in 4 months    Signed electronically by: Mack Guise, MD  [facility]  [facility] [address] [place], [place] [address] Phone: [phone] FAX: [phone]   CC: Billie Ruddy, [address] [place] [address] Phone: [phone]  Fax: [phone]  Return to Endocrinology clinic as below: Future Appointments  Date Time Provider [place]  07/04/2020  1:00 PM Billie Ruddy, MD [facility]  12/12/2020  9:00 AM Billie Ruddy, MD [facility]

## 2020-06-24 ENCOUNTER — Encounter: Payer: Self-pay | Admitting: Neurology

## 2020-06-25 MED FILL — glipiZIDE 10 MG TABS: 10 | 90 days supply | Qty: 270 | Fill #0

## 2020-06-30 ENCOUNTER — Other Ambulatory Visit: Payer: Self-pay | Admitting: Internal Medicine

## 2020-06-30 MED FILL — OZEMPIC (1 MG/DOSE) 4 MG/3M: 4 | 28 days supply | Qty: 3 | Fill #0

## 2020-07-04 ENCOUNTER — Ambulatory Visit: Payer: 59 | Admitting: Family Medicine

## 2020-07-04 ENCOUNTER — Other Ambulatory Visit: Payer: Self-pay

## 2020-07-04 ENCOUNTER — Encounter: Payer: Self-pay | Admitting: Family Medicine

## 2020-07-04 VITALS — BP 122/86 | HR 100 | Temp 98.3°F | Wt 205.4 lb

## 2020-07-04 DIAGNOSIS — I10 Essential (primary) hypertension: Secondary | ICD-10-CM

## 2020-07-04 DIAGNOSIS — E876 Hypokalemia: Secondary | ICD-10-CM

## 2020-07-04 DIAGNOSIS — E1169 Type 2 diabetes mellitus with other specified complication: Secondary | ICD-10-CM

## 2020-07-04 NOTE — Progress Notes (Signed)
Subjective:    Patient ID: Laura Marsh, female    DOB: [birth year], 65 y.o.   MRN: [id number]  Chief Complaint  Patient presents with  . Follow-up    HPI Patient was seen today for follow-up on HTN.  BP was 140/96 during OFV on 06/20/2020.  BP has since comedown to 120s/80s. Pt drinking more water.  Trying to eat less sweets and cut down on sodas.  Lost 3 lbs, but would like to to lose 20 pounds.  Patient has not started taking Klor-Con 20 mEq for hypokalemia as she states the pills were too large.  Patient seen by endocrinology.  Glipizide increased.  A1c was 8.9%.  Past Medical History:  Diagnosis Date  . Colon polyp   . Diabetes mellitus   . Elevated cholesterol   . Hypertension     Allergies  Allergen Reactions  . Trulicity [Dulaglutide] Itching    Rash/itching at injection site    ROS General: Denies fever, chills, night sweats, changes in weight, changes in appetite HEENT: Denies headaches, ear pain, changes in vision, rhinorrhea, sore throat CV: Denies CP, palpitations, SOB, orthopnea Pulm: Denies SOB, cough, wheezing GI: Denies abdominal pain, nausea, vomiting, diarrhea, constipation GU: Denies dysuria, hematuria, frequency, vaginal discharge Msk: Denies muscle cramps, joint pains Neuro: Denies weakness, numbness, tingling Skin: Denies rashes, bruising Psych: Denies depression, anxiety, hallucinations    Objective:    Blood pressure 122/86, pulse 100, temperature 98.3 F (36.8 C), temperature source Oral, weight 205 lb 6.4 oz (93.2 kg), SpO2 95 %.  Gen. Pleasant, well-nourished, in no distress, normal affect   HEENT: [place]/AT, face symmetric, conjunctiva clear, no scleral icterus, PERRLA, EOMI, nares patent without drainage Lungs: no accessory muscle use Cardiovascular: RRR, no peripheral edema Musculoskeletal: No deformities, no cyanosis or clubbing, normal tone Neuro:  A&Ox3, CN II-XII intact, normal gait Skin:  Warm, no lesions/ rash  Wt Readings from Last  3 Encounters:  07/04/20 205 lb 6.4 oz (93.2 kg)  06/20/20 208 lb 8 oz (94.6 kg)  06/20/20 208 lb (94.3 kg)    Lab Results  Component Value Date   WBC 8.0 06/16/2020   HGB 13.6 06/16/2020   HCT 40.0 06/16/2020   PLT 317 06/16/2020   GLUCOSE 149 (H) 06/16/2020   CHOL 156 12/07/2019   TRIG 71 12/07/2019   HDL 57 12/07/2019   LDLDIRECT 140.3 02/19/2013   LDLCALC 84 12/07/2019   ALT 19 06/16/2020   AST 15 06/16/2020   NA 142 06/16/2020   K 3.2 (L) 06/16/2020   CL 104 06/16/2020   CREATININE 0.60 06/16/2020   BUN 11 06/16/2020   CO2 27 06/16/2020   TSH 0.36 12/06/2018   INR 1.0 06/16/2020   HGBA1C 8.9 (A) 06/20/2020   MICROALBUR <0.7 05/09/2019    Assessment/Plan:  Essential hypertension -Controlled -Continue current medications including Norvasc 5 mg, lisinopril 30 mg, triamterene-hydrochlorothiazide 37.5-25 mg daily -Patient encouraged to start Klor-Con 20 mEq daily  Type 2 diabetes mellitus with other specified complication, without long-term current use of insulin (HCC) -Hemoglobin A1c 8.9% on 06/20/2020 -Discussed the importance of lifestyle modifications  Hypokalemia -Potassium 3.2 on 06/16/2020 -pt encouraged to start taking Klor-Con 20 mEq daily -Continue current medications including Ozempic, glipizide, and Metformin. -Continue follow-up with endocrinology, Dr. Kelton Pillar  F/u in 3-4 months  Grier Mitts, MD

## 2020-07-08 ENCOUNTER — Encounter: Payer: Self-pay | Admitting: Family Medicine

## 2020-07-11 ENCOUNTER — Other Ambulatory Visit (HOSPITAL_COMMUNITY): Payer: Self-pay

## 2020-07-11 ENCOUNTER — Encounter: Payer: Self-pay | Admitting: Family Medicine

## 2020-07-11 ENCOUNTER — Other Ambulatory Visit: Payer: Self-pay

## 2020-07-11 ENCOUNTER — Ambulatory Visit: Payer: 59 | Admitting: Family Medicine

## 2020-07-11 VITALS — BP 124/80 | HR 86 | Temp 98.4°F

## 2020-07-11 DIAGNOSIS — R040 Epistaxis: Secondary | ICD-10-CM

## 2020-07-11 DIAGNOSIS — H04129 Dry eye syndrome of unspecified lacrimal gland: Secondary | ICD-10-CM | POA: Diagnosis not present

## 2020-07-11 MED FILL — Lisinopril Tab 30 MG: ORAL | 90 days supply | Qty: 90 | Fill #0 | Status: AC

## 2020-07-11 MED FILL — Atorvastatin Calcium Tab 20 MG (Base Equivalent): ORAL | 90 days supply | Qty: 90 | Fill #0 | Status: AC

## 2020-07-11 NOTE — Patient Instructions (Addendum)
Hold the Aspirin for the next few days.  You can then restart it. Take a break from using Flonase.  You can use a nasal saline rinse if needed which can be found over the counter.  Ayr nasal gel can be found over-the-counter.  He can provide moisture to help prevent bleeding.   Nosebleed, Adult A nosebleed is when blood comes out of the nose. Nosebleeds are common. Usually, they are not a sign of a serious condition. Nosebleeds can happen if a blood vessel in your nose starts to bleed or if the lining of your nose (mucous membrane) cracks. They are commonly caused by:  Allergies.  Colds.  Picking your nose.  Blowing your nose too hard.  An injury from sticking an object into your nose or getting hit in the nose.  Dry or cold air. Less common causes of nosebleeds include:  Toxic fumes.  Something abnormal in the nose or in the air-filled spaces in the bones of the face (sinuses).  Growths in the nose, such as polyps.  Blood thinners or conditions that cause blood to clot slowly.  Certain illnesses or procedures that irritate or dry out the nasal passages. Follow these instructions at home: When you have a nosebleed:  Sit down and tilt your head slightly forward.  Use a clean towel or tissue to pinch your nostrils under the bony part of your nose. After 5 minutes, let go of your nose and see if bleeding starts again. Do not release pressure before that time. If there is still bleeding, repeat the pinching and holding for 5 minutes or until the bleeding stops.  Do not place tissues or gauze in the nose to stop the bleeding.  Avoid lying down and avoid tilting your head backward. That may make blood collect in the throat and cause gagging or coughing.  Use a nasal spray decongestant to help with a nosebleed as told by your health care provider.   After a nosebleed:  Avoid blowing your nose or sniffing for a number of hours.  Avoid straining, lifting, or bending at the  waist for several days. You may go back to other normal activities as you are able.  If you are taking aspirin or blood thinners and you have nosebleeds, talk to your health care provider. These medicines make bleeding more likely. ? Ask your health care provider if you should stop taking the medicines or if you should adjust the dose. ? Do not stop taking medicines that your health care provider has recommended unless he or she tells you to stop taking them.  If your nosebleed was caused by dry mucous membranes, use over-the-counter saline nasal spray or gel and a humidifier as told by your health care provider. This will keep the mucous membranes moist and allow them to heal. If you need to use one of these products: ? Choose one that is water-soluble. ? Use only as much as you need and use it only as often as needed. ? Do not lie down right after you use it.  If you get nosebleeds often, talk with your health care provider about medical treatments. Options may include: ? Nasal cautery. This treatment stops and prevents nosebleeds by using a chemical swab or electrical device to lightly burn tiny blood vessels inside the nose. ? Nasal packing. A gauze or other material is placed in the nose to keep constant pressure on the bleeding area. Contact a health care provider if you:  Have a fever.  Get nosebleeds often or more often than usual.  Bruise very easily.  Have a nosebleed from having something stuck in your nose.  Have bleeding in your mouth.  Vomit or cough up brown material.  Have a nosebleed after you start a new medicine. Get help right away if:  You have a nosebleed after a fall or a head injury.  Your nosebleed does not go away after 20 minutes.  You feel dizzy or weak.  You have unusual bleeding from other parts of your body.  You have unusual bruising on other parts of your body.  You become sweaty.  You vomit blood. Summary  A nosebleed is when blood comes  out of the nose. Common causes include allergies, an injury to the nose, or cold or dry air.  Initial treatment includes applying pressure for 5 minutes.  Moisturizing the nose with saline nasal spray or gel after a nosebleed may help prevent future bleeding.  Get help right away if your nosebleed does not go away after 20 minutes. This information is not intended to replace advice given to you by your health care provider. Make sure you discuss any questions you have with your health care provider. Document Revised: 01/18/2019 Document Reviewed: 01/18/2019 Elsevier Patient Education  2021 [place].  Dry Eye  Dry eye, also called keratoconjunctivitis sicca, is dryness of the membranes surrounding the eye. It happens when there are not enough healthy, natural tears in the eyes. The eyes must remain moist at all times. A small amount of tears is constantly produced by the tear glands (lacrimal glands). These glands are located under the outside part of the upper eyelids. Dry eye can happen on its own or be a symptom of several conditions, such as rheumatoid arthritis, lupus, or Sjgren's syndrome. Dry eye may be mild to severe. What are the causes? This condition may be caused by:  Not making enough tears (aqueous tear-deficient dry eyes).  Tears evaporating from the eyes too quickly (evaporative dry eyes). This is when there is an abnormality in the quality of your tears. This abnormality causes your tears to evaporate so quickly that the eyes cannot be kept moist. What increases the risk? You are more likely to develop this condition if you:  Are a woman, especially if you have gone through menopause.  Live in a dry climate.  Live in a dusty or smoky area.  Take certain medicines, such as: ? Anti-allergy medicines (antihistamines). ? Blood pressure medicines (antihypertensives). ? Birth control pills (oral contraceptives). ? Laxatives. ? Tranquilizers.  Have a history of  refractive eye surgery, such as LASIK.  Have a history of long-term contact lens use. What are the signs or symptoms? Symptoms of this condition include:  Irritation.  Itchiness.  Redness.  Burning.  Inflammation of the eyelids.  Feeling as though something is stuck in the eye.  Light sensitivity.  Increased sensitivity and discomfort when wearing contact lenses.  Vision that varies throughout the day.  Occasional excessive tearing.   How is this diagnosed? This condition is diagnosed based on your symptoms, your medical history, and an eye exam.  Your health care provider may look at your eye using a microscope and may put dyes in your eye to check the health of the surface of your eye.  You may have tests, such as a test to evaluate your tear production (Schirmer test). ? During this test, a small strip of special paper is gently pressed into the inner corner of  your eye. ? Your tear production is measured by how much of the paper is moistened by your tears during a set amount of time. You may be referred to a health care provider who specializes in eyes and eyesight (ophthalmologist). How is this treated? Treatment for this condition depends on the severity. Mild cases are often treated at home. To help relieve your symptoms, your health care provider may recommend eye drops, which are also called artificial tears.  If your condition is severe, treatment may include: ? Prescription eye drops. ? Over-the-counter or prescription ointments to moisten your eyes. ? Minor surgery to place plugs into the tear ducts. This keep tears from draining so that tears can stay on the surface of the eye longer. ? Medicines to reduce inflammation of the eyelids. ? Taking an omega-3 fatty acid nutritional supplement. Follow these instructions at home:  Take or apply over-the-counter and prescription medicines only as told by your health care provider. This includes eye drops.  If  directed, apply a warm compress to your eyes to help reduce inflammation. Place a towel over your eyes and gently press the warm compress over your eyes for about 5 minutes, or as long as told by your health care provider.  Drink plenty of fluids to stay well hydrated.  If possible, avoid dry, drafty environments.  Wear sunglasses when outdoors to protect your eyes from the sun and wind.  Use a humidifier at home to increase moisture in the air.  Remember to blink often when reading or using the computer for long periods.  If you wear contact lenses, remove them regularly to give your eyes a break. Always remove contacts before sleeping.  Have a yearly eye exam and vision test.  Keep all follow-up visits as told by your health care provider. This is important. Contact a health care provider if:  You have eye pain.  You have pus-like fluid coming from your eye.  Your symptoms get worse or do not improve with treatment. Get help right away if:  Your vision suddenly changes. Summary  Dry eye is dryness of the membranes surrounding the eye.  Dry eye can happen on its own or be a symptom of several conditions, such as rheumatoid arthritis, lupus, or Sjgren's syndrome.  This condition is diagnosed based on your symptoms, your medical history, and an eye exam.  Treatment for this condition depends on the severity. Mild cases are often treated at home. To help relieve your symptoms, your health care provider may recommend eye drops, which are also called artificial tears. This information is not intended to replace advice given to you by your health care provider. Make sure you discuss any questions you have with your health care provider. Document Revised: 09/13/2017 Document Reviewed: 09/13/2017 Elsevier Patient Education  [place].

## 2020-07-11 NOTE — Progress Notes (Signed)
Subjective:    Patient ID: Laura Marsh, female    DOB: Apr 28, 1955, 65 y.o.   MRN: [id number]  No chief complaint on file.   HPI Patient is a 65 yo female with pmh sig for HTN, DM, HLD, deQuervains tenosynovitis who was seen today for acute concern.  Patient endorses epistaxis earlier this week after blowing her nose. At the advice of her sister, who is a Marine scientist, she pinched her nose and held her head backwards for several minutes.  Had a quarter sized blood clot in her mouth.  Bleeding resolved but started again later.  Patient using Flonase nasal spray daily and taking ASA.  Also notes R eye feeling dry at night and first thing in am.  Using artificial tears.  Past Medical History:  Diagnosis Date  . Colon polyp   . Diabetes mellitus   . Elevated cholesterol   . Hypertension     Allergies  Allergen Reactions  . Trulicity [Dulaglutide] Itching    Rash/itching at injection site    ROS General: Denies fever, chills, night sweats, changes in weight, changes in appetite HEENT: Denies headaches, ear pain, changes in vision, rhinorrhea, sore throat  +epistaxis, dry R eye CV: Denies CP, palpitations, SOB, orthopnea Pulm: Denies SOB, cough, wheezing GI: Denies abdominal pain, nausea, vomiting, diarrhea, constipation GU: Denies dysuria, hematuria, frequency, vaginal discharge Msk: Denies muscle cramps, joint pains Neuro: Denies weakness, numbness, tingling Skin: Denies rashes, bruising Psych: Denies depression, anxiety, hallucinations     Objective:    Blood pressure 124/80, pulse 86, temperature 98.4 F (36.9 C), temperature source Oral, SpO2 95 %.  Gen. Pleasant, well-nourished, in no distress, normal affect  HEENT: [place]/AT, face symmetric, conjunctiva clear, no scleral icterus, PERRLA, EOMI, nares patent without drainage.  Erythema and dried blood present , pharynx without erythema or exudate Lungs: no accessory muscle use Cardiovascular: RRR, no peripheral  edema Musculoskeletal: No deformities, no cyanosis or clubbing, normal tone Neuro:  A&Ox3, CN II-XII intact, normal gait Skin:  Warm, no lesions/ rash  Wt Readings from Last 3 Encounters:  07/04/20 205 lb 6.4 oz (93.2 kg)  06/20/20 208 lb 8 oz (94.6 kg)  06/20/20 208 lb (94.3 kg)    Lab Results  Component Value Date   WBC 8.0 06/16/2020   HGB 13.6 06/16/2020   HCT 40.0 06/16/2020   PLT 317 06/16/2020   GLUCOSE 149 (H) 06/16/2020   CHOL 156 12/07/2019   TRIG 71 12/07/2019   HDL 57 12/07/2019   LDLDIRECT 140.3 02/19/2013   LDLCALC 84 12/07/2019   ALT 19 06/16/2020   AST 15 06/16/2020   NA 142 06/16/2020   K 3.2 (L) 06/16/2020   CL 104 06/16/2020   CREATININE 0.60 06/16/2020   BUN 11 06/16/2020   CO2 27 06/16/2020   TSH 0.36 12/06/2018   INR 1.0 06/16/2020   HGBA1C 8.9 (A) 06/20/2020   MICROALBUR <0.7 05/09/2019    Assessment/Plan:  Epistaxis -Resolved -Likely 2/2 from daily Flonase use.  Concomitant aspirin use likely cause bleeding to be prolonged -Discussed taking a break from flonase use.   -Saline nasal rinse if needed -Okay to use OTC [place] nasal gel -Can restart aspirin 81 mg in the next few days -Reviewed proper technique to control nose bleeds -Given handout  Dry eye -Discussed possible causes including seasonal allergies -Okay to use lubricating eyedrops as needed -For continued or worsened symptoms obtain labs to rule out autoimmune cause  F/u prn  Grier Mitts, MD

## 2020-07-14 ENCOUNTER — Telehealth: Payer: Self-pay | Admitting: Internal Medicine

## 2020-07-14 NOTE — Telephone Encounter (Signed)
Completed and fax to [facility] at [phone]

## 2020-07-14 NOTE — Telephone Encounter (Signed)
[employer] called to check if we received paperwork for pt's PA- relayed that we do have paperwork and is waiting to be completed. [employer] states they will call back Wednesday to check the status of the PA

## 2020-07-24 ENCOUNTER — Other Ambulatory Visit (HOSPITAL_COMMUNITY): Payer: Self-pay

## 2020-07-24 MED ORDER — DEXCOM G6 TRANSMITTER MISC
3 refills | Status: DC
  Filled 2020-07-24: qty 1, 90d supply, fill #0

## 2020-07-24 MED ORDER — DEXCOM G6 SENSOR MISC
11 refills | Status: DC
  Filled 2020-07-24: qty 3, 30d supply, fill #0
  Filled 2020-10-24: qty 3, 30d supply, fill #1
  Filled 2021-04-17: qty 3, 30d supply, fill #2

## 2020-07-25 ENCOUNTER — Other Ambulatory Visit (HOSPITAL_COMMUNITY): Payer: Self-pay

## 2020-07-26 ENCOUNTER — Other Ambulatory Visit (HOSPITAL_COMMUNITY): Payer: Self-pay

## 2020-07-26 ENCOUNTER — Other Ambulatory Visit: Payer: Self-pay | Admitting: Internal Medicine

## 2020-07-28 ENCOUNTER — Other Ambulatory Visit: Payer: Self-pay | Admitting: Internal Medicine

## 2020-07-28 ENCOUNTER — Other Ambulatory Visit (HOSPITAL_COMMUNITY): Payer: Self-pay

## 2020-07-28 MED ORDER — OZEMPIC (1 MG/DOSE) 4 MG/3ML ~~LOC~~ SOPN
PEN_INJECTOR | SUBCUTANEOUS | 0 refills | Status: DC
  Filled 2020-07-28: qty 3, 28d supply, fill #0

## 2020-08-01 ENCOUNTER — Other Ambulatory Visit (HOSPITAL_COMMUNITY): Payer: Self-pay

## 2020-08-01 MED FILL — Triamterene & Hydrochlorothiazide Tab 37.5-25 MG: ORAL | 30 days supply | Qty: 30 | Fill #0 | Status: AC

## 2020-08-15 ENCOUNTER — Other Ambulatory Visit (HOSPITAL_COMMUNITY): Payer: Self-pay

## 2020-08-15 MED FILL — Metformin HCl Tab 1000 MG: ORAL | 90 days supply | Qty: 90 | Fill #0 | Status: AC

## 2020-08-25 ENCOUNTER — Other Ambulatory Visit (HOSPITAL_COMMUNITY): Payer: Self-pay

## 2020-08-25 ENCOUNTER — Other Ambulatory Visit: Payer: Self-pay | Admitting: Internal Medicine

## 2020-08-25 ENCOUNTER — Other Ambulatory Visit: Payer: Self-pay | Admitting: Family Medicine

## 2020-08-26 ENCOUNTER — Other Ambulatory Visit (HOSPITAL_COMMUNITY): Payer: Self-pay

## 2020-08-26 MED ORDER — OZEMPIC (1 MG/DOSE) 4 MG/3ML ~~LOC~~ SOPN
PEN_INJECTOR | SUBCUTANEOUS | 0 refills | Status: DC
  Filled 2020-08-26: qty 3, 28d supply, fill #0

## 2020-08-26 MED ORDER — AMLODIPINE BESYLATE 5 MG PO TABS
ORAL_TABLET | Freq: Every day | ORAL | 2 refills | Status: DC
  Filled 2020-08-26: qty 90, 90d supply, fill #0
  Filled 2020-11-20: qty 90, 90d supply, fill #1
  Filled 2021-02-18: qty 90, 90d supply, fill #2

## 2020-08-26 NOTE — Progress Notes (Signed)
NEUROLOGY CONSULTATION NOTE  Laura Marsh MRN: [id number] DOB: [birth year]  Referring provider: Grier Mitts, MD Primary care provider: Grier Mitts, MD  Reason for consult:  Facial droop  Assessment/Plan:   1.  Recurrent right sided facial droop, sparing forehead making facial nerve palsy less likely.  As symptoms have been recurrent, TIA less likely unless there is a focal arterial stenosis (such as carotid artery stenosis).  Low suspicion for migraine or seizure.  As she has stroke risk factors, I would er on the side of caution and optimize management of those risk factors (HTN, HLD, type 2 diabetes)  1.  Will check carotid ultrasound to evaluate for potential significant left ICA stenosis 2.  Recommend ASA 81mg  daily.  If unable to tolerate (nosebleed), then at least twice a week. 3.  Optimize glycemic control 4.  Continue statin therapy 5.  Normotensive blood pressure 6.  Further recommendations pending results.      Subjective:  Laura Marsh is a 65 year old right-handed female with diabetes and HTN who presents for facial droop.  History supplemented by ED and referring provider's notes.  On 06/16/2020 she was at work and noticed that the right side of her mouth felt droopy with some mild drool.  No involvement of forehead.  No associated facial numbness, headache, slurred speech, visual disturbance or unilateral weakness or numbness of extremities.  Notes some dry eye but no difficulty closing her eye.  She went to the ED where CT and MRI of brain without contrast, personally reviewed, was performed and was normal.  Blood pressure was elevated (154/105).  Symptoms lasted about 4 hours.  She started taking ASA 81mg  daily but reduced frequency to twice a day due to nose bleed.  Since then, she said it has happened again, usually a sensation while wearing her facemask at work.  She did start taking ASA 81mg  daily but reduced frequency to twice a day due to nosebleed.     Denies history of stroke or migraine.  Comorbidies include uncontrolled type 2 diabetes (Hgb A1c from 06/20/2020 was 8.2), hypertension and hyperlipidemia.  LDL from September 2021 was 84.  Family history noted for:  Daughter (MS), sister (polymyositis), brother (sarcoidosis).   PAST MEDICAL HISTORY: Past Medical History:  Diagnosis Date  . Colon polyp   . Diabetes mellitus   . Elevated cholesterol   . Hypertension     PAST SURGICAL HISTORY: Past Surgical History:  Procedure Laterality Date  . ABDOMINAL HYSTERECTOMY    . COLONOSCOPY    . COLONOSCOPY WITH PROPOFOL N/A 11/08/2017   Procedure: COLONOSCOPY WITH PROPOFOL;  Surgeon: Toledo, Benay Pike, MD;  Location: [facility] ENDOSCOPY;  Service: Gastroenterology;  Laterality: N/A;  . COLONOSCOPY WITH PROPOFOL N/A 04/28/2020   Procedure: COLONOSCOPY WITH PROPOFOL;  Surgeon: Benjamine Sprague, DO;  Location: [facility] ENDOSCOPY;  Service: General;  Laterality: N/A;    MEDICATIONS: Current Outpatient Medications on File Prior to Visit  Medication Sig Dispense Refill  . Alcohol Swabs PADS Use as directed. 100 each 12  . amLODipine (NORVASC) 5 MG tablet TAKE 1 TABLET BY MOUTH DAILY. 90 tablet 0  . aspirin 81 MG EC tablet Take 81 mg by mouth daily. Swallow whole.    Marland Kitchen atorvastatin (LIPITOR) 20 MG tablet TAKE 1 TABLET BY MOUTH ONCE DAILY 90 tablet 3  . Blood Pressure Monitoring (OMRON 3 SERIES BP MONITOR) DEVI USE AS DIRECTED 1 each 0  . Continuous Blood Gluc Sensor (DEXCOM G6 SENSOR) MISC 1 Device by  Does not apply route as directed. 9 each 3  . Continuous Blood Gluc Sensor (DEXCOM G6 SENSOR) MISC Use as directed every 10 days 3 each 11  . Continuous Blood Gluc Transmit (DEXCOM G6 TRANSMITTER) MISC 1 Device by Does not apply route as directed. 1 each 3  . Continuous Blood Gluc Transmit (DEXCOM G6 TRANSMITTER) MISC Use as directed 1 each 3  . fluticasone (FLONASE) 50 MCG/ACT nasal spray Place 2 sprays into both nostrils daily. 16 g 5  . glipiZIDE  (GLUCOTROL) 10 MG tablet TAKE 1 TABLET BY MOUTH BEFORE BREAKFAST AND 2 TABLETS BEFORE SUPPER 270 tablet 3  . glucose blood test strip USE TO TEST BLOOD SUGAR TWO TIMES DAILY (Patient taking differently: USE TO TEST BLOOD SUGAR TWO TIMES DAILY) 100 strip 3  . lisinopril (ZESTRIL) 30 MG tablet TAKE 1 TABLET BY MOUTH ONCE DAILY 90 tablet 3  . loratadine (CLARITIN) 10 MG tablet Take 10 mg by mouth daily.    . metFORMIN (GLUCOPHAGE) 1000 MG tablet TAKE 1 TABLET BY MOUTH ONCE DAILY WITH BREAKFAST 90 tablet 3  . Multiple Vitamin (MULTIVITAMIN WITH MINERALS) TABS tablet Take 1 tablet by mouth daily.    . potassium chloride SA (KLOR-CON) 20 MEQ tablet TAKE 1 TABLET BY MOUTH ONCE A DAY 30 tablet 0  . Semaglutide, 1 MG/DOSE, (OZEMPIC, 1 MG/DOSE,) 4 MG/3ML SOPN INJECT 1MG  UNDER THE SKIN ONCE A WEEK 3 mL 0  . Semaglutide, 1 MG/DOSE, 4 MG/3ML SOPN INJECT 1 MG INTO THE SKIN ONCE A WEEK. 15 mL 0  . triamcinolone ointment (KENALOG) 0.1 %     . triamterene-hydrochlorothiazide (MAXZIDE-25) 37.5-25 MG tablet TAKE 1 TABLET BY MOUTH ONCE DAILY 30 tablet 3   No current facility-administered medications on file prior to visit.    ALLERGIES: Allergies  Allergen Reactions  . Trulicity [Dulaglutide] Itching    Rash/itching at injection site    FAMILY HISTORY: Family History  Problem Relation Age of Onset  . Diabetes Mother   . Congestive Heart Failure Mother     Objective:  Blood pressure 126/83, pulse 87, height 5\' 3"  (1.6 m), weight 206 lb 12.8 oz (93.8 kg), SpO2 96 %. General: No acute distress.  Patient appears well-groomed.   Head:  Normocephalic/atraumatic Eyes:  fundi examined but not visualized Neck: supple, no paraspinal tenderness, full range of motion Back: No paraspinal tenderness Heart: regular rate and rhythm Lungs: Clear to auscultation bilaterally. Vascular: No carotid bruits. Neurological Exam: Mental status: alert and oriented to person, place, and time, recent and remote memory  intact, fund of knowledge intact, attention and concentration intact, speech fluent and not dysarthric, language intact. Cranial nerves: CN I: not tested CN II: pupils equal, round and reactive to light, visual fields intact CN III, IV, VI:  full range of motion, no nystagmus, no ptosis CN V: facial sensation intact. CN VII: upper and lower face symmetric CN VIII: hearing intact CN IX, X: gag intact, uvula midline CN XI: sternocleidomastoid and trapezius muscles intact CN XII: tongue midline Bulk & Tone: normal, no fasciculations. Motor:  muscle strength 5/5 throughout Sensation:  Pinprick, temperature and vibratory sensation intact. Deep Tendon Reflexes:  2+ throughout,  toes downgoing.   Finger to nose testing:  Without dysmetria.   Heel to shin:  Without dysmetria.   Gait:  Normal station and stride.  Romberg negative.    Thank you for allowing me to take part in the care of this patient.  Metta Clines, DO  CC: Grier Mitts,  MD     

## 2020-08-27 ENCOUNTER — Other Ambulatory Visit: Payer: Self-pay

## 2020-08-27 ENCOUNTER — Ambulatory Visit: Payer: 59 | Admitting: Neurology

## 2020-08-27 ENCOUNTER — Encounter: Payer: Self-pay | Admitting: Neurology

## 2020-08-27 VITALS — BP 126/83 | HR 87 | Ht 63.0 in | Wt 206.8 lb

## 2020-08-27 DIAGNOSIS — E119 Type 2 diabetes mellitus without complications: Secondary | ICD-10-CM

## 2020-08-27 DIAGNOSIS — R29810 Facial weakness: Secondary | ICD-10-CM | POA: Diagnosis not present

## 2020-08-27 DIAGNOSIS — E1169 Type 2 diabetes mellitus with other specified complication: Secondary | ICD-10-CM

## 2020-08-27 DIAGNOSIS — I10 Essential (primary) hypertension: Secondary | ICD-10-CM | POA: Diagnosis not present

## 2020-08-27 DIAGNOSIS — E785 Hyperlipidemia, unspecified: Secondary | ICD-10-CM | POA: Diagnosis not present

## 2020-08-27 NOTE — Patient Instructions (Signed)
I don't have a definite diagnosis. I would er on side of caution and recommend aspirin 81mg .  If you cannot take daily, then at least 2 times a week.  We will check carotid ultrasound to evaluate for any plaque buildup in the carotid artery Continue blood pressure, cholesterol and diabetes medications Further recommendations pending results.

## 2020-08-28 ENCOUNTER — Telehealth: Payer: Self-pay | Admitting: Neurology

## 2020-08-28 NOTE — Telephone Encounter (Signed)
Please advise 

## 2020-08-28 NOTE — Telephone Encounter (Signed)
The order is for bilateral carotid doppler/ultrasound.  It shouldn't require seeing cardiology.

## 2020-08-28 NOTE — Telephone Encounter (Signed)
Left message of the above.

## 2020-09-05 ENCOUNTER — Other Ambulatory Visit: Payer: Self-pay

## 2020-09-05 ENCOUNTER — Ambulatory Visit (HOSPITAL_COMMUNITY)
Admission: RE | Admit: 2020-09-05 | Discharge: 2020-09-05 | Disposition: A | Discharge status: Home / Self Care | Payer: 59 | Source: Ambulatory Visit | Attending: Cardiovascular Disease | Admitting: Cardiovascular Disease

## 2020-09-05 DIAGNOSIS — R29810 Facial weakness: Secondary | ICD-10-CM | POA: Insufficient documentation

## 2020-09-09 ENCOUNTER — Telehealth: Payer: Self-pay

## 2020-09-09 ENCOUNTER — Other Ambulatory Visit (HOSPITAL_COMMUNITY): Payer: Self-pay

## 2020-09-09 MED FILL — Triamterene & Hydrochlorothiazide Tab 37.5-25 MG: ORAL | 30 days supply | Qty: 30 | Fill #1 | Status: AC

## 2020-09-09 NOTE — Telephone Encounter (Signed)
Patient has been notified via Mychart of her Carotid Vascular Ultrasound per her request.

## 2020-09-09 NOTE — Telephone Encounter (Signed)
Called patient and left a message for a call back.  

## 2020-09-09 NOTE — Telephone Encounter (Signed)
-----   Message from Pieter Partridge, DO sent at 09/07/2020  1:43 PM EDT ----- Carotid arteries look okay.  No further recommendations other than what was discussed at her visit (aspirin, cholesterol medication, blood pressure control, blood sugar control)

## 2020-09-11 DIAGNOSIS — K648 Other hemorrhoids: Secondary | ICD-10-CM | POA: Diagnosis not present

## 2020-09-11 DIAGNOSIS — K5900 Constipation, unspecified: Secondary | ICD-10-CM | POA: Diagnosis not present

## 2020-09-23 ENCOUNTER — Encounter: Payer: Self-pay | Admitting: Family Medicine

## 2020-09-23 ENCOUNTER — Other Ambulatory Visit (HOSPITAL_COMMUNITY): Payer: Self-pay

## 2020-09-23 ENCOUNTER — Ambulatory Visit: Payer: 59 | Admitting: Family Medicine

## 2020-09-23 ENCOUNTER — Other Ambulatory Visit: Payer: Self-pay

## 2020-09-23 VITALS — BP 130/90 | HR 90 | Temp 98.9°F | Wt 210.2 lb

## 2020-09-23 DIAGNOSIS — M7061 Trochanteric bursitis, right hip: Secondary | ICD-10-CM | POA: Diagnosis not present

## 2020-09-23 MED ORDER — DICLOFENAC SODIUM 75 MG PO TBEC
75.0000 mg | DELAYED_RELEASE_TABLET | Freq: Two times a day (BID) | ORAL | 0 refills | Status: DC
  Filled 2020-09-23: qty 60, 30d supply, fill #0

## 2020-09-23 NOTE — Progress Notes (Signed)
   Subjective:    Patient ID: Laura Marsh, female    DOB: Apr 26, 1955, 65 y.o.   MRN: [id number]  HPI Here for a pain in the right hip that started 6 days ago. No recent trauma. She has never had this before. The pain is in a specific spot in the lateral hip area and it is sharp in nature. No back pain. No numbness or weakness in the leg. She is taking Tylenol with no relief. The pain is worst when she first starts to walk after she has been sitting, then it eases up as she keeps walking. No pain with sitting or lying down.    Review of Systems  Constitutional: Negative.   Respiratory: Negative.    Cardiovascular: Negative.   Musculoskeletal:  Positive for myalgias.      Objective:   Physical Exam Constitutional:      Comments: She walks with a slight limp   Cardiovascular:     Rate and Rhythm: Normal rate and regular rhythm.     Pulses: Normal pulses.     Heart sounds: Normal heart sounds.  Pulmonary:     Effort: Pulmonary effort is normal.     Breath sounds: Normal breath sounds.  Musculoskeletal:     Comments: She is not tender over the spine. The spine and hips have full ROM. She is quite tender over the right greater trochanter   Neurological:     Mental Status: She is alert.          Assessment & Plan:  Trochanteric bursitis. She will apply ice several times a day and take Diclofenac BID. Avoid any unnecessary walking. Recheck as needed. Alysia Penna, MD

## 2020-09-25 ENCOUNTER — Telehealth: Payer: Self-pay

## 2020-09-25 DIAGNOSIS — M7061 Trochanteric bursitis, right hip: Secondary | ICD-10-CM

## 2020-09-26 ENCOUNTER — Ambulatory Visit: Payer: 59 | Admitting: Family Medicine

## 2020-09-26 NOTE — Telephone Encounter (Signed)
Spoke with pt regarding this message

## 2020-09-26 NOTE — Telephone Encounter (Signed)
I did a referral to Orthopedics so she can get a cortisone shot (we discussed this)

## 2020-09-26 NOTE — Telephone Encounter (Signed)
Spoke with pt verbalized understanding of Dr Fry recommendation 

## 2020-09-26 NOTE — Telephone Encounter (Signed)
LVM for callback to discuss message 

## 2020-09-26 NOTE — Telephone Encounter (Signed)
The patient was wanting to know if the message can be discussed through [place] since she is at work.

## 2020-10-04 ENCOUNTER — Other Ambulatory Visit (HOSPITAL_COMMUNITY): Payer: Self-pay

## 2020-10-04 ENCOUNTER — Other Ambulatory Visit: Payer: Self-pay | Admitting: Internal Medicine

## 2020-10-04 MED ORDER — OZEMPIC (1 MG/DOSE) 4 MG/3ML ~~LOC~~ SOPN
PEN_INJECTOR | SUBCUTANEOUS | 0 refills | Status: DC
  Filled 2020-10-04: qty 3, 28d supply, fill #0

## 2020-10-07 ENCOUNTER — Encounter: Payer: Self-pay | Admitting: Family Medicine

## 2020-10-07 ENCOUNTER — Other Ambulatory Visit (HOSPITAL_COMMUNITY): Payer: Self-pay

## 2020-10-07 ENCOUNTER — Ambulatory Visit (INDEPENDENT_AMBULATORY_CARE_PROVIDER_SITE_OTHER): Payer: 59 | Admitting: Family Medicine

## 2020-10-07 ENCOUNTER — Other Ambulatory Visit: Payer: Self-pay

## 2020-10-07 DIAGNOSIS — M25551 Pain in right hip: Secondary | ICD-10-CM | POA: Diagnosis not present

## 2020-10-07 NOTE — Progress Notes (Signed)
Office Visit Note   Patient: Laura Marsh           Date of Birth: [birth year]           MRN: [id number] Visit Date: 10/07/2020 Requested by: Laurey Morale, MD [place],  [place] [address] PCP: Billie Ruddy, MD  Subjective: Chief Complaint  Patient presents with   Right Hip - Pain    Pain in posterior hip/buttock and down the lateral hip, x 3 weeks. Has improved some with Tylenol and diclofenac regimen from Dr. Sarajane Jews. Mainly hurts when she first stands up - eases up after walking a bit.     HPI: She is here with right hip pain.  Symptoms started about 3 weeks ago, no definite injury.  She began noticing it 1 day after work.  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  She notices pain when transitioning from sitting to standing, and she walks with a limp for the first few steps.  Pain is posterior lateral with no radicular symptoms.  Denies any groin pain.  Pain is getting better, she is taking diclofenac.              ROS:   All other systems were reviewed and are negative.  Objective: Vital Signs: There were no vitals taken for this visit.  Physical Exam:  General:  Alert and oriented, in no acute distress. Pulm:  Breathing unlabored. Psy:  Normal mood, congruent affect  Right hip: She is tender near the right SI joint, and the right gluteus medius area and in the right greater trochanter.  No pain with internal/external hip rotation, negative straight leg raise.  Lower extremity strength and reflexes are normal.  Imaging: No results found.  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  As long as her pain subsides she will follow-up as needed.  If it flares up again, she will contact me for a physical therapy referral.  If that does not help, then possibly injection.     Procedures: No procedures  performed        PMFS History: Patient Active Problem List   Diagnosis Date Noted   Hyperlipidemia associated with type 2 diabetes mellitus ([place]) 08/25/2017   Morbid obesity ([place]) 08/25/2017   Ganglion cyst of wrist 12/25/2014   De Quervain's disease (tenosynovitis) 06/25/2014   Hypertension associated with diabetes ([place]) 04/13/2012   Diabetes ([place]) 04/13/2012   Past Medical History:  Diagnosis Date   Colon polyp    Diabetes mellitus    Elevated cholesterol    Hypertension     Family History  Problem Relation Age of Onset   Diabetes Mother    Congestive Heart Failure Mother     Past Surgical History:  Procedure Laterality Date   ABDOMINAL HYSTERECTOMY     COLONOSCOPY     COLONOSCOPY WITH PROPOFOL N/A 11/08/2017   Procedure: COLONOSCOPY WITH PROPOFOL;  Surgeon: Toledo, Benay Pike, MD;  Location: [facility] ENDOSCOPY;  Service: Gastroenterology;  Laterality: N/A;   COLONOSCOPY WITH PROPOFOL N/A 04/28/2020   Procedure: COLONOSCOPY WITH PROPOFOL;  Surgeon: Benjamine Sprague, DO;  Location: [facility] ENDOSCOPY;  Service: General;  Laterality: N/A;   Social History   Occupational History   Not on file  Tobacco Use   Smoking status: Never   Smokeless tobacco: Never  Vaping Use  Vaping Use: Never used  Substance and Sexual Activity   Alcohol use: Yes    Comment: rarely   Drug use: No   Sexual activity: Not on file

## 2020-10-09 ENCOUNTER — Other Ambulatory Visit (HOSPITAL_COMMUNITY): Payer: Self-pay

## 2020-10-09 ENCOUNTER — Other Ambulatory Visit: Payer: Self-pay | Admitting: Family Medicine

## 2020-10-09 DIAGNOSIS — I10 Essential (primary) hypertension: Secondary | ICD-10-CM

## 2020-10-09 MED ORDER — TRIAMTERENE-HCTZ 37.5-25 MG PO TABS
1.0000 | ORAL_TABLET | Freq: Every day | ORAL | 1 refills | Status: DC
Start: 2020-10-09 — End: 2021-06-18
  Filled 2020-10-09: qty 90, 90d supply, fill #0
  Filled 2021-01-29: qty 90, 90d supply, fill #1

## 2020-10-09 MED FILL — Lisinopril Tab 30 MG: ORAL | 90 days supply | Qty: 90 | Fill #1 | Status: AC

## 2020-10-10 ENCOUNTER — Other Ambulatory Visit (HOSPITAL_COMMUNITY): Payer: Self-pay

## 2020-10-11 ENCOUNTER — Other Ambulatory Visit (HOSPITAL_COMMUNITY): Payer: Self-pay

## 2020-10-13 ENCOUNTER — Other Ambulatory Visit (HOSPITAL_COMMUNITY): Payer: Self-pay

## 2020-10-13 ENCOUNTER — Other Ambulatory Visit: Payer: Self-pay | Admitting: Internal Medicine

## 2020-10-13 MED ORDER — METFORMIN HCL 1000 MG PO TABS
ORAL_TABLET | Freq: Every day | ORAL | 3 refills | Status: DC
  Filled 2020-10-13: qty 90, 90d supply, fill #0
  Filled 2020-10-13: qty 90, fill #0
  Filled 2021-01-01: qty 90, 90d supply, fill #1
  Filled 2021-03-25: qty 90, 90d supply, fill #2

## 2020-10-24 ENCOUNTER — Encounter: Payer: Self-pay | Admitting: Internal Medicine

## 2020-10-24 ENCOUNTER — Other Ambulatory Visit (HOSPITAL_COMMUNITY): Payer: Self-pay

## 2020-10-24 ENCOUNTER — Other Ambulatory Visit: Payer: Self-pay

## 2020-10-24 ENCOUNTER — Ambulatory Visit: Payer: 59 | Admitting: Internal Medicine

## 2020-10-24 VITALS — BP 114/78 | HR 110 | Ht 63.0 in | Wt 204.6 lb

## 2020-10-24 DIAGNOSIS — E1165 Type 2 diabetes mellitus with hyperglycemia: Secondary | ICD-10-CM

## 2020-10-24 LAB — POCT GLYCOSYLATED HEMOGLOBIN (HGB A1C): Hemoglobin A1C: 9.1 % — AB (ref 4.0–5.6)

## 2020-10-24 LAB — POCT GLUCOSE (DEVICE FOR HOME USE): POC Glucose: 198 mg/dl — AB (ref 70–99)

## 2020-10-24 MED ORDER — GLIPIZIDE 10 MG PO TABS
10.0000 mg | ORAL_TABLET | Freq: Two times a day (BID) | ORAL | 3 refills | Status: DC
  Filled 2020-10-24: qty 180, 90d supply, fill #0
  Filled 2021-01-29: qty 180, 90d supply, fill #1
  Filled 2021-05-01: qty 180, 90d supply, fill #2
  Filled 2021-08-03: qty 180, 90d supply, fill #3

## 2020-10-24 MED ORDER — OZEMPIC (1 MG/DOSE) 2 MG/1.5ML ~~LOC~~ SOPN
2.0000 mg | PEN_INJECTOR | SUBCUTANEOUS | 3 refills | Status: DC
  Filled 2020-10-24: qty 4.5, 21d supply, fill #0

## 2020-10-24 MED ORDER — DAPAGLIFLOZIN PROPANEDIOL 5 MG PO TABS
5.0000 mg | ORAL_TABLET | Freq: Every day | ORAL | 6 refills | Status: DC
  Filled 2020-10-24: qty 30, 30d supply, fill #0
  Filled 2020-11-20: qty 30, 30d supply, fill #1
  Filled 2021-01-13: qty 30, 30d supply, fill #2
  Filled 2021-02-10: qty 30, 30d supply, fill #3
  Filled 2021-03-12: qty 30, 30d supply, fill #4

## 2020-10-24 MED ORDER — OZEMPIC (2 MG/DOSE) 8 MG/3ML ~~LOC~~ SOPN
2.0000 mg | PEN_INJECTOR | SUBCUTANEOUS | 3 refills | Status: DC
  Filled 2020-10-24: qty 3, 30d supply, fill #0
  Filled 2020-10-24: qty 9, 84d supply, fill #0
  Filled 2021-01-26: qty 3, 28d supply, fill #1
  Filled 2021-02-20: qty 3, 28d supply, fill #2
  Filled 2021-03-20: qty 3, 28d supply, fill #3
  Filled 2021-05-01: qty 3, 28d supply, fill #4
  Filled 2021-06-05: qty 3, 28d supply, fill #5

## 2020-10-24 NOTE — Progress Notes (Signed)
Name: Laura Marsh  Age/ Sex: 65 y.o., female   MRN/ DOB: [id number], 02-20-1956     PCP: Billie Ruddy, MD   Reason for Endocrinology Evaluation: Type 2 Diabetes Mellitus     Initial Endocrinology Clinic Visit: 06/16/2018    PATIENT IDENTIFIER: Laura Marsh is a 65 y.o. female with a past medical history of HTN, T2DM and Dyslipidemia. The patient has followed with Endocrinology clinic since 06/16/2018 for consultative assistance with management of her diabetes.  DIABETIC HISTORY:  Ms. Boudreau was diagnosed with T2DM ~ 2014. She has been on oral glycemic agents for years. She has never been on insulin therapy. Her hemoglobin A1c has ranged from 6.5% in 2014, peaking at 11.0% in 2019.   On her initial visit to our clinic she was on Metformin, Glipizide and Januvia, with an A1c 9.7 %.  We stopped Januvia and glipizide and started Ozempic with continuation of Metformin    [facility]  I have attempted to screen her for Cushing's syndrome due to spontaneous hypokalemia and weight gain in 06/2020 but she did not return the urine sample.   SUBJECTIVE:   During the last visit (06/20/2020): A1c 8.9 % . We increased glipizide and continued Metformin,  and increased Ozempic      Today (10/24/2020): Ms. Welford is here for a follow up on diabetes follow up.  She checks her blood sugars multiple times a day through the dexcom . The patient has not had hypoglycemic episodes since the last clinic visit.      She has been taking Metformin twice a day  Glipizide causes constipation so she reduced it to 1 tablet twice a day  She afraid on Jardiance  Has not been exercising for the past month due to hip bursitis     HOME DIABETES REGIMEN:  Metformin 1000 mg Once daily Glipizide 10 mg , 1 tablet before breakfast and 2 tablets before supper- takes it twice a day  Ozempic 1 mg weekly    CONTINUOUS GLUCOSE MONITORING RECORD  INTERPRETATION   Unable to tolerate      DIABETIC COMPLICATIONS: Microvascular complications:   Denies: CKD, retinopathy, neuropathy  Last eye exam: Completed 2021   Macrovascular complications: Denies: CAD, PVD, CVA    HISTORY:  Past Medical History:  Past Medical History:  Diagnosis Date   Colon polyp    Diabetes mellitus    Elevated cholesterol    Hypertension    Past Surgical History:  Past Surgical History:  Procedure Laterality Date   ABDOMINAL HYSTERECTOMY     COLONOSCOPY     COLONOSCOPY WITH PROPOFOL N/A 11/08/2017   Procedure: COLONOSCOPY WITH PROPOFOL;  Surgeon: Toledo, Benay Pike, MD;  Location: [facility] ENDOSCOPY;  Service: Gastroenterology;  Laterality: N/A;   COLONOSCOPY WITH PROPOFOL N/A 04/28/2020   Procedure: COLONOSCOPY WITH PROPOFOL;  Surgeon: Benjamine Sprague, DO;  Location: [facility] ENDOSCOPY;  Service: General;  Laterality: N/A;   Social History:  reports that she has never smoked. She has never used smokeless tobacco. She reports current alcohol use. She reports that she does not use drugs. Family History:  Family History  Problem Relation Age of Onset   Diabetes Mother    Congestive Heart Failure Mother      HOME MEDICATIONS: Allergies as of 10/24/2020       Reactions   Trulicity [dulaglutide] Itching   Rash/itching at injection site  Medication List        Accurate as of October 24, 2020  2:49 PM. If you have any questions, ask your nurse or doctor.          STOP taking these medications    Ozempic (1 MG/DOSE) 4 MG/3ML Sopn Generic drug: Semaglutide (1 MG/DOSE) Replaced by: Ozempic (2 MG/DOSE) 8 MG/3ML Sopn Stopped by: Dorita Sciara, MD       TAKE these medications    acetaminophen 500 MG tablet Commonly known as: TYLENOL Take 500 mg by mouth 2 (two) times daily.   Alcohol Swabs Pads Use as directed.   amLODipine 5 MG tablet Commonly known as: NORVASC TAKE 1 TABLET BY MOUTH DAILY.   aspirin 81 MG EC tablet Take  81 mg by mouth daily. Swallow whole. Taken it twice a week   atorvastatin 20 MG tablet Commonly known as: LIPITOR TAKE 1 TABLET BY MOUTH ONCE DAILY   dapagliflozin propanediol 5 MG Tabs tablet Commonly known as: Farxiga Take 1 tablet (5 mg total) by mouth daily before breakfast. Started by: Dorita Sciara, MD   Dexcom G6 Sensor Misc 1 Device by Does not apply route as directed.   Dexcom G6 Sensor Misc Use as directed every 10 days (Use as directed every 10 days)   Dexcom G6 Transmitter Misc 1 Device by Does not apply route as directed.   Dexcom G6 Transmitter Misc Use as directed (Use as directed)   diclofenac 75 MG EC tablet Commonly known as: VOLTAREN Take 1 tablet (75 mg total) by mouth 2 (two) times daily.   fluticasone 50 MCG/ACT nasal spray Commonly known as: FLONASE Place 2 sprays into both nostrils daily.   FREESTYLE LITE test strip Generic drug: glucose blood USE TO TEST BLOOD SUGAR TWO TIMES DAILY   glipiZIDE 10 MG tablet Commonly known as: GLUCOTROL Take 1 tablet (10 mg total) by mouth 2 (two) times daily before a meal. What changed:  how much to take how to take this when to take this Changed by: Dorita Sciara, MD   lisinopril 30 MG tablet Commonly known as: ZESTRIL TAKE 1 TABLET BY MOUTH ONCE DAILY   loratadine 10 MG tablet Commonly known as: CLARITIN Take 10 mg by mouth daily as needed.   METAMUCIL FIBER PO Take by mouth in the morning and at bedtime.   metFORMIN 1000 MG tablet Commonly known as: GLUCOPHAGE Take 1 tablet by mouth daily with breakfast.   multivitamin with minerals Tabs tablet Take 1 tablet by mouth daily.   Omron 3 Series BP Monitor Devi USE AS DIRECTED   Ozempic (2 MG/DOSE) 8 MG/3ML Sopn Generic drug: Semaglutide (2 MG/DOSE) Inject 2 mg into the skin once a week. Replaces: Ozempic (1 MG/DOSE) 4 MG/3ML Sopn Started by: Dorita Sciara, MD   potassium chloride SA 20 MEQ tablet Commonly known as:  KLOR-CON TAKE 1 TABLET BY MOUTH ONCE A DAY   triamterene-hydrochlorothiazide 37.5-25 MG tablet Commonly known as: MAXZIDE-25 Take 1 tablet by mouth daily.      PHYSICAL EXAM: VS: BP 114/78   Pulse (!) 110   Ht '5\' 3"'$  (1.6 m)   Wt 204 lb 9.6 oz (92.8 kg)   SpO2 98%   BMI 36.24 kg/m    EXAM: General: Pt appears well and is in NAD  Lungs: Clear with good BS bilat with no rales, rhonchi, or wheezes  Heart: Auscultation: RRR  Periph. circulation: non pitting edema  Mental Status: Judgment, insight: intact Orientation: oriented  to time, place, and person Mood and affect: no depression, anxiety, or agitation    DM Foot exam 06/20/2020  The skin of the feet is intact without sores or ulcerations. The pedal pulses are 2+ on right and 2+ on left. The sensation is intact to a screening 5.07, 10 gram monofilament bilaterally    DATA REVIEWED:  Lab Results  Component Value Date   HGBA1C 9.1 (A) 10/24/2020   HGBA1C 8.9 (A) 06/20/2020   HGBA1C 9.0 (A) 11/06/2019   Lab Results  Component Value Date   MICROALBUR <0.7 05/09/2019   LDLCALC 84 12/07/2019   CREATININE 0.60 06/16/2020   Lab Results  Component Value Date   MICRALBCREAT 1.0 05/09/2019     Lab Results  Component Value Date   CHOL 156 12/07/2019   HDL 57 12/07/2019   LDLCALC 84 12/07/2019   LDLDIRECT 140.3 02/19/2013   TRIG 71 12/07/2019   CHOLHDL 2.7 12/07/2019         ASSESSMENT / PLAN / RECOMMENDATIONS:   1) Type 2 Diabetes Mellitus, Poorly controlled, Without complications - Most recent A1c of 9.1 %. Goal A1c < 7.0 %.     - She continues with worsening  hyperglycemia  -She is unable to increase glipizide due to constipation - She is intolerant to higher doses of metformin - We  discussed SGLT-2 inhibitors again, discussed risk of genital infections as well as cardiovascular and weight benefits, - IN the meantime will increase Ozempic - She has declined RD referral in the past    MEDICATIONS: Continue Metformin 1000 mg Once daily Continue glipizide 10 mg, 1 tablet before Breakfast and 1 tablet before Supper Increase Ozempic to 2 mg daily  Start Farxiga 5 mg daily      EDUCATION / INSTRUCTIONS: BG monitoring instructions: Patient is instructed to check her blood sugars 2 times a day, fasting and bedtime. Call [facility] if: BG persistently < 70  I reviewed the Rule of 15 for the treatment of hypoglycemia in detail with the patient. Literature supplied.  2) Diabetic complications:  Eye: Does not have known diabetic retinopathy.  Neuro/ Feet: Does not have known diabetic peripheral neuropathy. Renal: Patient does not have known baseline CKD. She is  on an ACEI/ARB at present.  Micro albumin urea testing is normal and up-to-date      3) Hypokalemia:  -I have attempted to screen her for Cushing's in the past but she has not done the 24-hour urine    F/U in  3 months    Signed electronically by: Mack Guise, MD  [facility]  [facility] [address] [place], [place] [address] Phone: [phone] FAX: [phone]   CC: Billie Ruddy, [place] [address] Phone: [phone]  Fax: [phone]  Return to Endocrinology clinic as below: Future Appointments  Date Time Provider [place]  12/12/2020  9:00 AM Billie Ruddy, MD [id number]

## 2020-10-24 NOTE — Patient Instructions (Addendum)
-   Change Glipizide 10 mg, 1 tablet before Breakfast and 1 tablet before Supper - Continue Metformin 1000 mg daily  - Increase Ozempic to 2 mg weekly   - Start Farxiga 5 mg, 1 tablet every morning    HOW TO TREAT LOW BLOOD SUGARS (Blood sugar LESS THAN 70 MG/DL) Please follow the RULE OF 15 for the treatment of hypoglycemia treatment (when your (blood sugars are less than 70 mg/dL)   STEP 1: Take 15 grams of carbohydrates when your blood sugar is low, which includes:  3-4 GLUCOSE TABS  OR 3-4 OZ OF JUICE OR REGULAR SODA OR ONE TUBE OF GLUCOSE GEL    STEP 2: RECHECK blood sugar in 15 MINUTES STEP 3: If your blood sugar is still low at the 15 minute recheck --> then, go back to STEP 1 and treat AGAIN with another 15 grams of carbohydrates.

## 2020-10-27 ENCOUNTER — Other Ambulatory Visit (HOSPITAL_COMMUNITY): Payer: Self-pay

## 2020-11-07 DIAGNOSIS — H524 Presbyopia: Secondary | ICD-10-CM | POA: Diagnosis not present

## 2020-11-07 DIAGNOSIS — H2513 Age-related nuclear cataract, bilateral: Secondary | ICD-10-CM | POA: Diagnosis not present

## 2020-11-07 DIAGNOSIS — E119 Type 2 diabetes mellitus without complications: Secondary | ICD-10-CM | POA: Diagnosis not present

## 2020-11-07 DIAGNOSIS — H04123 Dry eye syndrome of bilateral lacrimal glands: Secondary | ICD-10-CM | POA: Diagnosis not present

## 2020-11-07 DIAGNOSIS — H52223 Regular astigmatism, bilateral: Secondary | ICD-10-CM | POA: Diagnosis not present

## 2020-11-07 DIAGNOSIS — D3132 Benign neoplasm of left choroid: Secondary | ICD-10-CM | POA: Diagnosis not present

## 2020-11-14 DIAGNOSIS — Z6837 Body mass index (BMI) 37.0-37.9, adult: Secondary | ICD-10-CM | POA: Diagnosis not present

## 2020-11-14 DIAGNOSIS — Z1231 Encounter for screening mammogram for malignant neoplasm of breast: Secondary | ICD-10-CM | POA: Diagnosis not present

## 2020-11-14 DIAGNOSIS — Z01419 Encounter for gynecological examination (general) (routine) without abnormal findings: Secondary | ICD-10-CM | POA: Diagnosis not present

## 2020-11-20 ENCOUNTER — Other Ambulatory Visit (HOSPITAL_COMMUNITY): Payer: Self-pay

## 2020-12-07 MED FILL — Atorvastatin Calcium Tab 20 MG (Base Equivalent): ORAL | 90 days supply | Qty: 90 | Fill #1 | Status: AC

## 2020-12-09 ENCOUNTER — Other Ambulatory Visit (HOSPITAL_COMMUNITY): Payer: Self-pay

## 2020-12-12 ENCOUNTER — Ambulatory Visit (INDEPENDENT_AMBULATORY_CARE_PROVIDER_SITE_OTHER): Payer: 59 | Admitting: Family Medicine

## 2020-12-12 ENCOUNTER — Other Ambulatory Visit: Payer: Self-pay

## 2020-12-12 ENCOUNTER — Encounter: Payer: Self-pay | Admitting: Family Medicine

## 2020-12-12 VITALS — BP 118/70 | HR 99 | Temp 98.0°F | Ht 63.0 in | Wt 204.8 lb

## 2020-12-12 DIAGNOSIS — Z0000 Encounter for general adult medical examination without abnormal findings: Secondary | ICD-10-CM | POA: Diagnosis not present

## 2020-12-12 DIAGNOSIS — R635 Abnormal weight gain: Secondary | ICD-10-CM | POA: Diagnosis not present

## 2020-12-12 DIAGNOSIS — I10 Essential (primary) hypertension: Secondary | ICD-10-CM | POA: Diagnosis not present

## 2020-12-12 DIAGNOSIS — E559 Vitamin D deficiency, unspecified: Secondary | ICD-10-CM

## 2020-12-12 DIAGNOSIS — E785 Hyperlipidemia, unspecified: Secondary | ICD-10-CM

## 2020-12-12 DIAGNOSIS — M7662 Achilles tendinitis, left leg: Secondary | ICD-10-CM | POA: Diagnosis not present

## 2020-12-12 DIAGNOSIS — E1169 Type 2 diabetes mellitus with other specified complication: Secondary | ICD-10-CM

## 2020-12-12 DIAGNOSIS — E876 Hypokalemia: Secondary | ICD-10-CM | POA: Diagnosis not present

## 2020-12-12 LAB — CBC WITH DIFFERENTIAL/PLATELET
Basophils Absolute: 0.1 10*3/uL (ref 0.0–0.1)
Basophils Relative: 0.7 % (ref 0.0–3.0)
Eosinophils Absolute: 0.2 10*3/uL (ref 0.0–0.7)
Eosinophils Relative: 3.2 % (ref 0.0–5.0)
HCT: 39.1 % (ref 36.0–46.0)
Hemoglobin: 12.2 g/dL (ref 12.0–15.0)
Lymphocytes Relative: 32.1 % (ref 12.0–46.0)
Lymphs Abs: 2.4 10*3/uL (ref 0.7–4.0)
MCHC: 31.1 g/dL (ref 30.0–36.0)
MCV: 84.3 fl (ref 78.0–100.0)
Monocytes Absolute: 0.4 10*3/uL (ref 0.1–1.0)
Monocytes Relative: 5.7 % (ref 3.0–12.0)
Neutro Abs: 4.4 10*3/uL (ref 1.4–7.7)
Neutrophils Relative %: 58.3 % (ref 43.0–77.0)
Platelets: 331 10*3/uL (ref 150.0–400.0)
RBC: 4.64 Mil/uL (ref 3.87–5.11)
RDW: 13.6 % (ref 11.5–15.5)
WBC: 7.5 10*3/uL (ref 4.0–10.5)

## 2020-12-12 LAB — COMPREHENSIVE METABOLIC PANEL
ALT: 22 U/L (ref 0–35)
AST: 15 U/L (ref 0–37)
Albumin: 4.2 g/dL (ref 3.5–5.2)
Alkaline Phosphatase: 97 U/L (ref 39–117)
BUN: 14 mg/dL (ref 6–23)
CO2: 30 mEq/L (ref 19–32)
Calcium: 10 mg/dL (ref 8.4–10.5)
Chloride: 101 mEq/L (ref 96–112)
Creatinine, Ser: 0.82 mg/dL (ref 0.40–1.20)
GFR: 76.36 mL/min (ref >60.00)
Glucose, Bld: 163 mg/dL — ABNORMAL HIGH (ref 70–99)
Potassium: 3.3 mEq/L — ABNORMAL LOW (ref 3.5–5.1)
Sodium: 139 mEq/L (ref 135–145)
Total Bilirubin: 0.5 mg/dL (ref 0.2–1.2)
Total Protein: 6.8 g/dL (ref 6.0–8.3)

## 2020-12-12 LAB — T4, FREE: Free T4: 1.01 ng/dL (ref 0.60–1.60)

## 2020-12-12 LAB — TSH: TSH: 1.32 u[IU]/mL (ref 0.35–5.50)

## 2020-12-12 LAB — LIPID PANEL
Cholesterol: 172 mg/dL (ref 0–200)
HDL: 50.9 mg/dL (ref >39.00)
LDL Cholesterol: 101 mg/dL — ABNORMAL HIGH (ref 0–99)
NonHDL: 121.21
Total CHOL/HDL Ratio: 3
Triglycerides: 100 mg/dL (ref 0.0–149.0)
VLDL: 20 mg/dL (ref 0.0–40.0)

## 2020-12-12 LAB — VITAMIN D 25 HYDROXY (VIT D DEFICIENCY, FRACTURES): VITD: 28.3 ng/mL — ABNORMAL LOW (ref 30.00–100.00)

## 2020-12-12 LAB — VITAMIN B12: Vitamin B-12: 574 pg/mL (ref 211–911)

## 2020-12-12 MED ORDER — VITAMIN D (ERGOCALCIFEROL) 1.25 MG (50000 UNIT) PO CAPS
50000.0000 [IU] | ORAL_CAPSULE | ORAL | 0 refills | Status: DC
  Filled 2020-12-12: qty 12, 84d supply, fill #0

## 2020-12-12 MED ORDER — POTASSIUM CHLORIDE CRYS ER 20 MEQ PO TBCR
20.0000 meq | EXTENDED_RELEASE_TABLET | Freq: Every day | ORAL | 1 refills | Status: DC
  Filled 2020-12-12: qty 30, 30d supply, fill #0

## 2020-12-12 NOTE — Progress Notes (Signed)
Subjective:     Laura Marsh is a 65 y.o. female and is here for a comprehensive physical exam. The patient reports L posterior heel and achilles tendon inflammation.  Had similar symptoms in the past.  Patient endorses weight gain.  Blood sugar has been 130, 100, 160 at home.  Ozempic recently increased and Farxiga started by endocrinology.  Also taking metformin 1000 mg daily and glipizide 10 mg twice daily.  Foot exam done 06/20/2020.  BP controlled.    Social History   Socioeconomic History   Marital status: Divorced    Spouse name: Not on file   Number of children: Not on file   Years of education: Not on file   Highest education level: Not on file  Occupational History   Not on file  Tobacco Use   Smoking status: Never   Smokeless tobacco: Never  Vaping Use   Vaping Use: Never used  Substance and Sexual Activity   Alcohol use: Yes    Comment: rarely   Drug use: No   Sexual activity: Not on file  Other Topics Concern   Not on file  Social History Narrative   Right handed    Lives with husband   Social Determinants of Health   Financial Resource Strain: Not on file  Food Insecurity: Not on file  Transportation Needs: Not on file  Physical Activity: Not on file  Stress: Not on file  Social Connections: Not on file  Intimate Partner Violence: Not on file   Health Maintenance  Topic Date Due   PNEUMOCOCCAL POLYSACCHARIDE VACCINE AGE 43-64 HIGH RISK  Never done   Zoster Vaccines- Shingrix (1 of 2) Never done   OPHTHALMOLOGY EXAM  06/04/2018   PAP SMEAR-Modifier  05/06/2020   FOOT EXAM  05/08/2020   TETANUS/TDAP  09/03/2020   COVID-19 Vaccine (4 - Booster for Pfizer series) 09/22/2020   INFLUENZA VACCINE  11/03/2020   HIV Screening  06/13/2024 (Originally 12/17/1970)   HEMOGLOBIN A1C  04/26/2021   COLONOSCOPY (Pts 45-21yr Insurance coverage will need to be confirmed)  04/28/2021   MAMMOGRAM  11/05/2021   Hepatitis C Screening  Completed   Pneumococcal Vaccine  [id number] old  Aged Out   HPV VACCINES  Aged Out    The following portions of the patient's history were reviewed and updated as appropriate: allergies, current medications, past family history, past medical history, past social history, past surgical history, and problem list.  Review of Systems Pertinent items noted in HPI and remainder of comprehensive ROS otherwise negative.   Objective:    BP 118/70 (BP Location: Left Arm, Patient Position: Sitting, Cuff Size: Normal)   Pulse 99   Temp 98 F (36.7 C) (Oral)   Ht '5\' 3"'$  (1.6 m)   Wt 204 lb 12.8 oz (92.9 kg)   SpO2 97%   BMI 36.28 kg/m  General appearance: alert, cooperative, and no distress Head: Normocephalic, without obvious abnormality, atraumatic Eyes: conjunctivae/corneas clear. PERRL, EOM's intact. Fundi benign. Ears: normal TM's and external ear canals both ears Nose: Nares normal. Septum midline. Mucosa normal. No drainage or sinus tenderness. Throat: lips, mucosa, and tongue normal; teeth and gums normal Neck: no adenopathy, no carotid bruit, no JVD, supple, symmetrical, trachea midline, and thyroid not enlarged, symmetric, no tenderness/mass/nodules Lungs: clear to auscultation bilaterally Heart: regular rate and rhythm, S1, S2 normal, no murmur, click, rub or gallop Abdomen: Bowel sounds present, ND/NT, no hepatosplenomegaly Extremities: extremities normal, atraumatic, no cyanosis or edema Pulses: 2+  and symmetric Skin: Skin color, texture, turgor normal. No rashes or lesions Lymph nodes: Cervical, supraclavicular, and axillary nodes normal.  Neuro: ANO x3, CN II-XII intact Assessment:    Healthy female exam.      Plan:    Anticipatory guidance given including wearing seatbelts, smoke detectors in the home, increasing physical activity, increasing p.o. intake of water and vegetables. -We will obtain labs -Patient to schedule mammogram -Colonoscopy up-to-date, done 04/24/2020 -Discussed immunizations -Given  handout -Next CPE in 1 year See After Visit Summary for Counseling Recommendations   Hyperlipidemia associated with type 2 diabetes mellitus ([place]) -Cholesterol controlled when last checked on 12/07/2019 -Continue lifestyle modifications -Hemoglobin A1c 9.1% on 10/24/2020 -Continue follow-up with endocrinology -Continue current medications including metformin 1000 mg daily, glipizide 10 mg twice daily, Ozempic 2 mg daily, Farxiga 5 mg daily -Continue ACE and statin -Foot exam up-to-date, done 06/20/2020 - Plan: Lipid panel, Vitamin B12  Morbid obesity ([place]) -BMI 36.25 with comorbidities -Lifestyle modifications -Continue follow-up with endocrinology - Plan: CBC with Differential/Platelet, TSH, T4, Free, Vitamin B12, Vitamin D, 25-hydroxy  Essential hypertension -Controlled -Continue lifestyle modifications -Continue current medications including Norvasc 5 mg, losartan 30 mg, triamterene-hydrochlorothiazide 37.5-25 mg - Plan: CMP  Achilles tendinitis of left lower extremity -Supportive care -Stretching encouraged  Weight gain  -Continue lifestyle modifications -Continue follow-up with endocrinology  Follow-up as needed in 3 months   Update: Mild hypokalemia with potassium 3.3.  Rx for Klor-Con sent to patient's pharmacy.  Mild vitamin D deficiency.  Vitamin D 28.3.  Ergocalciferol sent to patient's pharmacy.  Hypokalemia - Plan: potassium chloride SA (KLOR-CON) 20 MEQ tablet  Vitamin D deficiency - Plan: Vitamin D, Ergocalciferol, (DRISDOL) 1.25 MG (50000 UNIT) CAPS capsule  Grier Mitts, MD

## 2020-12-13 ENCOUNTER — Other Ambulatory Visit (HOSPITAL_COMMUNITY): Payer: Self-pay

## 2020-12-16 ENCOUNTER — Encounter: Payer: Self-pay | Admitting: Family Medicine

## 2020-12-17 ENCOUNTER — Encounter: Payer: Self-pay | Admitting: Family Medicine

## 2020-12-29 ENCOUNTER — Other Ambulatory Visit: Payer: Self-pay | Admitting: Family Medicine

## 2020-12-29 ENCOUNTER — Other Ambulatory Visit (HOSPITAL_COMMUNITY): Payer: Self-pay

## 2020-12-29 DIAGNOSIS — I10 Essential (primary) hypertension: Secondary | ICD-10-CM

## 2020-12-29 MED ORDER — LISINOPRIL 30 MG PO TABS
ORAL_TABLET | Freq: Every day | ORAL | 3 refills | Status: DC
  Filled 2020-12-29: qty 90, 90d supply, fill #0
  Filled 2021-01-06 – 2021-03-25 (×2): qty 90, 90d supply, fill #1
  Filled 2021-06-18 – 2021-06-26 (×2): qty 90, 90d supply, fill #2
  Filled 2021-09-23: qty 90, 90d supply, fill #3

## 2021-01-01 ENCOUNTER — Other Ambulatory Visit (HOSPITAL_COMMUNITY): Payer: Self-pay

## 2021-01-03 ENCOUNTER — Other Ambulatory Visit (HOSPITAL_COMMUNITY): Payer: Self-pay

## 2021-01-06 ENCOUNTER — Other Ambulatory Visit (HOSPITAL_COMMUNITY): Payer: Self-pay

## 2021-01-13 ENCOUNTER — Other Ambulatory Visit (HOSPITAL_COMMUNITY): Payer: Self-pay

## 2021-01-26 ENCOUNTER — Other Ambulatory Visit (HOSPITAL_COMMUNITY): Payer: Self-pay

## 2021-01-29 ENCOUNTER — Other Ambulatory Visit (HOSPITAL_COMMUNITY): Payer: Self-pay

## 2021-01-30 ENCOUNTER — Ambulatory Visit: Payer: 59 | Admitting: Internal Medicine

## 2021-02-10 ENCOUNTER — Other Ambulatory Visit (HOSPITAL_COMMUNITY): Payer: Self-pay

## 2021-02-18 ENCOUNTER — Other Ambulatory Visit (HOSPITAL_COMMUNITY): Payer: Self-pay

## 2021-02-20 ENCOUNTER — Other Ambulatory Visit (HOSPITAL_COMMUNITY): Payer: Self-pay

## 2021-02-21 ENCOUNTER — Other Ambulatory Visit (HOSPITAL_COMMUNITY): Payer: Self-pay

## 2021-03-12 ENCOUNTER — Other Ambulatory Visit (HOSPITAL_COMMUNITY): Payer: Self-pay

## 2021-03-13 ENCOUNTER — Other Ambulatory Visit (HOSPITAL_COMMUNITY): Payer: Self-pay

## 2021-03-13 ENCOUNTER — Ambulatory Visit: Payer: 59 | Admitting: Internal Medicine

## 2021-03-13 ENCOUNTER — Encounter: Payer: Self-pay | Admitting: Internal Medicine

## 2021-03-13 ENCOUNTER — Ambulatory Visit: Payer: 59 | Admitting: Family Medicine

## 2021-03-13 ENCOUNTER — Other Ambulatory Visit: Payer: Self-pay

## 2021-03-13 VITALS — BP 110/64 | HR 104 | Resp 18 | Wt 203.4 lb

## 2021-03-13 DIAGNOSIS — E876 Hypokalemia: Secondary | ICD-10-CM

## 2021-03-13 DIAGNOSIS — E1165 Type 2 diabetes mellitus with hyperglycemia: Secondary | ICD-10-CM

## 2021-03-13 LAB — POCT GLYCOSYLATED HEMOGLOBIN (HGB A1C): Hemoglobin A1C: 8.3 % — AB (ref 4.0–5.6)

## 2021-03-13 MED ORDER — DAPAGLIFLOZIN PROPANEDIOL 10 MG PO TABS
10.0000 mg | ORAL_TABLET | Freq: Every day | ORAL | 1 refills | Status: DC
  Filled 2021-03-13: qty 90, 90d supply, fill #0
  Filled 2021-06-03: qty 90, 90d supply, fill #1

## 2021-03-13 MED FILL — Glucose Blood Test Strip: 50 days supply | Qty: 100 | Fill #0 | Status: CN

## 2021-03-13 NOTE — Progress Notes (Signed)
Name: Laura Marsh  Age/ Sex: 65 y.o., female   MRN/ DOB: [id number], 04/12/1955     PCP: Billie Ruddy, MD   Reason for Endocrinology Evaluation: Type 2 Diabetes Mellitus     Initial Endocrinology Clinic Visit: 06/16/2018    PATIENT IDENTIFIER: Laura Marsh is a 65 y.o. female with a past medical history of HTN, T2DM and Dyslipidemia. The patient has followed with Endocrinology clinic since 06/16/2018 for consultative assistance with management of her diabetes.  DIABETIC HISTORY:  Laura Marsh was diagnosed with T2DM ~ 2014. She has been on oral glycemic agents for years. She has never been on insulin therapy. Her hemoglobin A1c has ranged from 6.5% in 2014, peaking at 11.0% in 2019.   On her initial visit to our clinic she was on Metformin, Glipizide and Januvia, with an A1c 9.7 %.  We stopped Januvia and glipizide and started Ozempic with continuation of Metformin    [facility]  I have attempted to screen her for Cushing's syndrome due to spontaneous hypokalemia and weight gain in 06/2020 but she did not return the urine sample.   SUBJECTIVE:   During the last visit (06/20/2020): A1c 8.9 % . We increased glipizide and continued Metformin,  and increased Ozempic      Today (03/13/2021): Laura Marsh is here for a follow up on diabetes follow up.  She checks her blood sugars multiple times a day through the dexcom . The patient has not had hypoglycemic episodes since the last clinic visit.      Denies nausea , has alternating constipation with diarrhea  She has right buttock and lower back pain which limits exercise , ice and stretching has helped      HOME DIABETES REGIMEN:  Metformin 1000 mg Once daily Glipizide 10 mg , 1 tablet BID Ozempic 1 mg weekly Farxiga 5 mg daily   GLUCOSE LOG   93-220 mg/dL    DIABETIC COMPLICATIONS: Microvascular complications:   Denies: CKD, retinopathy, neuropathy  Last eye exam:  Completed 11/07/2020   Macrovascular complications: Denies: CAD, PVD, CVA    HISTORY:  Past Medical History:  Past Medical History:  Diagnosis Date   Colon polyp    Diabetes mellitus    Elevated cholesterol    Hypertension    Past Surgical History:  Past Surgical History:  Procedure Laterality Date   ABDOMINAL HYSTERECTOMY     COLONOSCOPY     COLONOSCOPY WITH PROPOFOL N/A 11/08/2017   Procedure: COLONOSCOPY WITH PROPOFOL;  Surgeon: Toledo, Benay Pike, MD;  Location: [facility];  Service: Gastroenterology;  Laterality: N/A;   COLONOSCOPY WITH PROPOFOL N/A 04/28/2020   Procedure: COLONOSCOPY WITH PROPOFOL;  Surgeon: Benjamine Sprague, DO;  Location: [facility] ENDOSCOPY;  Service: General;  Laterality: N/A;   Social History:  reports that she has never smoked. She has never used smokeless tobacco. She reports current alcohol use. She reports that she does not use drugs. Family History:  Family History  Problem Relation Age of Onset   Diabetes Mother    Congestive Heart Failure Mother      HOME MEDICATIONS: Allergies as of 03/13/2021       Reactions   Trulicity [dulaglutide] Itching   Rash/itching at injection site        Medication List        Accurate as of March 13, 2021  3:18 PM. If you have any questions, ask your nurse or doctor.  STOP taking these medications    diclofenac 75 MG EC tablet Commonly known as: VOLTAREN Stopped by: Dorita Sciara, MD       TAKE these medications    acetaminophen 500 MG tablet Commonly known as: TYLENOL Take 500 mg by mouth 2 (two) times daily.   Alcohol Swabs Pads Use as directed.   amLODipine 5 MG tablet Commonly known as: NORVASC TAKE 1 TABLET BY MOUTH DAILY.   aspirin 81 MG EC tablet Take 81 mg by mouth daily. Swallow whole. Taken it twice a week   atorvastatin 20 MG tablet Commonly known as: LIPITOR TAKE 1 TABLET BY MOUTH ONCE DAILY   Dexcom G6 Sensor Misc 1 Device by Does not apply route as  directed.   Dexcom G6 Sensor Misc Use as directed every 10 days (Use as directed every 10 days)   Dexcom G6 Transmitter Misc 1 Device by Does not apply route as directed.   Dexcom G6 Transmitter Misc Use as directed (Use as directed)   Farxiga 5 MG Tabs tablet Generic drug: dapagliflozin propanediol Take 1 tablet by mouth daily before breakfast.   fluticasone 50 MCG/ACT nasal spray Commonly known as: FLONASE Place 2 sprays into both nostrils daily.   FREESTYLE LITE test strip Generic drug: glucose blood USE TO TEST BLOOD SUGAR TWO TIMES DAILY   glipiZIDE 10 MG tablet Commonly known as: GLUCOTROL Take 1 tablet by mouth 2 (two) times daily before a meal.   lisinopril 30 MG tablet Commonly known as: ZESTRIL TAKE 1 TABLET BY MOUTH ONCE DAILY   loratadine 10 MG tablet Commonly known as: CLARITIN Take 10 mg by mouth daily as needed.   METAMUCIL FIBER PO Take by mouth in the morning and at bedtime.   metFORMIN 1000 MG tablet Commonly known as: GLUCOPHAGE Take 1 tablet by mouth daily with breakfast.   multivitamin with minerals Tabs tablet Take 1 tablet by mouth daily.   Omron 3 Series BP Monitor Devi USE AS DIRECTED   Ozempic (2 MG/DOSE) 8 MG/3ML Sopn Generic drug: Semaglutide (2 MG/DOSE) Inject 2 mg into the skin once a week.   potassium chloride SA 20 MEQ tablet Commonly known as: KLOR-CON M Take 1 tablet (20 mEq total) by mouth daily.   triamterene-hydrochlorothiazide 37.5-25 MG tablet Commonly known as: MAXZIDE-25 Take 1 tablet by mouth daily.   Vitamin D (Ergocalciferol) 1.25 MG (50000 UNIT) Caps capsule Commonly known as: DRISDOL Take 1 capsule (50,000 Units total) by mouth every 7 (seven) days.      PHYSICAL EXAM: VS: BP 110/64   Pulse (!) 104   Resp 18   Wt 203 lb 6.4 oz (92.3 kg)   SpO2 96%   BMI 36.03 kg/m    EXAM: General: Pt appears well and is in NAD  Lungs: Clear with good BS bilat with no rales, rhonchi, or wheezes  Heart:  Auscultation: RRR  Periph. circulation: non pitting edema  Mental Status: Judgment, insight: intact Orientation: oriented to time, place, and person Mood and affect: no depression, anxiety, or agitation    DM Foot exam 06/20/2020  The skin of the feet is intact without sores or ulcerations. The pedal pulses are 2+ on right and 2+ on left. The sensation is intact to a screening 5.07, 10 gram monofilament bilaterally    DATA REVIEWED:  Lab Results  Component Value Date   HGBA1C 8.3 (A) 03/13/2021   HGBA1C 9.1 (A) 10/24/2020   HGBA1C 8.9 (A) 06/20/2020      Lab  Results  Component Value Date   CHOL 172 12/12/2020   HDL 50.90 12/12/2020   LDLCALC 101 (H) 12/12/2020   LDLDIRECT 140.3 02/19/2013   TRIG 100.0 12/12/2020   CHOLHDL 3 12/12/2020        Latest Reference Range & Units 03/13/21 16:10  Sodium 135 - 146 mmol/L 141  Potassium 3.5 - 5.3 mmol/L 3.8  Chloride 98 - 110 mmol/L 103  CO2 20 - 32 mmol/L 25  Glucose 65 - 99 mg/dL 143 (H)  BUN 7 - 25 mg/dL 16  Creatinine 0.50 - 1.05 mg/dL 0.89  Calcium 8.6 - 10.4 mg/dL 10.4  BUN/Creatinine Ratio 6 - 22 (calc) NOT APPLICABLE  (H): Data is abnormally high  ASSESSMENT / PLAN / RECOMMENDATIONS:   1) Type 2 Diabetes Mellitus, Poorly controlled, Without complications - Most recent A1c of 8.3 %. Goal A1c < 7.0 %.     -A1c has improved from 9.1% to 8.3% -She is unable to increase glipizide due to constipation in the past - She is intolerant to higher doses of metformin -She is tolerating [place] well except for frequency - She had declined RD referral in the past   MEDICATIONS: Continue Metformin 1000 mg Once daily Continue glipizide 10 mg, 1 tablet before Breakfast and 1 tablet before Supper Continue Ozempic to 2 mg daily  Increase Farxiga to 10 mg daily      EDUCATION / INSTRUCTIONS: BG monitoring instructions: Patient is instructed to check her blood sugars 2 times a day, fasting and bedtime. Call [place]  Endocr[facility]ology clinic if: BG persistently < 70  I reviewed the Rule of 15 for the treatment of hypoglycemia in detail with the patient. Literature supplied.  2) Diabetic complications:  Eye: Does not have known diabetic retinopathy.  Neuro/ Feet: Does not have known diabetic peripheral neuropathy. Renal: Patient does not have known baseline CKD. She is  on an ACEI/ARB at present.  Micro albumin urea testing is normal and up-to-date      3) Hypokalemia:   -Most likely caused by HCTZ intake, but would like to rule secondary causes such as Cushing syndrome or hyperaldosteronism -I have attempted to screen her for Cushing's in the past but she has not done the 24-hour urine, but she has not returned this.  We will proceed with saliva cortisol testing -We will also check Aldo and renin today -We will consider adjusting her diuretic therapy to only potassium sparing agent, this also may improve her urinary frequency in the setting of Farxiga intake   Medication Continue KCl 20 M EQ daily   F/U in  4 months    Signed electronically by: Mack Guise, MD  [facility]  [facility] [address] [place], [place] [address] Phone: [phone] FAX: [phone]   CC: Billie Ruddy, [place] [address] Phone: [phone]  Fax: [phone]  Return to Endocrinology clinic as below: No future appointments.

## 2021-03-13 NOTE — Patient Instructions (Addendum)
-  Keep Up the Good Work ! - Continue Glipizide 10 mg, 1 tablet before Breakfast and 1 tablet before Supper - Continue Metformin 1000 mg daily  - Continue  Ozempic  2 mg weekly   - Increase Farxiga 10  mg, 1 tablet every morning    HOW TO TREAT LOW BLOOD SUGARS (Blood sugar LESS THAN 70 MG/DL) Please follow the RULE OF 15 for the treatment of hypoglycemia treatment (when your (blood sugars are less than 70 mg/dL)   STEP 1: Take 15 grams of carbohydrates when your blood sugar is low, which includes:  3-4 GLUCOSE TABS  OR 3-4 OZ OF JUICE OR REGULAR SODA OR ONE TUBE OF GLUCOSE GEL    STEP 2: RECHECK blood sugar in 15 MINUTES STEP 3: If your blood sugar is still low at the 15 minute recheck --> then, go back to STEP 1 and treat AGAIN with another 15 grams of carbohydrates.    SALIVARY CORTISOL COLLECTION INSTRUCTIONS    Precautions:  Please collect sample at [facility]. You will need to do this on 2 nights  No food or fluids 30 minutes prior to collection.  Do not use any creams, lotions on hands, or use steroid inhalers 24- hours prior to collection.  Wash hands carefully.  Avoid any activity that could cause your gums to bleed: including flushing of brushing your teeth.  Kit must not be used in children less than 31 years of age, or a person that is at risk for choking on collection kit.  Instructions for saliva collection:   Rinse mouth thoroughly with water and discard. Do not swallow.  Hold the Salivette at the rim of the suspended insert and remove the stopper.  Remove the swab.  Place swab under tongue until well saturated, approximately 1 minute.   Return the saturated swab to the suspended insert and close the Salivette firmly with the stopper.  Do not remove the tube holding the insert. The Salivette should be sent to the lab with the swab.   Come to the lab and leave the Salivette kit for labeling with your identifying information.   Make sure you refrigerate sample if  not bringing to the lab immediately. Try to use cold packs for transportation if available.

## 2021-03-14 ENCOUNTER — Other Ambulatory Visit (HOSPITAL_COMMUNITY): Payer: Self-pay

## 2021-03-18 LAB — ALDOSTERONE + RENIN ACTIVITY W/ RATIO
ALDO / PRA Ratio: 6 Ratio (ref 0.9–28.9)
Aldosterone: 12 ng/dL
Renin Activity: 2.01 ng/mL/h (ref 0.25–5.82)

## 2021-03-18 LAB — BASIC METABOLIC PANEL
BUN: 16 mg/dL (ref 7–25)
CO2: 25 mmol/L (ref 20–32)
Calcium: 10.4 mg/dL (ref 8.6–10.4)
Chloride: 103 mmol/L (ref 98–110)
Creat: 0.89 mg/dL (ref 0.50–1.05)
Glucose, Bld: 143 mg/dL — ABNORMAL HIGH (ref 65–99)
Potassium: 3.8 mmol/L (ref 3.5–5.3)
Sodium: 141 mmol/L (ref 135–146)

## 2021-03-20 ENCOUNTER — Other Ambulatory Visit (HOSPITAL_COMMUNITY): Payer: Self-pay

## 2021-03-24 ENCOUNTER — Other Ambulatory Visit (HOSPITAL_COMMUNITY): Payer: Self-pay

## 2021-03-25 ENCOUNTER — Other Ambulatory Visit (HOSPITAL_COMMUNITY): Payer: Self-pay

## 2021-03-31 ENCOUNTER — Telehealth: Payer: Self-pay | Admitting: Family Medicine

## 2021-03-31 ENCOUNTER — Other Ambulatory Visit (HOSPITAL_COMMUNITY): Payer: Self-pay

## 2021-03-31 ENCOUNTER — Telehealth (INDEPENDENT_AMBULATORY_CARE_PROVIDER_SITE_OTHER): Payer: 59 | Admitting: Family Medicine

## 2021-03-31 ENCOUNTER — Encounter: Payer: Self-pay | Admitting: Family Medicine

## 2021-03-31 DIAGNOSIS — R059 Cough, unspecified: Secondary | ICD-10-CM | POA: Diagnosis not present

## 2021-03-31 MED ORDER — BENZONATATE 100 MG PO CAPS
ORAL_CAPSULE | ORAL | 0 refills | Status: DC
  Filled 2021-03-31: qty 30, 4d supply, fill #0

## 2021-03-31 NOTE — Patient Instructions (Signed)
° ° ° °--------------------------------------------------------------------------------------------------------------------------- ° ° ° °  WORK SLIP:  Patient Laura Marsh,  24-Jul-1955, was seen for a medical visit today, 03/31/21 . Please excuse from work for a COVID/flu like illness. If Covid19 testing is positive advise 10 days minimum from the onset of symptoms (04/30/20) PLUS 1 day of no fever and improved symptoms - covid is usually contagious for about 7-10 days. Will defer to employer for a sooner return to work if patient has 2 negative covid tests 24-48 hours apart and is feeling better, or if symptoms have resolved, it is greater than 5 days since the positive test and the patient can wear a high-quality, tight fitting mask such as N95 or KN95 at all times for an additional 5 days.   Sincerely: E-signature: Dr. Colin Benton, DO [facility] Ph: [phone]   ------------------------------------------------------------------------------------------------------------------------------   HOME CARE TIPS:  -COVID19 testing information: [email or web address]  Most pharmacies also offer testing and home test kits. If the Covid19 test is positive and you desire antiviral treatment, please contact a [facility] or schedule a follow up virtual visit through your primary care office or through the [facility].  Other test to treat options: [email or web address].[email or web address]  -I sent the medication(s) we discussed to your pharmacy: Meds ordered this encounter  Medications   benzonatate (TESSALON PERLES) 100 MG capsule    Sig: Take 1 to 2 capsules up to twice daily as needed for cough    Dispense:  30 capsule    Refill:  0    -can use tylenol if needed for fevers, aches and pains per instructions  -can use nasal saline a few times per day if you have nasal congestion  -stay hydrated, drink plenty of fluids  and eat small healthy meals - avoid dairy  -can take 1000 IU (8mcg) Vit D3 and 100-500 mg of Vit C daily per instructions  -If the Covid test is positive, check out the CDC website for more information on home care, transmission and treatment for COVID19  -follow up with your doctor in 2-3 days unless improving and feeling better  -stay home while sick, except to seek medical care. If you have COVID19, you will likely be contagious for 7-10 days. Flu or Influenza is likely contagious for about 7 days. Other respiratory viral infections remain contagious for 5-10+ days depending on the virus and many other factors. Wear a good mask that fits snugly (such as N95 or KN95) if around others to reduce the risk of transmission.  It was nice to meet you today, and I really hope you are feeling better soon. I help [facility] out with telemedicine visits on Tuesdays and Thursdays and am happy to help if you need a follow up virtual visit on those days. Otherwise, if you have any concerns or questions following this visit please schedule a follow up visit with your Primary Care doctor or seek care at a local urgent care clinic to avoid delays in care.    Seek in person care or schedule a follow up video visit promptly if your symptoms worsen, new concerns arise or you are not improving with treatment. Call 911 and/or seek emergency care if your symptoms are severe or life threatening.

## 2021-03-31 NOTE — Progress Notes (Signed)
Virtual Visit via Video Note  I connected with [facility]  on 03/31/21 at 11:00 AM EST by a video enabled telemedicine application and verified that I am speaking with the correct person using two identifiers.  Location patient: home, [place] Location provider:work or home office Persons participating in the virtual visit: patient, provider  I discussed the limitations of evaluation and management by telemedicine and the availability of in person appointments. The patient expressed understanding and agreed to proceed.   HPI:  Acute telemedicine visit for cough: -Onset: last night -Symptoms include: cough, congestion, pnd - yellow mucus, mild body aches -Denies: fever, CP, SOB, NVD -granddaughter was sick with a cold -Has tried:OTC alk plus -Pertinent past medical history: see below -Pertinent medication allergies:  Allergies  Allergen Reactions   Trulicity [Dulaglutide] Itching    Rash/itching at injection site  -COVID-19 vaccine status:  Immunization History  Administered Date(s) Administered   Influenza-Unspecified 04/05/2016, 01/03/2018, 02/03/2021   PFIZER(Purple Top)SARS-COV-2 Vaccination 04/14/2019, 05/03/2019, 05/25/2020    ROS: See pertinent positives and negatives per HPI.  Past Medical History:  Diagnosis Date   Colon polyp    Diabetes mellitus    Elevated cholesterol    Hypertension     Past Surgical History:  Procedure Laterality Date   ABDOMINAL HYSTERECTOMY     COLONOSCOPY     COLONOSCOPY WITH PROPOFOL N/A 11/08/2017   Procedure: COLONOSCOPY WITH PROPOFOL;  Surgeon: Toledo, Benay Pike, MD;  Location: [facility] ENDOSCOPY;  Service: Gastroenterology;  Laterality: N/A;   COLONOSCOPY WITH PROPOFOL N/A 04/28/2020   Procedure: COLONOSCOPY WITH PROPOFOL;  Surgeon: Benjamine Sprague, DO;  Location: [facility] ENDOSCOPY;  Service: General;  Laterality: N/A;     Current Outpatient Medications:    acetaminophen (TYLENOL) 500 MG tablet, Take 500 mg by mouth as needed., Disp: , Rfl:     Alcohol Swabs PADS, Use as directed., Disp: 100 each, Rfl: 12   amLODipine (NORVASC) 5 MG tablet, TAKE 1 TABLET BY MOUTH DAILY., Disp: 90 tablet, Rfl: 2   aspirin 81 MG EC tablet, Take 81 mg by mouth daily. Swallow whole. Taken it twice a week, Disp: , Rfl:    atorvastatin (LIPITOR) 20 MG tablet, TAKE 1 TABLET BY MOUTH ONCE DAILY, Disp: 90 tablet, Rfl: 3   benzonatate (TESSALON PERLES) 100 MG capsule, Take 1 to 2 capsules up to twice daily as needed for cough, Disp: 30 capsule, Rfl: 0   Blood Pressure Monitoring (OMRON 3 SERIES BP MONITOR) DEVI, USE AS DIRECTED, Disp: 1 each, Rfl: 0   Continuous Blood Gluc Sensor (DEXCOM G6 SENSOR) MISC, 1 Device by Does not apply route as directed., Disp: 9 each, Rfl: 3   Continuous Blood Gluc Sensor (DEXCOM G6 SENSOR) MISC, Use as directed every 10 days, Disp: 3 each, Rfl: 11   Continuous Blood Gluc Transmit (DEXCOM G6 TRANSMITTER) MISC, 1 Device by Does not apply route as directed., Disp: 1 each, Rfl: 3   Continuous Blood Gluc Transmit (DEXCOM G6 TRANSMITTER) MISC, Use as directed, Disp: 1 each, Rfl: 3   dapagliflozin propanediol (FARXIGA) 10 MG TABS tablet, Take 1 tablet by mouth daily before breakfast., Disp: 90 tablet, Rfl: 1   fluticasone (FLONASE) 50 MCG/ACT nasal spray, Place 2 sprays into both nostrils daily., Disp: 16 g, Rfl: 5   glipiZIDE (GLUCOTROL) 10 MG tablet, Take 1 tablet by mouth 2 (two) times daily before a meal., Disp: 180 tablet, Rfl: 3   glucose blood test strip, Use to test blood sugar 2 (two) times daily., Disp: 100  strip, Rfl: 3   lisinopril (ZESTRIL) 30 MG tablet, TAKE 1 TABLET BY MOUTH ONCE DAILY, Disp: 90 tablet, Rfl: 3   loratadine (CLARITIN) 10 MG tablet, Take 10 mg by mouth daily as needed., Disp: , Rfl:    METAMUCIL FIBER PO, Take by mouth in the morning and at bedtime., Disp: , Rfl:    metFORMIN (GLUCOPHAGE) 1000 MG tablet, Take 1 tablet by mouth daily with breakfast., Disp: 90 tablet, Rfl: 3   Multiple Vitamin (MULTIVITAMIN WITH  MINERALS) TABS tablet, Take 1 tablet by mouth daily., Disp: , Rfl:    potassium chloride SA (KLOR-CON) 20 MEQ tablet, Take 1 tablet (20 mEq total) by mouth daily., Disp: 30 tablet, Rfl: 1   Semaglutide, 2 MG/DOSE, (OZEMPIC, 2 MG/DOSE,) 8 MG/3ML SOPN, Inject 2 mg into the skin once a week., Disp: 9 mL, Rfl: 3   triamterene-hydrochlorothiazide (MAXZIDE-25) 37.5-25 MG tablet, Take 1 tablet by mouth daily., Disp: 90 tablet, Rfl: 1   Vitamin D, Ergocalciferol, (DRISDOL) 1.25 MG (50000 UNIT) CAPS capsule, Take 1 capsule (50,000 Units total) by mouth every 7 (seven) days., Disp: 12 capsule, Rfl: 0  EXAM:  VITALS per patient if applicable:  GENERAL: alert, oriented, appears well and in no acute distress  HEENT: atraumatic, conjunttiva clear, no obvious abnormalities on inspection of external nose and ears  NECK: normal movements of the head and neck  LUNGS: on inspection no signs of respiratory distress, breathing rate appears normal, no obvious gross SOB, gasping or wheezing  CV: no obvious cyanosis  MS: moves all visible extremities without noticeable abnormality  PSYCH/NEURO: pleasant and cooperative, no obvious depression or anxiety, speech and thought processing grossly intact  ASSESSMENT AND PLAN:  Discussed the following assessment and plan:  Cough, unspecified type  -we discussed possible serious and likely etiologies, options for evaluation and workup, limitations of telemedicine visit vs in person visit, treatment, treatment risks and precautions. Pt is agreeable to treatment via telemedicine at this moment. Query [facility], covid vs other. Possible mild flu. She opted for covid testing, Cough rx sent and symptomatic care measures per patient instructions. She is aware can contact [facility] or do vv if positive covid test and wants to discuss antiviral in 1st 5 days of symptoms.  Work/School slipped offered: provided in patient instructions   Advised to seek prompt vv follow up  or in person care if worsening, new symptoms arise, or if is not improving with treatment as expected. Discussed options for inperson care if PCP office not available. Did let this patient know that I only do telemedicine on Tuesdays and Thursdays for [place]. Advised to schedule follow up visit with PCP or [facility] if any further questions or concerns to avoid delays in care.   I discussed the assessment and treatment plan with the patient. The patient was provided an opportunity to ask questions and all were answered. The patient agreed with the plan and demonstrated an understanding of the instructions.     Lucretia Kern, DO

## 2021-03-31 NOTE — Telephone Encounter (Signed)
Patient called requesting visit for cough, congestion. Patient has not tested for COVID has had symptoms for 1 day.  Patient is scheduled to see Maudie Mercury

## 2021-04-02 ENCOUNTER — Encounter: Payer: Self-pay | Admitting: Family

## 2021-04-02 ENCOUNTER — Other Ambulatory Visit: Payer: Self-pay

## 2021-04-02 ENCOUNTER — Ambulatory Visit: Payer: 59 | Admitting: Family

## 2021-04-02 VITALS — BP 127/84 | HR 114 | Temp 98.6°F | Ht 63.0 in | Wt 204.2 lb

## 2021-04-02 DIAGNOSIS — R6889 Other general symptoms and signs: Secondary | ICD-10-CM

## 2021-04-02 LAB — POCT INFLUENZA A/B
Influenza A, POC: NEGATIVE
Influenza B, POC: NEGATIVE

## 2021-04-02 NOTE — Patient Instructions (Addendum)
It was nice to see you today!  As discussed, get some rest and increase your Flonase nasal spray to 2 sprays twice a day to help reduce your sinus drainage and postnasal drip which can be contributing to your cough, and continue the generic Claritin daily. Continue taking 2 Tessalon pearles several times a day to help reduce your coughing.  Drink at least 2 liters of water daily!   PLEASE NOTE:  If you had any lab tests please let us know if you have not heard back within a few days. You may see your results on MyChart before we have a chance to review them but we will give you a call once they are reviewed by [place]. If we ordered any referrals today, please let us know if you have not heard from their office within the next week.   Please try these tips to maintain a healthy lifestyle:  Eat most of your calories during the day when you are active. Eliminate processed foods including packaged sweets (pies, cakes, cookies), reduce intake of potatoes, white bread, white pasta, and white rice. Look for whole grain options, oat flour or almond flour.  Each meal should contain half fruits/vegetables, one quarter protein, and one quarter carbs (no bigger than a computer mouse).  Cut down on sweet beverages. This includes juice, soda, and sweet tea. Also watch fruit intake, though this is a healthier sweet option, it still contains natural sugar! Limit to 3 servings daily.  Drink at least 1 glass of water with each meal and aim for at least 8 glasses per day  Exercise at least 150 minutes every week.

## 2021-04-02 NOTE — Progress Notes (Signed)
Subjective:     Patient ID: Laura Marsh, female    DOB: July 23, 1955, 65 y.o.   MRN: [id number]  Chief Complaint  Patient presents with   Cough    Started Monday. She tested for Covid Wednesday and Thursday, both were negative. Mild SOB with coughing. She denies fever. She was prescribed Tessalon pereles on Tuesday. Symptoms have not subsided.    Nasal Congestion   Chills    HPI: Upper Respiratory Infection: Symptoms include congestion, nasal congestion, post nasal drip, and nasal drainage .  Onset of symptoms was 3 days ago, unchanged since that time. She is not drinking much. Evaluation to date: none.  Treatment to date: none.   Health Maintenance Due  Topic Date Due   Pneumococcal Vaccine 27-87 Years old (1 - PCV) Never done   Zoster Vaccines- Shingrix (1 of 2) Never done   PAP SMEAR-Modifier  05/06/2020   FOOT EXAM  05/08/2020   TETANUS/TDAP  09/03/2020    Past Medical History:  Diagnosis Date   Colon polyp    Diabetes mellitus    Elevated cholesterol    Hypertension     Past Surgical History:  Procedure Laterality Date   ABDOMINAL HYSTERECTOMY     COLONOSCOPY     COLONOSCOPY WITH PROPOFOL N/A 11/08/2017   Procedure: COLONOSCOPY WITH PROPOFOL;  Surgeon: Toledo, Benay Pike, MD;  Location: [facility];  Service: Gastroenterology;  Laterality: N/A;   COLONOSCOPY WITH PROPOFOL N/A 04/28/2020   Procedure: COLONOSCOPY WITH PROPOFOL;  Surgeon: Benjamine Sprague, DO;  Location: [facility];  Service: General;  Laterality: N/A;    Outpatient Medications Prior to Visit  Medication Sig Dispense Refill   acetaminophen (TYLENOL) 500 MG tablet Take 500 mg by mouth as needed.     Alcohol Swabs PADS Use as directed. 100 each 12   amLODipine (NORVASC) 5 MG tablet TAKE 1 TABLET BY MOUTH DAILY. 90 tablet 2   aspirin 81 MG EC tablet Take 81 mg by mouth daily. Swallow whole. Taken it twice a week     benzonatate (TESSALON PERLES) 100 MG capsule Take 1 to 2 capsules up to twice  daily as needed for cough 30 capsule 0   Blood Pressure Monitoring (OMRON 3 SERIES BP MONITOR) DEVI USE AS DIRECTED 1 each 0   Continuous Blood Gluc Sensor (DEXCOM G6 SENSOR) MISC 1 Device by Does not apply route as directed. 9 each 3   Continuous Blood Gluc Sensor (DEXCOM G6 SENSOR) MISC Use as directed every 10 days 3 each 11   Continuous Blood Gluc Transmit (DEXCOM G6 TRANSMITTER) MISC 1 Device by Does not apply route as directed. 1 each 3   Continuous Blood Gluc Transmit (DEXCOM G6 TRANSMITTER) MISC Use as directed 1 each 3   dapagliflozin propanediol (FARXIGA) 10 MG TABS tablet Take 1 tablet by mouth daily before breakfast. 90 tablet 1   fluticasone (FLONASE) 50 MCG/ACT nasal spray Place 2 sprays into both nostrils daily. 16 g 5   glipiZIDE (GLUCOTROL) 10 MG tablet Take 1 tablet by mouth 2 (two) times daily before a meal. 180 tablet 3   glucose blood test strip Use to test blood sugar 2 (two) times daily. 100 strip 3   lisinopril (ZESTRIL) 30 MG tablet TAKE 1 TABLET BY MOUTH ONCE DAILY 90 tablet 3   loratadine (CLARITIN) 10 MG tablet Take 10 mg by mouth daily as needed.     METAMUCIL FIBER PO Take by mouth in the morning and at bedtime.  metFORMIN (GLUCOPHAGE) 1000 MG tablet Take 1 tablet by mouth daily with breakfast. 90 tablet 3   Multiple Vitamin (MULTIVITAMIN WITH MINERALS) TABS tablet Take 1 tablet by mouth daily.     potassium chloride SA (KLOR-CON) 20 MEQ tablet Take 1 tablet (20 mEq total) by mouth daily. 30 tablet 1   Semaglutide, 2 MG/DOSE, (OZEMPIC, 2 MG/DOSE,) 8 MG/3ML SOPN Inject 2 mg into the skin once a week. 9 mL 3   triamterene-hydrochlorothiazide (MAXZIDE-25) 37.5-25 MG tablet Take 1 tablet by mouth daily. 90 tablet 1   Vitamin D, Ergocalciferol, (DRISDOL) 1.25 MG (50000 UNIT) CAPS capsule Take 1 capsule (50,000 Units total) by mouth every 7 (seven) days. 12 capsule 0   atorvastatin (LIPITOR) 20 MG tablet TAKE 1 TABLET BY MOUTH ONCE DAILY 90 tablet 3   No  facility-administered medications prior to visit.    Allergies  Allergen Reactions   Trulicity [Dulaglutide] Itching    Rash/itching at injection site        Objective:    Physical Exam Vitals and nursing note reviewed.  Constitutional:      Appearance: Normal appearance.  HENT:     Right Ear: Tympanic membrane and ear canal normal.     Left Ear: Tympanic membrane and ear canal normal.     Mouth/Throat:     Mouth: Mucous membranes are moist.     Pharynx: Oropharyngeal exudate and posterior oropharyngeal erythema present. No pharyngeal swelling.  Cardiovascular:     Rate and Rhythm: Normal rate and regular rhythm.  Pulmonary:     Effort: Pulmonary effort is normal.     Breath sounds: Normal breath sounds.  Musculoskeletal:        General: Normal range of motion.  Lymphadenopathy:     Cervical:     Right cervical: No superficial or posterior cervical adenopathy.    Left cervical: No superficial or posterior cervical adenopathy.  Skin:    General: Skin is warm and dry.  Neurological:     Mental Status: She is alert.  Psychiatric:        Mood and Affect: Mood normal.        Behavior: Behavior normal.    BP 127/84    Pulse (!) 114    Temp 98.6 F (37 C) (Temporal)    Ht 5\' 3"  (1.6 m)    Wt 204 lb 3.2 oz (92.6 kg)    SpO2 94%    BMI 36.17 kg/m  Wt Readings from Last 3 Encounters:  04/02/21 204 lb 3.2 oz (92.6 kg)  03/13/21 203 lb 6.4 oz (92.3 kg)  12/12/20 204 lb 12.8 oz (92.9 kg)       Assessment & Plan:   Problem List Items Addressed This Visit       Other   Flu-like symptoms - Primary    rapid flu neg. Continue home sinus/cold medications including generic Flonase, increasing to bid, daily Claritin, and generic Tylenol 1,000mg  every 6 hours. Increase water intake to at least 2 liters daily.        Relevant Orders   POCT Influenza A/B (Completed)

## 2021-04-02 NOTE — Assessment & Plan Note (Signed)
rapid flu neg. Continue home sinus/cold medications including generic Flonase, increasing to bid, daily Claritin, and generic Tylenol 1,000mg  every 6 hours. Increase water intake to at least 2 liters daily.

## 2021-04-05 ENCOUNTER — Other Ambulatory Visit: Payer: Self-pay

## 2021-04-05 ENCOUNTER — Encounter (HOSPITAL_COMMUNITY): Payer: Self-pay | Admitting: Emergency Medicine

## 2021-04-05 ENCOUNTER — Ambulatory Visit (INDEPENDENT_AMBULATORY_CARE_PROVIDER_SITE_OTHER): Payer: 59

## 2021-04-05 ENCOUNTER — Ambulatory Visit (HOSPITAL_COMMUNITY)
Admission: EM | Admit: 2021-04-05 | Discharge: 2021-04-05 | Disposition: A | Discharge status: Home / Self Care | Payer: 59 | Attending: Physician Assistant | Admitting: Physician Assistant

## 2021-04-05 ENCOUNTER — Telehealth (HOSPITAL_COMMUNITY): Payer: Self-pay

## 2021-04-05 DIAGNOSIS — J329 Chronic sinusitis, unspecified: Secondary | ICD-10-CM

## 2021-04-05 DIAGNOSIS — R051 Acute cough: Secondary | ICD-10-CM

## 2021-04-05 DIAGNOSIS — J40 Bronchitis, not specified as acute or chronic: Secondary | ICD-10-CM | POA: Diagnosis not present

## 2021-04-05 DIAGNOSIS — R059 Cough, unspecified: Secondary | ICD-10-CM | POA: Diagnosis not present

## 2021-04-05 DIAGNOSIS — J9811 Atelectasis: Secondary | ICD-10-CM | POA: Diagnosis not present

## 2021-04-05 DIAGNOSIS — R0602 Shortness of breath: Secondary | ICD-10-CM

## 2021-04-05 MED ORDER — ALBUTEROL SULFATE HFA 108 (90 BASE) MCG/ACT IN AERS: 1.0000 | INHALATION_SPRAY | Freq: Four times a day (QID) | RESPIRATORY_TRACT | 0 refills | Status: DC | PRN

## 2021-04-05 MED ORDER — AMOXICILLIN-POT CLAVULANATE 875-125 MG PO TABS: 1.0000 | ORAL_TABLET | Freq: Two times a day (BID) | ORAL | 0 refills | Status: DC

## 2021-04-05 MED ORDER — PROMETHAZINE-DM 6.25-15 MG/5ML PO SYRP: 5.0000 mL | ORAL_SOLUTION | Freq: Three times a day (TID) | ORAL | 0 refills | Status: DC | PRN

## 2021-04-05 MED ORDER — ALBUTEROL SULFATE HFA 108 (90 BASE) MCG/ACT IN AERS: 1.0000 | INHALATION_SPRAY | Freq: Four times a day (QID) | RESPIRATORY_TRACT | 0 refills | Status: AC | PRN

## 2021-04-05 NOTE — Discharge Instructions (Signed)
Your chest x-ray was normal.  We are going to cover you for a sinobronchitis.  Take Augmentin twice daily for 7 days.  I have called in albuterol inhaler to have on hand and you can use this every 4-6 hours as needed for shortness of breath and coughing fits.  Use Promethazine DM up to 3 times a day as needed for cough.  This will make you sleepy so do not drive or drink alcohol while taking it.  You can use Mucinex, Flonase, Tylenol for symptom relief.  If anything worsens you develop worsening shortness of breath, severe cough, fever not responding to medication, nausea/vomiting interfering with oral intake, chest pain you need to go to the emergency room.  If symptoms are not improving within a few days please follow-up here or with your PCP.

## 2021-04-05 NOTE — Telephone Encounter (Signed)
Received call from patient, pharmacy did not have her medication today. I have resent the medications to [facility].

## 2021-04-05 NOTE — ED Triage Notes (Signed)
03/30/2021 symptoms started.  03/31/2021 had a virtual visit with pcp.  Was prescribed tessalon and instructed to do covid tests x 2, both negative.  04/02/2021 went to the office when having fever, productive cough.  Flu test was negative.  No new prescriptions.  Continue Tylenol and Claritin.    Patient continues to have a productive cough, but more difficulty coughing up phlegm.  Cough at night is keeping patient awake.  Patient is exhausted.

## 2021-04-05 NOTE — ED Provider Notes (Signed)
[place]    CSN: [id number] Arrival date & time: 04/05/21  1022      History   Chief Complaint Chief Complaint  Patient presents with   Cough    HPI Laura Marsh is a 66 y.o. female.   Patient presents today with a weeklong history of URI symptoms.  Reports nasal congestion, severe cough, shortness of breath.  Reports she was initially having fever and body aches but the symptoms have resolved.  Denies any chest pain, nausea, vomiting.  She has been seen by her PCP who did a rapid flu that was negative.  She has taken multiple at-home COVID test that were all negative.  She is up-to-date on COVID-19 and influenza vaccines.  She denies any recent antibiotics.  She has tried [employer] as prescribed as well as Coricidin without improvement of symptoms.  She does have a history of allergies and is taking antihistamine as needed.  She does report occasional bronchitis but does not currently have albuterol inhaler available.  Denies history of asthma, smoking, COPD.  She reports cough is interfering with her ability to sleep and she is exhausted.   Past Medical History:  Diagnosis Date   Colon polyp    Diabetes mellitus    Elevated cholesterol    Hypertension     Patient Active Problem List   Diagnosis Date Noted   Flu-like symptoms 04/02/2021   Hyperlipidemia associated with type 2 diabetes mellitus ([place]) 08/25/2017   Morbid obesity ([place]) 08/25/2017   Ganglion cyst of wrist 12/25/2014   De Quervain's disease (tenosynovitis) 06/25/2014   Hypertension associated with diabetes ([place]) 04/13/2012   Diabetes ([place]) 04/13/2012    Past Surgical History:  Procedure Laterality Date   ABDOMINAL HYSTERECTOMY     COLONOSCOPY     COLONOSCOPY WITH PROPOFOL N/A 11/08/2017   Procedure: COLONOSCOPY WITH PROPOFOL;  Surgeon: Toledo, Benay Pike, MD;  Location: [facility] ENDOSCOPY;  Service: Gastroenterology;  Laterality: N/A;   COLONOSCOPY WITH PROPOFOL N/A 04/28/2020   Procedure:  COLONOSCOPY WITH PROPOFOL;  Surgeon: Benjamine Sprague, DO;  Location: [facility] ENDOSCOPY;  Service: General;  Laterality: N/A;    OB History   No obstetric history on file.      Home Medications    Prior to Admission medications   Medication Sig Start Date End Date Taking? Authorizing Provider  albuterol (VENTOLIN HFA) 108 (90 Base) MCG/ACT inhaler Inhale 1-2 puffs into the lungs every 6 (six) hours as needed for wheezing or shortness of breath. 04/05/21  Yes Anette Barra K, PA-C  amoxicillin-clavulanate (AUGMENTIN) 875-125 MG tablet Take 1 tablet by mouth every 12 (twelve) hours. 04/05/21  Yes [employer] Null K, PA-C  promethazine-dextromethorphan (PROMETHAZINE-DM) 6.25-15 MG/5ML syrup Take 5 mLs by mouth 3 (three) times daily as needed for cough. 04/05/21  Yes Kariyah Baugh K, PA-C  acetaminophen (TYLENOL) 500 MG tablet Take 500 mg by mouth as needed.    [provider]  Alcohol Swabs PADS Use as directed. 10/23/12   Lucretia Kern, DO  amLODipine (NORVASC) 5 MG tablet TAKE 1 TABLET BY MOUTH DAILY. 08/26/20 08/26/21  Billie Ruddy, MD  aspirin 81 MG EC tablet Take 81 mg by mouth daily. Swallow whole. Taken it twice a week    [provider]  atorvastatin (LIPITOR) 20 MG tablet TAKE 1 TABLET BY MOUTH ONCE DAILY 03/31/20 03/31/21  Billie Ruddy, MD  benzonatate (TESSALON PERLES) 100 MG capsule Take 1 to 2 capsules up to twice daily as needed for cough  03/31/21   Lucretia Kern, DO  Blood Pressure Monitoring (OMRON 3 SERIES BP MONITOR) DEVI USE AS DIRECTED 04/12/20 04/12/21    Continuous Blood Gluc Sensor (DEXCOM G6 SENSOR) MISC 1 Device by Does not apply route as directed. 06/20/20   Shamleffer, Melanie Crazier, MD  Continuous Blood Gluc Sensor (DEXCOM G6 SENSOR) MISC Use as directed every 10 days 06/20/20   Shamleffer, Melanie Crazier, MD  Continuous Blood Gluc Transmit (DEXCOM G6 TRANSMITTER) MISC 1 Device by Does not apply route as directed. 06/20/20   Shamleffer, Melanie Crazier, MD   Continuous Blood Gluc Transmit (DEXCOM G6 TRANSMITTER) MISC Use as directed 06/20/20   Shamleffer, Melanie Crazier, MD  dapagliflozin propanediol (FARXIGA) 10 MG TABS tablet Take 1 tablet by mouth daily before breakfast. 03/13/21   Shamleffer, Melanie Crazier, MD  fluticasone (FLONASE) 50 MCG/ACT nasal spray Place 2 sprays into both nostrils daily. 08/02/17   Lucretia Kern, DO  glipiZIDE (GLUCOTROL) 10 MG tablet Take 1 tablet by mouth 2 (two) times daily before a meal. 10/24/20   Shamleffer, Melanie Crazier, MD  glucose blood test strip Use to test blood sugar 2 (two) times daily. 04/11/20   Shamleffer, Melanie Crazier, MD  lisinopril (ZESTRIL) 30 MG tablet TAKE 1 TABLET BY MOUTH ONCE DAILY 12/29/20 12/29/21  Billie Ruddy, MD  loratadine (CLARITIN) 10 MG tablet Take 10 mg by mouth daily as needed.    [provider]  METAMUCIL FIBER PO Take by mouth in the morning and at bedtime.    [provider]  metFORMIN (GLUCOPHAGE) 1000 MG tablet Take 1 tablet by mouth daily with breakfast. 10/13/20 10/13/21  Shamleffer, Melanie Crazier, MD  Multiple Vitamin (MULTIVITAMIN WITH MINERALS) TABS tablet Take 1 tablet by mouth daily.    [provider]  potassium chloride SA (KLOR-CON) 20 MEQ tablet Take 1 tablet (20 mEq total) by mouth daily. 12/12/20   Billie Ruddy, MD  Semaglutide, 2 MG/DOSE, (OZEMPIC, 2 MG/DOSE,) 8 MG/3ML SOPN Inject 2 mg into the skin once a week. 10/24/20   Shamleffer, Melanie Crazier, MD  triamterene-hydrochlorothiazide (MAXZIDE-25) 37.5-25 MG tablet Take 1 tablet by mouth daily. 10/09/20 10/09/21  Billie Ruddy, MD  Vitamin D, Ergocalciferol, (DRISDOL) 1.25 MG (50000 UNIT) CAPS capsule Take 1 capsule (50,000 Units total) by mouth every 7 (seven) days. 12/12/20   Billie Ruddy, MD    Family History Family History  Problem Relation Age of Onset   Diabetes Mother    Congestive Heart Failure Mother     Social History Social History   Tobacco Use   Smoking  status: Never   Smokeless tobacco: Never  Vaping Use   Vaping Use: Never used  Substance Use Topics   Alcohol use: Yes    Comment: rarely   Drug use: No     Allergies   Trulicity [dulaglutide]   Review of Systems Review of Systems  Constitutional:  Positive for activity change and fatigue. Negative for appetite change and fever (Resolved).  HENT:  Negative for congestion, sinus pressure, sneezing and sore throat.   Respiratory:  Positive for cough and shortness of breath. Negative for chest tightness.   Cardiovascular:  Negative for chest pain.  Gastrointestinal:  Negative for abdominal pain, diarrhea, nausea and vomiting.  Musculoskeletal:  Negative for arthralgias (Improved) and myalgias (Improved).  Neurological:  Negative for dizziness, light-headedness and headaches.    Physical Exam Triage Vital Signs ED Triage Vitals  Enc Vitals Group     BP 04/05/21 1208 Marland Kitchen)  148/87     Pulse Rate 04/05/21 1211 (!) 110     Resp 04/05/21 1208 20     Temp 04/05/21 1211 98.7 F (37.1 C)     Temp Source 04/05/21 1211 Oral     SpO2 04/05/21 1211 92 %     Weight --      Height --      Head Circumference --      Peak Flow --      Pain Score 04/05/21 1205 0     Pain Loc --      Pain Edu? --      Excl. in [place]? --    No data found.  Updated Vital Signs BP 132/81    Pulse (!) 104    Temp 98.7 F (37.1 C) (Oral)    Resp 20    SpO2 95%   Visual Acuity Right Eye Distance:   Left Eye Distance:   Bilateral Distance:    Right Eye Near:   Left Eye Near:    Bilateral Near:     Physical Exam Vitals reviewed.  Constitutional:      General: She is awake. She is not in acute distress.    Appearance: Normal appearance. She is well-developed. She is not ill-appearing.     Comments: Very pleasant female appears stated age in no acute distress sitting comfortably in exam room  HENT:     Head: Normocephalic and atraumatic.     Right Ear: Tympanic membrane, ear canal and external ear  normal. Tympanic membrane is not erythematous or bulging.     Left Ear: Tympanic membrane, ear canal and external ear normal. Tympanic membrane is not erythematous or bulging.     Nose:     Right Sinus: No maxillary sinus tenderness or frontal sinus tenderness.     Left Sinus: No maxillary sinus tenderness or frontal sinus tenderness.     Mouth/Throat:     Pharynx: Uvula midline. Posterior oropharyngeal erythema present. No oropharyngeal exudate.  Cardiovascular:     Rate and Rhythm: Normal rate and regular rhythm.     Heart sounds: Normal heart sounds, S1 normal and S2 normal. No murmur heard. Pulmonary:     Effort: Pulmonary effort is normal.     Breath sounds: Examination of the right-lower field reveals rales. Rales present. No wheezing or rhonchi.  Psychiatric:        Behavior: Behavior is cooperative.     UC Treatments / Results  Labs (all labs ordered are listed, but only abnormal results are displayed) Labs Reviewed - No data to display  EKG   Radiology DG Chest 2 View  Result Date: 04/05/2021 CLINICAL DATA:  Shortness of breath and cough for 4 days. EXAM: CHEST - 2 VIEW COMPARISON:  May 04, 2011 FINDINGS: The heart size and mediastinal contours are within normal limits. Minimal atelectasis of lateral left base is noted. Both lungs are otherwise clear. The visualized skeletal structures are unremarkable. IMPRESSION: No active cardiopulmonary disease. Electronically Signed   By: Abelardo Diesel M.D.   On: 04/05/2021 12:38    Procedures Procedures (including critical care time)  Medications Ordered in UC Medications - No data to display  Initial Impression / Assessment and Plan / UC Course  I have reviewed the triage vital signs and the nursing notes.  Pertinent labs & imaging results that were available during my care of the patient were reviewed by me and considered in my medical decision making (see chart for details).  Chest x-ray obtained given oxygen  saturation of 92% (95% on recheck) and worsening cough showed no acute cardiopulmonary disease.  Given prolonged and worsening symptoms will cover with Augmentin twice daily for 7 days.  Patient was provided refill of albuterol to have on hand with instruction to use this every 4-6 hours as needed for shortness of breath and coughing fits.  She was given Promethazine DM up to 3 times a day for cough with instruction not to drive or drink alcohol with this medication as drowsiness is a common side effect.  She can use Mucinex, Flonase, Tylenol for symptom relief.  Recommend she rest and drink plenty of fluid.  Discussed alarm symptoms that warrant emergent evaluation including chest pain, shortness of breath, high fever, worsening cough, nausea/vomiting interfering with oral intake.  Discussed that if symptoms or not improving within a few days she should return here or see her PCP.  Strict return precautions given to which she expressed understanding.  Work excuse note provided.  Final Clinical Impressions(s) / UC Diagnoses   Final diagnoses:  Sinobronchitis  Acute cough  SOB (shortness of breath)     Discharge Instructions      Your chest x-ray was normal.  We are going to cover you for a sinobronchitis.  Take Augmentin twice daily for 7 days.  I have called in albuterol inhaler to have on hand and you can use this every 4-6 hours as needed for shortness of breath and coughing fits.  Use Promethazine DM up to 3 times a day as needed for cough.  This will make you sleepy so do not drive or drink alcohol while taking it.  You can use Mucinex, Flonase, Tylenol for symptom relief.  If anything worsens you develop worsening shortness of breath, severe cough, fever not responding to medication, nausea/vomiting interfering with oral intake, chest pain you need to go to the emergency room.  If symptoms are not improving within a few days please follow-up here or with your PCP.     ED Prescriptions      Medication Sig Dispense Auth. Provider   albuterol (VENTOLIN HFA) 108 (90 Base) MCG/ACT inhaler Inhale 1-2 puffs into the lungs every 6 (six) hours as needed for wheezing or shortness of breath. 8 g Nera Haworth K, PA-C   amoxicillin-clavulanate (AUGMENTIN) 875-125 MG tablet Take 1 tablet by mouth every 12 (twelve) hours. 14 tablet Demesha Boorman K, PA-C   promethazine-dextromethorphan (PROMETHAZINE-DM) 6.25-15 MG/5ML syrup Take 5 mLs by mouth 3 (three) times daily as needed for cough. 118 mL Tansy Lorek K, PA-C      PDMP not reviewed this encounter.   Terrilee Croak, PA-C 04/05/21 1310

## 2021-04-07 ENCOUNTER — Encounter: Payer: Self-pay | Admitting: Family Medicine

## 2021-04-10 ENCOUNTER — Other Ambulatory Visit: Payer: Self-pay

## 2021-04-10 ENCOUNTER — Other Ambulatory Visit (HOSPITAL_COMMUNITY): Payer: Self-pay

## 2021-04-10 MED ORDER — AZITHROMYCIN 250 MG PO TABS
ORAL_TABLET | ORAL | 3 refills | Status: AC
  Filled 2021-04-10: qty 6, 5d supply, fill #0

## 2021-04-17 ENCOUNTER — Other Ambulatory Visit (HOSPITAL_COMMUNITY): Payer: Self-pay

## 2021-05-01 ENCOUNTER — Other Ambulatory Visit (HOSPITAL_COMMUNITY): Payer: Self-pay

## 2021-05-14 ENCOUNTER — Other Ambulatory Visit: Payer: Self-pay | Admitting: Family Medicine

## 2021-05-15 ENCOUNTER — Other Ambulatory Visit (HOSPITAL_COMMUNITY): Payer: Self-pay

## 2021-05-15 MED ORDER — ATORVASTATIN CALCIUM 20 MG PO TABS
20.0000 mg | ORAL_TABLET | Freq: Every day | ORAL | 1 refills | Status: DC
  Filled 2021-05-15: qty 90, 90d supply, fill #0
  Filled 2022-02-09: qty 90, 90d supply, fill #1

## 2021-05-15 MED ORDER — AMLODIPINE BESYLATE 5 MG PO TABS
5.0000 mg | ORAL_TABLET | Freq: Every day | ORAL | 1 refills | Status: DC
  Filled 2021-05-15: qty 90, 90d supply, fill #0
  Filled 2021-08-12: qty 90, 90d supply, fill #1

## 2021-06-03 ENCOUNTER — Other Ambulatory Visit (HOSPITAL_COMMUNITY): Payer: Self-pay

## 2021-06-05 ENCOUNTER — Other Ambulatory Visit (HOSPITAL_COMMUNITY): Payer: Self-pay

## 2021-06-18 ENCOUNTER — Other Ambulatory Visit (HOSPITAL_COMMUNITY): Payer: Self-pay

## 2021-06-18 ENCOUNTER — Other Ambulatory Visit: Payer: Self-pay | Admitting: Family Medicine

## 2021-06-18 MED ORDER — TRIAMTERENE-HCTZ 37.5-25 MG PO TABS
1.0000 | ORAL_TABLET | Freq: Every day | ORAL | 1 refills | Status: DC
  Filled 2021-06-18 – 2021-06-26 (×2): qty 90, 90d supply, fill #0
  Filled 2021-10-16: qty 90, 90d supply, fill #1

## 2021-06-26 ENCOUNTER — Other Ambulatory Visit (HOSPITAL_COMMUNITY): Payer: Self-pay

## 2021-07-02 ENCOUNTER — Other Ambulatory Visit (HOSPITAL_COMMUNITY): Payer: Self-pay

## 2021-07-10 ENCOUNTER — Other Ambulatory Visit (HOSPITAL_COMMUNITY): Payer: Self-pay

## 2021-07-17 ENCOUNTER — Other Ambulatory Visit (HOSPITAL_COMMUNITY): Payer: Self-pay

## 2021-07-17 ENCOUNTER — Ambulatory Visit: Payer: 59 | Admitting: Internal Medicine

## 2021-07-17 ENCOUNTER — Encounter: Payer: Self-pay | Admitting: Internal Medicine

## 2021-07-17 VITALS — BP 130/90 | HR 101 | Ht 63.0 in | Wt 202.8 lb

## 2021-07-17 DIAGNOSIS — E1165 Type 2 diabetes mellitus with hyperglycemia: Secondary | ICD-10-CM | POA: Diagnosis not present

## 2021-07-17 LAB — POCT GLYCOSYLATED HEMOGLOBIN (HGB A1C): Hemoglobin A1C: 8.1 % — AB (ref 4.0–5.6)

## 2021-07-17 MED ORDER — METFORMIN HCL 1000 MG PO TABS
ORAL_TABLET | Freq: Every day | ORAL | 3 refills | Status: DC
  Filled 2021-07-17: qty 90, 90d supply, fill #0
  Filled 2021-10-16: qty 90, 90d supply, fill #1
  Filled 2022-01-28: qty 90, 90d supply, fill #2
  Filled 2022-01-30: qty 30, 30d supply, fill #2

## 2021-07-17 MED ORDER — TIRZEPATIDE 5 MG/0.5ML ~~LOC~~ SOAJ
5.0000 mg | SUBCUTANEOUS | 3 refills | Status: DC
  Filled 2021-07-17: qty 6, 84d supply, fill #0
  Filled 2021-10-12: qty 6, 84d supply, fill #1
  Filled 2022-01-02: qty 6, 84d supply, fill #2

## 2021-07-17 NOTE — Patient Instructions (Addendum)
-   Continue Glipizide 10 mg, 1 tablet before Breakfast and 1 tablet before Supper ?- Continue Metformin 1000 mg daily  ?- Continue  Farxiga 10  mg, 1 tablet every morning  ?- Stop Ozempic ?- Mounjaro 5 mg weekly  ? ? ? ?HOW TO TREAT LOW BLOOD SUGARS (Blood sugar LESS THAN 70 MG/DL) ?Please follow the RULE OF 15 for the treatment of hypoglycemia treatment (when your (blood sugars are less than 70 mg/dL)  ? ?STEP 1: Take 15 grams of carbohydrates when your blood sugar is low, which includes:  ?3-4 GLUCOSE TABS  OR ?3-4 OZ OF JUICE OR REGULAR SODA OR ?ONE TUBE OF GLUCOSE GEL   ? ?STEP 2: RECHECK blood sugar in 15 MINUTES ?STEP 3: If your blood sugar is still low at the 15 minute recheck --> then, go back to STEP 1 and treat AGAIN with another 15 grams of carbohydrates. ? ? ? ?

## 2021-07-17 NOTE — Progress Notes (Signed)
? ?  ? ? ? ?Name: Laura Marsh  ?Age/ Sex: 66 y.o., female   ?MRN/ DOB: [id number], 1955-08-25    ? ?PCP: Billie Ruddy, MD   ?Reason for Endocrinology Evaluation: Type 2 Diabetes Mellitus  ?   ?Initial Endocrinology Clinic Visit: 06/16/2018  ? ? ?PATIENT IDENTIFIER: Laura Marsh is a 66 y.o. female with a past medical history of HTN, T2DM and Dyslipidemia. The patient has followed with Endocrinology clinic since 06/16/2018 for consultative assistance with management of her diabetes. ? ?DIABETIC HISTORY:  ?Laura Marsh was diagnosed with T2DM ~ 2014. She has been on oral glycemic agents for years. She has never been on insulin therapy. Her hemoglobin A1c has ranged from 6.5% in 2014, peaking at 11.0% in 2019. ? ? ?On her initial visit to our clinic she was on Metformin, Glipizide and Januvia, with an A1c 9.7 %. ? ?We stopped Januvia and glipizide and started Ozempic with continuation of Metformin  ? ? ?[facility] ? ?I have attempted to screen her for Cushing's syndrome due to spontaneous hypokalemia and weight gain in 06/2020 but she did not return the urine sample. ?  ?SUBJECTIVE:  ? ?During the last visit (03/13/2021): A1c 8.3 % . We  continued Metformin, glipizide and  Ozempic .  We increased [place] ? ? ? ? ?Today (07/17/2021): Laura Marsh is here for a follow up on diabetes follow up.  She checks her blood sugars multiple times a day . The patient has not had hypoglycemic episodes since the last clinic visit.   ? ?She has been having recurrent yeats infections , uses monistat  ? ? ? ?HOME DIABETES REGIMEN:  ?Metformin 1000 mg Once daily ?Glipizide 10 mg , 1 tablet BID ?Ozempic 2 mg weekly ?Farxiga 10 mg daily  ? ? ? ? ?GLUCOSE LOG  ? ?95- '207mg'$ /dL  ? ? ?DIABETIC COMPLICATIONS: ?Microvascular complications: ?  ?Denies: CKD, retinopathy, neuropathy  ?Last eye exam: Completed 11/07/2020 ?  ?Macrovascular complications: ?Denies: CAD, PVD, CVA ? ? ? ?HISTORY:  ?Past Medical  History:  ?Past Medical History:  ?Diagnosis Date  ? Colon polyp   ? Diabetes mellitus   ? Elevated cholesterol   ? Hypertension   ? ?Past Surgical History:  ?Past Surgical History:  ?Procedure Laterality Date  ? ABDOMINAL HYSTERECTOMY    ? COLONOSCOPY    ? COLONOSCOPY WITH PROPOFOL N/A 11/08/2017  ? Procedure: COLONOSCOPY WITH PROPOFOL;  Surgeon: Toledo, Benay Pike, MD;  Location: [facility] ENDOSCOPY;  Service: Gastroenterology;  Laterality: N/A;  ? COLONOSCOPY WITH PROPOFOL N/A 04/28/2020  ? Procedure: COLONOSCOPY WITH PROPOFOL;  Surgeon: Benjamine Sprague, DO;  Location: [facility] ENDOSCOPY;  Service: General;  Laterality: N/A;  ? ?Social History:  reports that she has never smoked. She has never used smokeless tobacco. She reports current alcohol use. She reports that she does not use drugs. ?Family History:  ?Family History  ?Problem Relation Age of Onset  ? Diabetes Mother   ? Congestive Heart Failure Mother   ? ? ? ?HOME MEDICATIONS: ?Allergies as of 07/17/2021   ? ?   Reactions  ? Trulicity [dulaglutide] Itching  ? Rash/itching at injection site  ? ?  ? ?  ?Medication List  ?  ? ?  ? Accurate as of July 17, 2021  6:41 PM. If you have any questions, ask your nurse or doctor.  ?  ?  ? ?  ? ?STOP taking these medications   ? ?Ozempic (  2 MG/DOSE) 8 MG/3ML Sopn ?Generic drug: Semaglutide (2 MG/DOSE) ?Stopped by: Dorita Sciara, MD ?  ? ?  ? ?TAKE these medications   ? ?acetaminophen 500 MG tablet ?Commonly known as: TYLENOL ?Take 500 mg by mouth as needed. ?  ?albuterol 108 (90 Base) MCG/ACT inhaler ?Commonly known as: VENTOLIN HFA ?Inhale 1-2 puffs into the lungs every 6 (six) hours as needed for wheezing or shortness of breath. ?  ?Alcohol Swabs Pads ?Use as directed. ?  ?amLODipine 5 MG tablet ?Commonly known as: NORVASC ?Take 1 tablet  by mouth daily. ?  ?amoxicillin-clavulanate 875-125 MG tablet ?Commonly known as: AUGMENTIN ?Take 1 tablet by mouth every 12 (twelve) hours. ?  ?aspirin 81 MG EC tablet ?Take 81 mg by mouth  daily. Swallow whole. ?Taken it twice a week ?  ?atorvastatin 20 MG tablet ?Commonly known as: LIPITOR ?Take 1 tablet  by mouth daily. ?  ?benzonatate 100 MG capsule ?Commonly known as: [place] ?Take 1 to 2 capsules up to twice daily as needed for cough ?  ?Dexcom G6 Sensor Misc ?1 Device by Does not apply route as directed. ?  ?Dexcom G6 Sensor Misc ?Use as directed every 10 days ?(Use as directed every 10 days) ?  ?Dexcom G6 Transmitter Misc ?1 Device by Does not apply route as directed. ?  ?Dexcom G6 Transmitter Misc ?Use as directed ?(Use as directed) ?  ?Farxiga 10 MG Tabs tablet ?Generic drug: dapagliflozin propanediol ?Take 1 tablet by mouth daily before breakfast. ?  ?fluticasone 50 MCG/ACT nasal spray ?Commonly known as: FLONASE ?Place 2 sprays into both nostrils daily. ?  ?FREESTYLE LITE test strip ?Generic drug: glucose blood ?Use to test blood sugar 2 (two) times daily. ?  ?glipiZIDE 10 MG tablet ?Commonly known as: GLUCOTROL ?Take 1 tablet by mouth 2 (two) times daily before a meal. ?  ?lisinopril 30 MG tablet ?Commonly known as: ZESTRIL ?TAKE 1 TABLET BY MOUTH ONCE DAILY ?  ?loratadine 10 MG tablet ?Commonly known as: CLARITIN ?Take 10 mg by mouth daily as needed. ?  ?METAMUCIL FIBER PO ?Take by mouth in the morning and at bedtime. ?  ?metFORMIN 1000 MG tablet ?Commonly known as: GLUCOPHAGE ?Take 1 tablet by mouth daily with breakfast. ?  ?Mounjaro 5 MG/0.5ML Pen ?Generic drug: tirzepatide ?Inject 5 mg into the skin once a week. ?Started by: Dorita Sciara, MD ?  ?multivitamin with minerals Tabs tablet ?Take 1 tablet by mouth daily. ?  ?potassium chloride SA 20 MEQ tablet ?Commonly known as: KLOR-CON M ?Take 1 tablet (20 mEq total) by mouth daily. ?  ?promethazine-dextromethorphan 6.25-15 MG/5ML syrup ?Commonly known as: PROMETHAZINE-DM ?Take 5 mLs by mouth 3 (three) times daily as needed for cough. ?  ?triamterene-hydrochlorothiazide 37.5-25 MG tablet ?Commonly known as: MAXZIDE-25 ?Take  1 tablet by mouth daily. ?  ?Vitamin D (Ergocalciferol) 1.25 MG (50000 UNIT) Caps capsule ?Commonly known as: DRISDOL ?Take 1 capsule (50,000 Units total) by mouth every 7 (seven) days. ?  ? ?  ?PHYSICAL EXAM: ?VS: BP 130/90 (BP Location: Left Arm, Patient Position: Sitting, Cuff Size: Normal)   Pulse (!) 101   Ht '5\' 3"'$  (1.6 m)   Wt 202 lb 12.8 oz (92 kg)   SpO2 96%   BMI 35.92 kg/m?   ? ?EXAM: ?General: Pt appears well and is in NAD  ?Lungs: Clear with good BS bilat with no rales, rhonchi, or wheezes  ?Heart: Auscultation: RRR  ?Periph. circulation: non pitting edema  ?Mental Status: Judgment,  insight: intact ?Orientation: oriented to time, place, and person ?Mood and affect: no depression, anxiety, or agitation  ? ? ?DM Foot exam 06/20/2020 ? ?The skin of the feet is intact without sores or ulcerations. ?The pedal pulses are 2+ on right and 2+ on left. ?The sensation is intact to a screening 5.07, 10 gram monofilament bilaterally ? ? ? ?DATA REVIEWED: ? ?Lab Results  ?Component Value Date  ? HGBA1C 8.1 (A) 07/17/2021  ? HGBA1C 8.3 (A) 03/13/2021  ? HGBA1C 9.1 (A) 10/24/2020  ? ? ? ? ?Lab Results  ?Component Value Date  ? CHOL 172 12/12/2020  ? HDL 50.90 12/12/2020  ? LDLCALC 101 (H) 12/12/2020  ? LDLDIRECT 140.3 02/19/2013  ? TRIG 100.0 12/12/2020  ? CHOLHDL 3 12/12/2020  ?     ? Latest Reference Range & Units 03/13/21 16:10  ?Sodium 135 - 146 mmol/L 141  ?Potassium 3.5 - 5.3 mmol/L 3.8  ?Chloride 98 - 110 mmol/L 103  ?CO2 20 - 32 mmol/L 25  ?Glucose 65 - 99 mg/dL 143 (H)  ?BUN 7 - 25 mg/dL 16  ?Creatinine 0.50 - 1.05 mg/dL 0.89  ?Calcium 8.6 - 10.4 mg/dL 10.4  ?BUN/Creatinine Ratio 6 - 22 (calc) NOT APPLICABLE  ? ? ?ASSESSMENT / PLAN / RECOMMENDATIONS:  ? ?1) Type 2 Diabetes Mellitus, Poorly controlled, Without complications - Most recent A1c of 8.1 %. Goal A1c < 7.0 %.   ?  ?-A1c continues to be above goal ?-In manually reviewing her glucose meter, she has been noted with optimal BG's but unfortunately these  are not daily and not over the past 3 months.  We did discuss the importance of following a healthy lifestyle choices long-term for that to reflect on her A1c ?-I also explained to the patient that we are run

## 2021-07-25 ENCOUNTER — Other Ambulatory Visit (HOSPITAL_COMMUNITY): Payer: Self-pay

## 2021-08-03 ENCOUNTER — Other Ambulatory Visit (HOSPITAL_COMMUNITY): Payer: Self-pay

## 2021-08-12 ENCOUNTER — Other Ambulatory Visit (HOSPITAL_COMMUNITY): Payer: Self-pay

## 2021-08-14 ENCOUNTER — Other Ambulatory Visit (HOSPITAL_COMMUNITY): Payer: Self-pay

## 2021-08-14 ENCOUNTER — Other Ambulatory Visit: Payer: Self-pay | Admitting: Internal Medicine

## 2021-08-14 MED ORDER — DEXCOM G6 SENSOR MISC
11 refills | Status: DC
  Filled 2021-08-14 – 2022-04-24 (×2): qty 3, 30d supply, fill #0

## 2021-08-14 MED ORDER — DEXCOM G6 TRANSMITTER MISC
3 refills | Status: DC
  Filled 2021-08-14: qty 1, 90d supply, fill #0
  Filled 2022-04-24: qty 1, 90d supply, fill #1

## 2021-08-17 ENCOUNTER — Other Ambulatory Visit (HOSPITAL_COMMUNITY): Payer: Self-pay

## 2021-08-18 ENCOUNTER — Other Ambulatory Visit (HOSPITAL_COMMUNITY): Payer: Self-pay

## 2021-08-18 ENCOUNTER — Telehealth: Payer: Self-pay | Admitting: Pharmacy Technician

## 2021-08-18 NOTE — Telephone Encounter (Signed)
Patient Advocate Encounter ?  ?Received notification from CoverMyMeds that prior authorization for Dexcom G6 is required by his/her insurance [employer]. ?  ?PA submitted on 08/18/21 ?Key [id number] ?Status is pending ?   ?[facility] will continue to follow: ? ?Patient Advocate ?Fax:  [phone] ? ?

## 2021-08-20 ENCOUNTER — Other Ambulatory Visit (HOSPITAL_COMMUNITY): Payer: Self-pay

## 2021-08-20 NOTE — Telephone Encounter (Signed)
Patient Advocate Encounter  Prior Authorization for Dexcom G6 Sensor has been approved.    PA# [id number] Effective dates: 08/18/2021 through 08/18/2022  Patients co-pay is $66.18.

## 2021-08-28 ENCOUNTER — Other Ambulatory Visit (HOSPITAL_COMMUNITY): Payer: Self-pay

## 2021-09-02 ENCOUNTER — Other Ambulatory Visit (HOSPITAL_COMMUNITY): Payer: Self-pay

## 2021-09-02 ENCOUNTER — Other Ambulatory Visit: Payer: Self-pay | Admitting: Internal Medicine

## 2021-09-03 ENCOUNTER — Other Ambulatory Visit (HOSPITAL_COMMUNITY): Payer: Self-pay

## 2021-09-03 MED ORDER — DAPAGLIFLOZIN PROPANEDIOL 10 MG PO TABS
10.0000 mg | ORAL_TABLET | Freq: Every day | ORAL | 1 refills | Status: DC
  Filled 2021-09-03: qty 90, 90d supply, fill #0
  Filled 2021-11-30: qty 90, 90d supply, fill #1

## 2021-09-23 ENCOUNTER — Other Ambulatory Visit (HOSPITAL_COMMUNITY): Payer: Self-pay

## 2021-10-12 ENCOUNTER — Other Ambulatory Visit (HOSPITAL_COMMUNITY): Payer: Self-pay

## 2021-10-16 ENCOUNTER — Other Ambulatory Visit (HOSPITAL_COMMUNITY): Payer: Self-pay

## 2021-10-30 ENCOUNTER — Other Ambulatory Visit: Payer: Self-pay | Admitting: Internal Medicine

## 2021-10-30 ENCOUNTER — Other Ambulatory Visit (HOSPITAL_COMMUNITY): Payer: Self-pay

## 2021-10-30 DIAGNOSIS — E1165 Type 2 diabetes mellitus with hyperglycemia: Secondary | ICD-10-CM

## 2021-10-30 MED ORDER — GLIPIZIDE 10 MG PO TABS
10.0000 mg | ORAL_TABLET | Freq: Two times a day (BID) | ORAL | 3 refills | Status: DC
  Filled 2021-10-30: qty 180, 90d supply, fill #0
  Filled 2022-01-28: qty 180, 90d supply, fill #1
  Filled 2022-01-30: qty 60, 30d supply, fill #1

## 2021-11-11 ENCOUNTER — Other Ambulatory Visit: Payer: Self-pay | Admitting: Family Medicine

## 2021-11-11 ENCOUNTER — Other Ambulatory Visit (HOSPITAL_COMMUNITY): Payer: Self-pay

## 2021-11-12 ENCOUNTER — Other Ambulatory Visit (HOSPITAL_COMMUNITY): Payer: Self-pay

## 2021-11-12 MED ORDER — AMLODIPINE BESYLATE 5 MG PO TABS
5.0000 mg | ORAL_TABLET | Freq: Every day | ORAL | 1 refills | Status: DC
  Filled 2021-11-12: qty 90, 90d supply, fill #0
  Filled 2022-02-09: qty 90, 90d supply, fill #1

## 2021-11-27 DIAGNOSIS — Z1231 Encounter for screening mammogram for malignant neoplasm of breast: Secondary | ICD-10-CM | POA: Diagnosis not present

## 2021-11-27 DIAGNOSIS — Z6835 Body mass index (BMI) 35.0-35.9, adult: Secondary | ICD-10-CM | POA: Diagnosis not present

## 2021-11-27 DIAGNOSIS — Z01419 Encounter for gynecological examination (general) (routine) without abnormal findings: Secondary | ICD-10-CM | POA: Diagnosis not present

## 2021-11-27 LAB — HM MAMMOGRAPHY

## 2021-11-30 ENCOUNTER — Other Ambulatory Visit (HOSPITAL_COMMUNITY): Payer: Self-pay

## 2021-12-02 ENCOUNTER — Encounter: Payer: Self-pay | Admitting: Family Medicine

## 2021-12-11 ENCOUNTER — Other Ambulatory Visit (HOSPITAL_COMMUNITY): Payer: Self-pay

## 2021-12-15 DIAGNOSIS — H524 Presbyopia: Secondary | ICD-10-CM | POA: Diagnosis not present

## 2021-12-15 DIAGNOSIS — H52223 Regular astigmatism, bilateral: Secondary | ICD-10-CM | POA: Diagnosis not present

## 2021-12-15 DIAGNOSIS — D3132 Benign neoplasm of left choroid: Secondary | ICD-10-CM | POA: Diagnosis not present

## 2021-12-15 DIAGNOSIS — H04123 Dry eye syndrome of bilateral lacrimal glands: Secondary | ICD-10-CM | POA: Diagnosis not present

## 2021-12-15 DIAGNOSIS — E119 Type 2 diabetes mellitus without complications: Secondary | ICD-10-CM | POA: Diagnosis not present

## 2021-12-15 DIAGNOSIS — H2513 Age-related nuclear cataract, bilateral: Secondary | ICD-10-CM | POA: Diagnosis not present

## 2021-12-16 ENCOUNTER — Other Ambulatory Visit: Payer: Self-pay | Admitting: Family Medicine

## 2021-12-16 ENCOUNTER — Other Ambulatory Visit (HOSPITAL_COMMUNITY): Payer: Self-pay

## 2021-12-16 DIAGNOSIS — I10 Essential (primary) hypertension: Secondary | ICD-10-CM

## 2021-12-16 MED ORDER — LISINOPRIL 30 MG PO TABS
ORAL_TABLET | Freq: Every day | ORAL | 0 refills | Status: DC
  Filled 2021-12-16: qty 90, 90d supply, fill #0

## 2021-12-18 ENCOUNTER — Other Ambulatory Visit (HOSPITAL_COMMUNITY): Payer: Self-pay

## 2021-12-18 ENCOUNTER — Encounter: Payer: Self-pay | Admitting: Family Medicine

## 2021-12-18 ENCOUNTER — Ambulatory Visit (INDEPENDENT_AMBULATORY_CARE_PROVIDER_SITE_OTHER): Payer: 59 | Admitting: Family Medicine

## 2021-12-18 VITALS — BP 124/88 | HR 101 | Temp 97.6°F | Ht 64.25 in | Wt 203.0 lb

## 2021-12-18 DIAGNOSIS — Z0000 Encounter for general adult medical examination without abnormal findings: Secondary | ICD-10-CM | POA: Diagnosis not present

## 2021-12-18 DIAGNOSIS — E1169 Type 2 diabetes mellitus with other specified complication: Secondary | ICD-10-CM | POA: Diagnosis not present

## 2021-12-18 DIAGNOSIS — Z23 Encounter for immunization: Secondary | ICD-10-CM

## 2021-12-18 DIAGNOSIS — Z6835 Body mass index (BMI) 35.0-35.9, adult: Secondary | ICD-10-CM

## 2021-12-18 DIAGNOSIS — E7841 Elevated Lipoprotein(a): Secondary | ICD-10-CM | POA: Diagnosis not present

## 2021-12-18 DIAGNOSIS — J302 Other seasonal allergic rhinitis: Secondary | ICD-10-CM

## 2021-12-18 DIAGNOSIS — I10 Essential (primary) hypertension: Secondary | ICD-10-CM

## 2021-12-18 LAB — BASIC METABOLIC PANEL
BUN: 15 mg/dL (ref 6–23)
CO2: 28 mEq/L (ref 19–32)
Calcium: 10.1 mg/dL (ref 8.4–10.5)
Chloride: 103 mEq/L (ref 96–112)
Creatinine, Ser: 0.85 mg/dL (ref 0.40–1.20)
GFR: 72.62 mL/min (ref >60.00)
Glucose, Bld: 181 mg/dL — ABNORMAL HIGH (ref 70–99)
Potassium: 3.6 mEq/L (ref 3.5–5.1)
Sodium: 141 mEq/L (ref 135–145)

## 2021-12-18 LAB — T4, FREE: Free T4: 0.93 ng/dL (ref 0.60–1.60)

## 2021-12-18 LAB — CBC WITH DIFFERENTIAL/PLATELET
Basophils Absolute: 0.1 10*3/uL (ref 0.0–0.1)
Basophils Relative: 0.9 % (ref 0.0–3.0)
Eosinophils Absolute: 0.3 10*3/uL (ref 0.0–0.7)
Eosinophils Relative: 3.6 % (ref 0.0–5.0)
HCT: 39.3 % (ref 36.0–46.0)
Hemoglobin: 12.5 g/dL (ref 12.0–15.0)
Lymphocytes Relative: 33.5 % (ref 12.0–46.0)
Lymphs Abs: 2.6 10*3/uL (ref 0.7–4.0)
MCHC: 31.8 g/dL (ref 30.0–36.0)
MCV: 85.7 fl (ref 78.0–100.0)
Monocytes Absolute: 0.4 10*3/uL (ref 0.1–1.0)
Monocytes Relative: 4.6 % (ref 3.0–12.0)
Neutro Abs: 4.4 10*3/uL (ref 1.4–7.7)
Neutrophils Relative %: 57.4 % (ref 43.0–77.0)
Platelets: 321 10*3/uL (ref 150.0–400.0)
RBC: 4.58 Mil/uL (ref 3.87–5.11)
RDW: 13.3 % (ref 11.5–15.5)
WBC: 7.6 10*3/uL (ref 4.0–10.5)

## 2021-12-18 LAB — VITAMIN D 25 HYDROXY (VIT D DEFICIENCY, FRACTURES): VITD: 32.5 ng/mL (ref 30.00–100.00)

## 2021-12-18 LAB — TSH: TSH: 1.94 u[IU]/mL (ref 0.35–5.50)

## 2021-12-18 LAB — HEMOGLOBIN A1C: Hgb A1c MFr Bld: 8.5 % — ABNORMAL HIGH (ref 4.6–6.5)

## 2021-12-18 LAB — VITAMIN B12: Vitamin B-12: 686 pg/mL (ref 211–911)

## 2021-12-18 LAB — LIPID PANEL
Cholesterol: 164 mg/dL (ref 0–200)
HDL: 54.3 mg/dL (ref >39.00)
LDL Cholesterol: 95 mg/dL (ref 0–99)
NonHDL: 109.98
Total CHOL/HDL Ratio: 3
Triglycerides: 75 mg/dL (ref 0.0–149.0)
VLDL: 15 mg/dL (ref 0.0–40.0)

## 2021-12-18 MED ORDER — LISINOPRIL 30 MG PO TABS
ORAL_TABLET | Freq: Every day | ORAL | 3 refills | Status: DC
  Filled 2021-12-18: qty 90, 90d supply, fill #0
  Filled 2022-03-19: qty 90, 90d supply, fill #1
  Filled 2022-06-24: qty 90, 90d supply, fill #2
  Filled 2022-08-09 – 2022-09-20 (×2): qty 90, 90d supply, fill #3

## 2021-12-18 NOTE — Progress Notes (Signed)
Subjective:     Laura Marsh is a 66 y.o. female and is here for a comprehensive physical exam. The patient reports doing well.  Followed by Endo prediabetes.  States blood sugar typically 92-155 since being on Mounjaro.  Patient endorses having a recent foot exam at endocrinology visit.  Patient states blood pressure has been good, typically lower like 102/70.  Patient thinks she needs refill on lisinopril.  Patient trying to walk during lunch.  Patient has not taken her fluid pill in the last few days.  Has noticed increase in weight from OB/GYN appointment earlier this week to today's visit.  Patient also had eye exam last week which is stable.  Still watching a mole in left eye.  Mild cataracts noted in both eyes.  Patient taking Claritin and Flonase daily for allergy symptoms.  Social History   Socioeconomic History   Marital status: Divorced    Spouse name: Not on file   Number of children: Not on file   Years of education: Not on file   Highest education level: Not on file  Occupational History   Not on file  Tobacco Use   Smoking status: Never   Smokeless tobacco: Never  Vaping Use   Vaping Use: Never used  Substance and Sexual Activity   Alcohol use: Yes    Comment: rarely   Drug use: No   Sexual activity: Not on file  Other Topics Concern   Not on file  Social History Narrative   Right handed    Lives with husband   Social Determinants of Health   Financial Resource Strain: Not on file  Food Insecurity: Not on file  Transportation Needs: Not on file  Physical Activity: Not on file  Stress: Not on file  Social Connections: Not on file  Intimate Partner Violence: Not on file   Health Maintenance  Topic Date Due   Zoster Vaccines- Shingrix (1 of 2) Never done   PAP SMEAR-Modifier  05/06/2020   Diabetic kidney evaluation - Urine ACR  05/08/2020   FOOT EXAM  05/08/2020   COVID-19 Vaccine (4 - Pfizer series) 07/20/2020   TETANUS/TDAP  09/03/2020   COLONOSCOPY  (Pts 45-51yr Insurance coverage will need to be confirmed)  04/28/2021   INFLUENZA VACCINE  11/03/2021   HIV Screening  06/13/2024 (Originally 12/17/1970)   HEMOGLOBIN A1C  01/16/2022   OPHTHALMOLOGY EXAM  02/03/2022   Diabetic kidney evaluation - GFR measurement  03/13/2022   MAMMOGRAM  11/28/2023   Hepatitis C Screening  Completed   HPV VACCINES  Aged Out    The following portions of the patient's history were reviewed and updated as appropriate: allergies, current medications, past family history, past medical history, past social history, past surgical history, and problem list.  Review of Systems Pertinent items noted in HPI and remainder of comprehensive ROS otherwise negative.   Objective:    BP 124/88 (BP Location: Left Arm, Patient Position: Sitting, Cuff Size: Large)   Pulse (!) 101   Temp 97.6 F (36.4 C) (Oral)   Ht 5' 4.25" (1.632 m)   Wt 203 lb (92.1 kg)   SpO2 94%   BMI 34.57 kg/m  General appearance: alert, cooperative, and no distress Head: Normocephalic, without obvious abnormality, atraumatic Eyes: conjunctivae/corneas clear. PERRL, EOM's intact. Fundi benign. Ears: normal TM's and external ear canals both ears Nose: Nares normal. Septum midline. Mucosa normal. No drainage or sinus tenderness.  Edema left naris greater than right. Throat: lips, mucosa, and tongue  normal; teeth and gums normal Neck: no adenopathy, no carotid bruit, no JVD, supple, symmetrical, trachea midline, and thyroid not enlarged, symmetric, no tenderness/mass/nodules Lungs: clear to auscultation bilaterally Heart: regular rate and rhythm, S1, S2 normal, no murmur, click, rub or gallop Abdomen: soft, non-tender; bowel sounds normal; no masses,  no organomegaly Extremities: extremities normal, atraumatic, no cyanosis or edema Pulses: 2+ and symmetric Skin: Skin color, texture, turgor normal. No rashes or lesions Lymph nodes: Cervical, supraclavicular, and axillary nodes  normal. Neurologic: Alert and oriented X 3, normal strength and tone. Normal symmetric reflexes. Normal coordination and gait    Assessment:    Healthy female exam.      Plan:    Anticipatory guidance given including wearing seatbelts, smoke detectors in the home, increasing physical activity, increasing p.o. intake of water and vegetables. -labs -Followed by OB/GYN for Pap -Colonoscopy done 04/24/2020 -Mammogram done 11/27/2021 -Immunizations reviewed.  Exam this visit -Given handout -Next CPE in 1 year See After Visit Summary for Counseling Recommendations   - Plan: CBC with Differential/Platelet  Essential hypertension -Controlled -Continue current medications including Norvasc 5 mg daily, triamterene-hydrochlorothiazide 37.5-25 mg, lisinopril 30 mg daily -Continue lifestyle modifications  - Plan: lisinopril (ZESTRIL) 30 MG tablet, Basic metabolic panel, TSH, T4, Free  Need for influenza vaccination - Plan: Flu Vaccine QUAD 6+ mos PF IM (Fluarix Quad PF)  Elevated lipoprotein(a) -LDL 1017/12/25 -Lifestyle modifications - Plan: Lipid panel  Type 2 diabetes mellitus with other specified complication, without long-term current use of insulin (HCC) -Hemoglobin A1c 8.1% on 07/17/2021 -Continue Mounjaro 5 mg weekly, Farxiga 10 mg daily, glipizide 10 mg twice daily, metformin 1000 mg twice daily -Continue follow-up with endocrinology -Foot exam done with endocrinology -Continue ACE I and statin. - Plan: Hemoglobin A1c, Lipid panel, Vitamin B12  Class 2 severe obesity due to excess calories with serious comorbidity and body mass index (BMI) of 35.0 to 35.9 in adult (HCC) -Pt 5 3 weight 203 pounds.  BMI 35.93 -Continue lifestyle modifications -Continue Mounjaro - Plan: Vitamin D, 25-hydroxy  Seasonal allergies -Continue Claritin and Flonase -For increased symptoms consider switching to a different OTC antihistamine -Consider using local honey  Follow-up as needed  Grier Mitts, MD

## 2021-12-25 DIAGNOSIS — N9089 Other specified noninflammatory disorders of vulva and perineum: Secondary | ICD-10-CM | POA: Diagnosis not present

## 2021-12-25 DIAGNOSIS — N898 Other specified noninflammatory disorders of vagina: Secondary | ICD-10-CM | POA: Diagnosis not present

## 2022-01-02 ENCOUNTER — Other Ambulatory Visit (HOSPITAL_COMMUNITY): Payer: Self-pay

## 2022-01-09 IMAGING — CT CT HEAD W/O CM
4 series · 16 of 47 positions shown, 18 images · non-contrast
Comparison: None.

CLINICAL DATA: Facial droop, headache.

EXAM:
CT HEAD WITHOUT CONTRAST
TECHNIQUE: Contiguous axial images were obtained from the base of the skull
through the vertex without intravenous contrast.

[Series 3: head wo · axial · 0.43mm/px · z∈[-109,-4]mm · 7 of 29 slices shown, 9 images]
[im 4/29  brain]
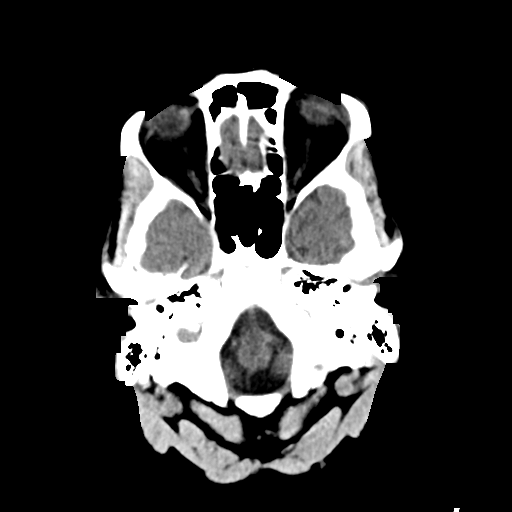
[im 4/29  bone]
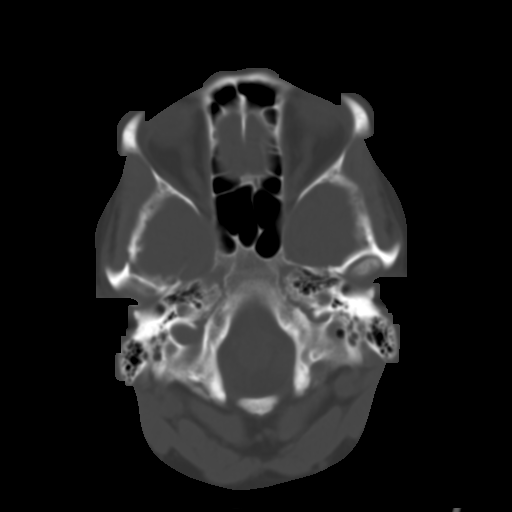
[im 8/29  brain]
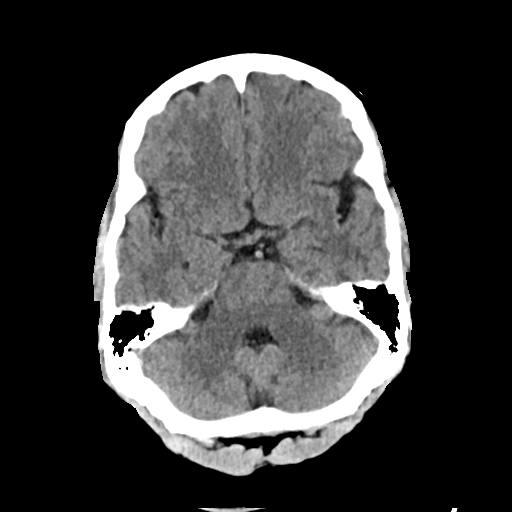
[im 11/29  brain]
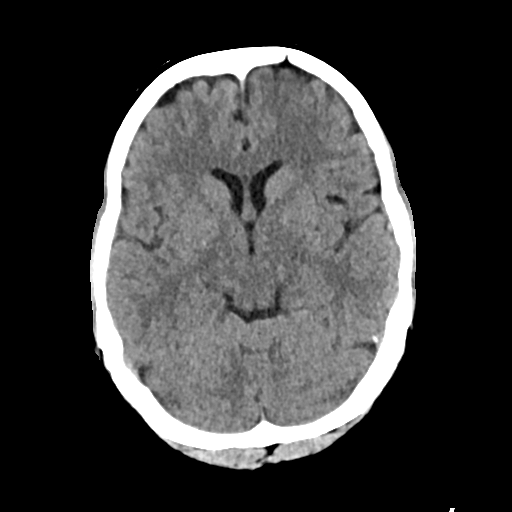
[im 15/29  brain]
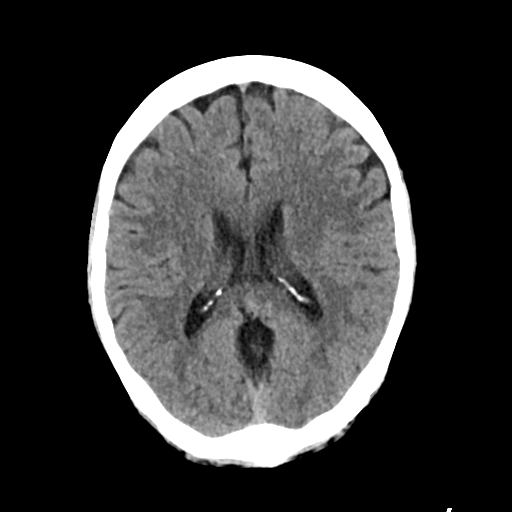
[im 18/29  brain]
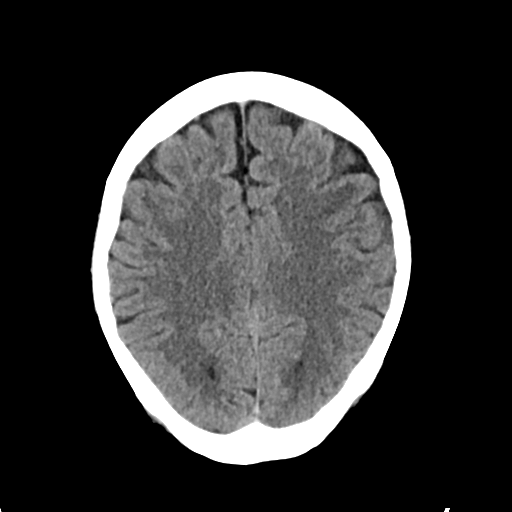
[im 18/29  bone]
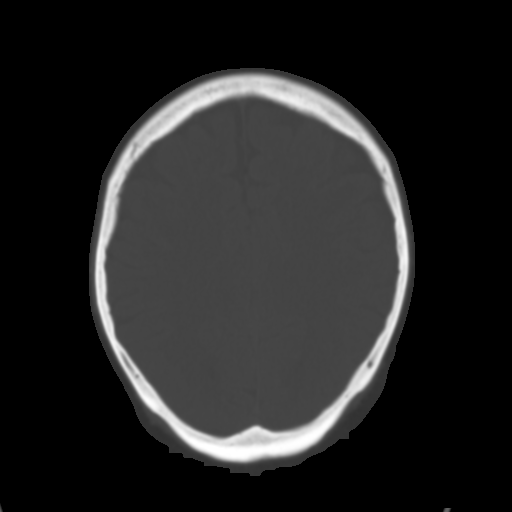
[im 22/29  brain]
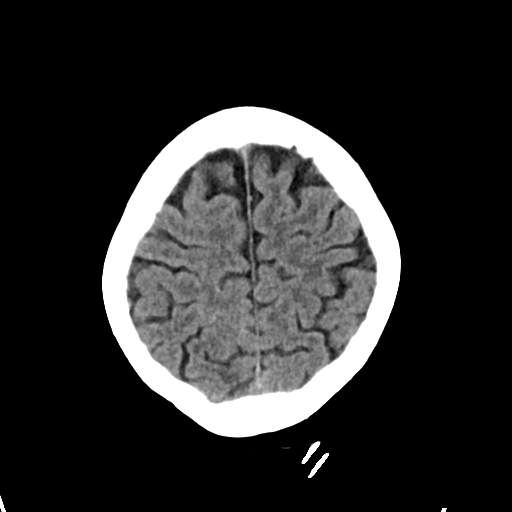
[im 25/29  brain]
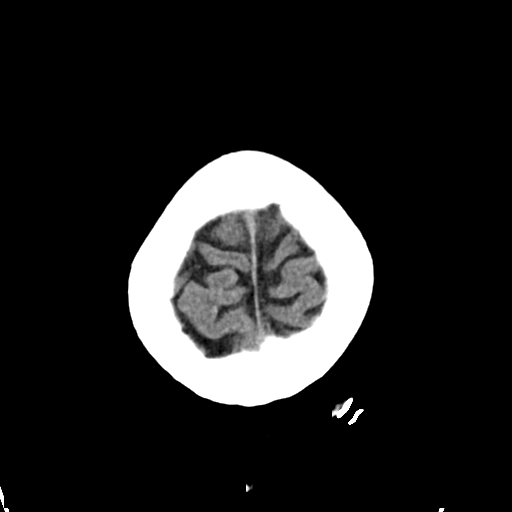

[Series 4: head bone · axial · 0.43mm/px · z∈[-110,-82]mm · 3 of 72 slices shown]
[im 8/72  bone]
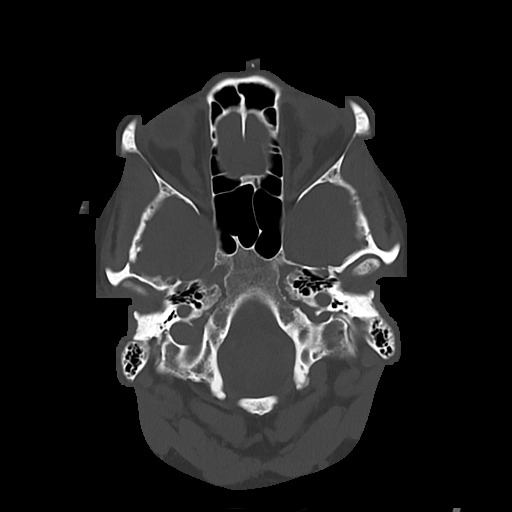
[im 15/72  bone]
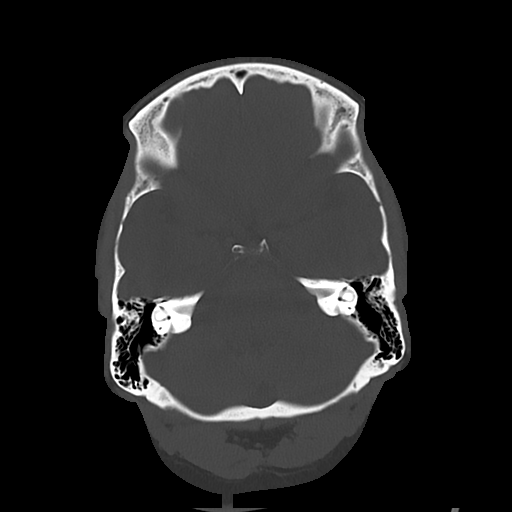
[im 22/72  bone]
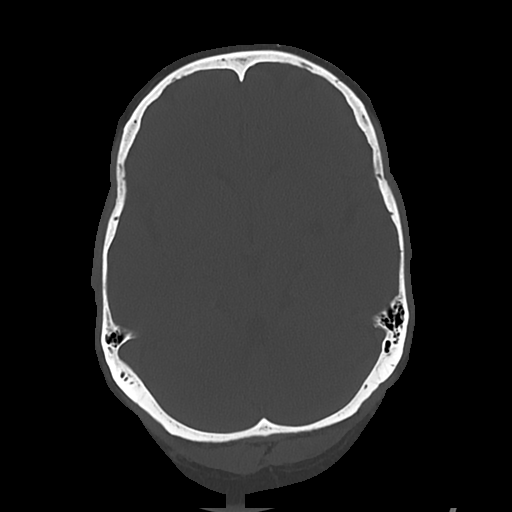

[Series 5: cor soft · coronal · 0.32mm/px · 3 of 66 slices shown]
[im 22/66  brain]
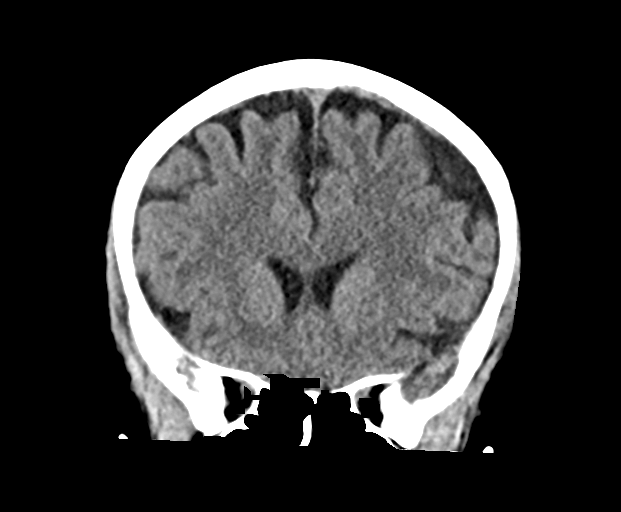
[im 29/66  brain]
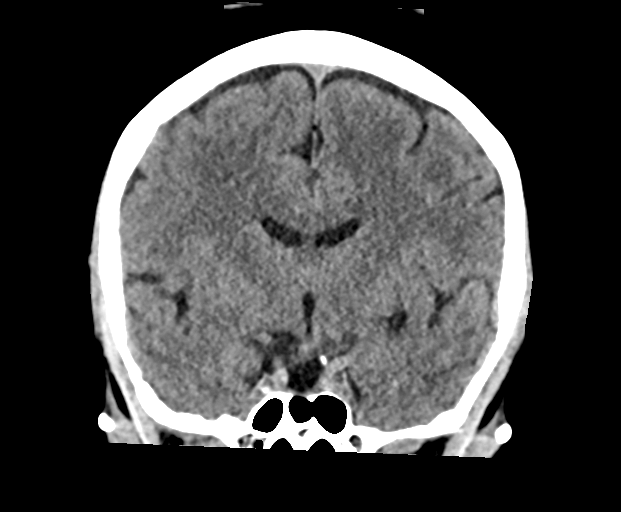
[im 37/66  brain]
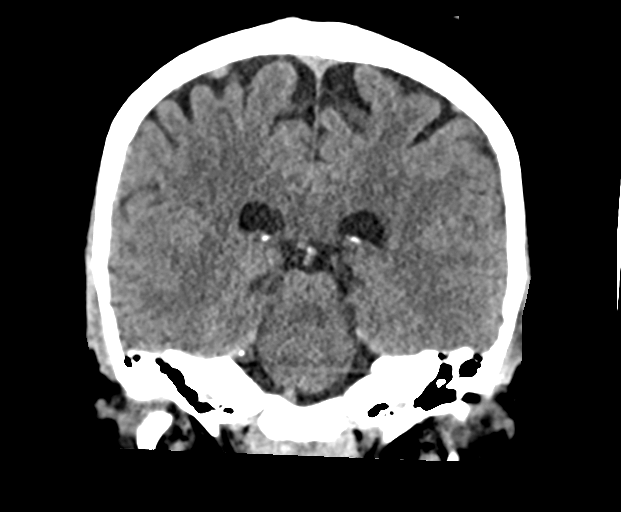

[Series 6: sag soft · sagittal · 0.32mm/px · 3 of 55 slices shown]
[im 19/55  brain]
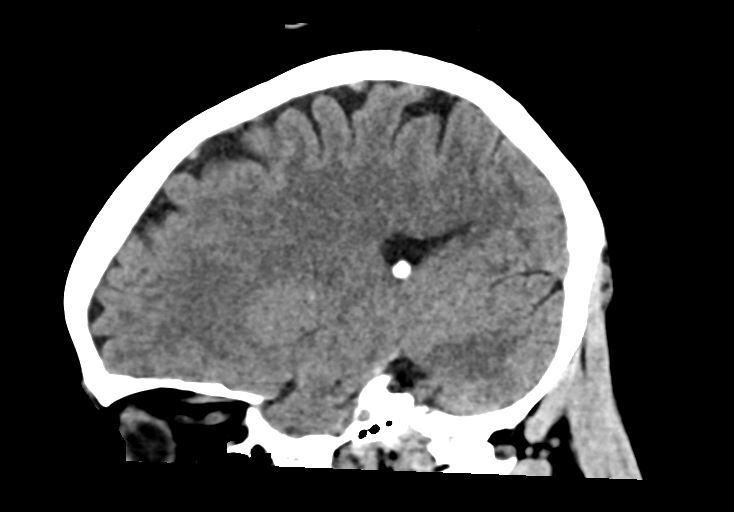
[im 28/55  brain]
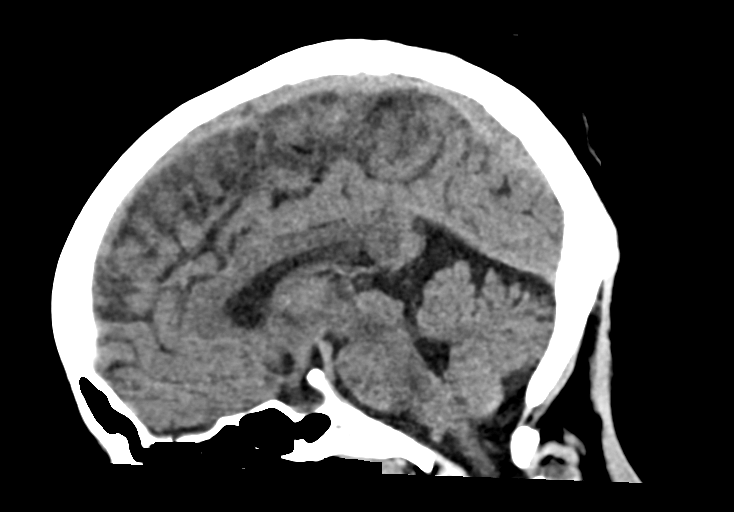
[im 37/55  brain]
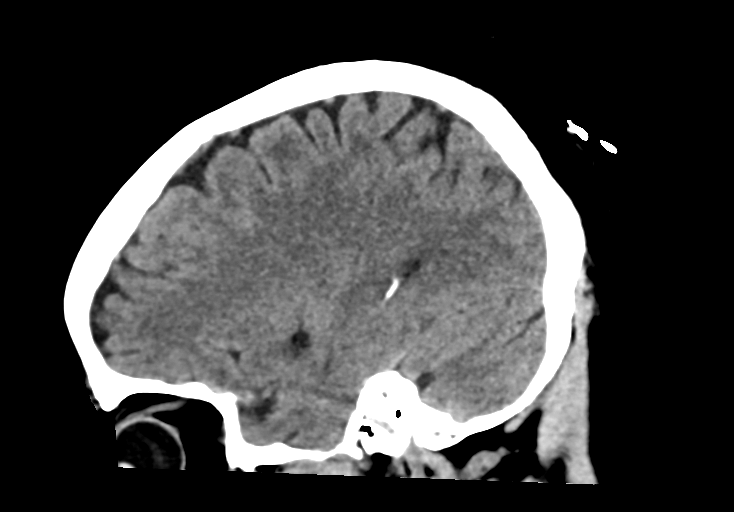

[16 of 47 positions shown; findings below may reference images not displayed]

FINDINGS: Brain: No evidence of acute infarction, hemorrhage, hydrocephalus,
extra-axial collection or mass lesion/mass effect.

Vascular: No hyperdense vessel or unexpected calcification.

Skull: Normal. Negative for fracture or focal lesion.

Sinuses/Orbits: No acute finding.

Other: None.
IMPRESSION: Normal head CT.

## 2022-01-29 ENCOUNTER — Other Ambulatory Visit (HOSPITAL_COMMUNITY): Payer: Self-pay

## 2022-01-30 ENCOUNTER — Other Ambulatory Visit (HOSPITAL_COMMUNITY): Payer: Self-pay

## 2022-02-01 ENCOUNTER — Other Ambulatory Visit (HOSPITAL_COMMUNITY): Payer: Self-pay

## 2022-02-01 ENCOUNTER — Ambulatory Visit (INDEPENDENT_AMBULATORY_CARE_PROVIDER_SITE_OTHER): Payer: 59 | Admitting: Internal Medicine

## 2022-02-01 ENCOUNTER — Encounter: Payer: Self-pay | Admitting: Internal Medicine

## 2022-02-01 VITALS — BP 120/80 | HR 102 | Ht 64.0 in | Wt 203.0 lb

## 2022-02-01 DIAGNOSIS — E1165 Type 2 diabetes mellitus with hyperglycemia: Secondary | ICD-10-CM

## 2022-02-01 MED ORDER — TIRZEPATIDE 7.5 MG/0.5ML ~~LOC~~ SOAJ
7.5000 mg | SUBCUTANEOUS | 3 refills | Status: DC
  Filled 2022-02-01 – 2022-03-19 (×2): qty 2, 28d supply, fill #0
  Filled 2022-04-24: qty 6, 84d supply, fill #1
  Filled 2022-04-24: qty 2, 28d supply, fill #1

## 2022-02-01 NOTE — Progress Notes (Unsigned)
Name: Laura Marsh  Age/ Sex: 66 y.o., female   MRN/ DOB: [id number], 11/26/1955     PCP: Laura Ruddy, MD   Reason for Endocrinology Evaluation: Type 2 Diabetes Mellitus     Initial Endocrinology Clinic Visit: 06/16/2018    PATIENT IDENTIFIER: [name] is a 66 y.o. female with a past medical history of HTN, T2DM and Dyslipidemia. The patient has followed with Endocrinology clinic since 06/16/2018 for consultative assistance with management of her diabetes.  DIABETIC HISTORY:  Laura Marsh was diagnosed with T2DM ~ 2014. She has been on oral glycemic agents for years. She has never been on insulin therapy. Her hemoglobin A1c has ranged from 6.5% in 2014, peaking at 11.0% in 2019.   On her initial visit to our clinic she was on Metformin, Glipizide and Januvia, with an A1c 9.7 %.  We stopped Januvia and glipizide and started Ozempic with continuation of Metformin    [facility]  I have attempted to screen her for Cushing's syndrome due to spontaneous hypokalemia and weight gain in 06/2020 but she did not return the urine sample.   SUBJECTIVE:   During the last visit (03/13/2021): A1c 8.5 % .     Today (02/01/2022): Laura Marsh is here for a follow up on diabetes follow up.  She checks her blood sugars occasionally  . The patient has not had hypoglycemic episodes since the last clinic visit.    She has been having recurrent yeats infections with Laura Marsh   Denies nausea, or vomiting    HOME DIABETES REGIMEN:  Metformin 1000 mg Once daily Glipizide 10 mg , 1 tablet BID Mounjaro 5 mg weekly  Farxiga 10 mg daily      GLUCOSE LOG   91- 218 mg/dL    DIABETIC COMPLICATIONS: Microvascular complications:   Denies: CKD, retinopathy, neuropathy  Last eye exam: Completed 02/03/2021   Macrovascular complications: Denies: CAD, PVD, CVA    HISTORY:  Past Medical History:  Past Medical History:  Diagnosis Date    Colon polyp    Diabetes mellitus    Elevated cholesterol    Hypertension    Past Surgical History:  Past Surgical History:  Procedure Laterality Date   ABDOMINAL HYSTERECTOMY     COLONOSCOPY     COLONOSCOPY WITH PROPOFOL N/A 11/08/2017   Procedure: COLONOSCOPY WITH PROPOFOL;  Surgeon: Toledo, Benay Pike, MD;  Location: [facility] ENDOSCOPY;  Service: Gastroenterology;  Laterality: N/A;   COLONOSCOPY WITH PROPOFOL N/A 04/28/2020   Procedure: COLONOSCOPY WITH PROPOFOL;  Surgeon: Benjamine Sprague, DO;  Location: [facility] ENDOSCOPY;  Service: General;  Laterality: N/A;   Social History:  reports that she has never smoked. She has never used smokeless tobacco. She reports current alcohol use. She reports that she does not use drugs. Family History:  Family History  Problem Relation Age of Onset   Diabetes Mother    Congestive Heart Failure Mother      HOME MEDICATIONS: Allergies as of 02/01/2022       Reactions   Trulicity [dulaglutide] Itching   Rash/itching at injection site        Medication List        Accurate as of February 01, 2022  3:07 PM. If you have any questions, ask your nurse or doctor.          acetaminophen 500 MG tablet Commonly known as: TYLENOL Take 500 mg by mouth as needed.  albuterol 108 (90 Base) MCG/ACT inhaler Commonly known as: VENTOLIN HFA Inhale 1-2 puffs into the lungs every 6 (six) hours as needed for wheezing or shortness of breath.   Alcohol Swabs Pads Use as directed.   amLODipine 5 MG tablet Commonly known as: NORVASC Take 1 tablet  by mouth daily.   amoxicillin-clavulanate 875-125 MG tablet Commonly known as: AUGMENTIN Take 1 tablet by mouth every 12 (twelve) hours.   aspirin EC 81 MG tablet Take 81 mg by mouth daily. Swallow whole. Taken it twice a week   atorvastatin 20 MG tablet Commonly known as: LIPITOR Take 1 tablet  by mouth daily.   benzonatate 100 MG capsule Commonly known as: Tessalon Perles Take 1 to 2 capsules up to  twice daily as needed for cough   Dexcom G6 Sensor Misc 1 Device by Does not apply route as directed.   Dexcom G6 Sensor Misc Use as directed every 10 days (Use as directed every 10 days)   Dexcom G6 Transmitter Misc 1 Device by Does not apply route as directed.   Dexcom G6 Transmitter Misc Use as directed (Use as directed)   Farxiga 10 MG Tabs tablet Generic drug: dapagliflozin propanediol Take 1 tablet by mouth daily before breakfast.   fluticasone 50 MCG/ACT nasal spray Commonly known as: FLONASE Place 2 sprays into both nostrils daily.   FREESTYLE LITE test strip Generic drug: glucose blood Use to test blood sugar 2 (two) times daily.   glipiZIDE 10 MG tablet Commonly known as: GLUCOTROL Take 1 tablet by mouth 2 (two) times daily before a meal.   lisinopril 30 MG tablet Commonly known as: ZESTRIL TAKE 1 TABLET BY MOUTH ONCE DAILY   loratadine 10 MG tablet Commonly known as: CLARITIN Take 10 mg by mouth daily as needed.   METAMUCIL FIBER PO Take by mouth in the morning and at bedtime.   metFORMIN 1000 MG tablet Commonly known as: GLUCOPHAGE Take 1 tablet by mouth daily with breakfast.   Mounjaro 5 MG/0.5ML Pen Generic drug: tirzepatide Inject 5 mg into the skin once a week.   multivitamin with minerals Tabs tablet Take 1 tablet by mouth daily.   potassium chloride SA 20 MEQ tablet Commonly known as: KLOR-CON M Take 1 tablet (20 mEq total) by mouth daily.   promethazine-dextromethorphan 6.25-15 MG/5ML syrup Commonly known as: PROMETHAZINE-DM Take 5 mLs by mouth 3 (three) times daily as needed for cough.   triamterene-hydrochlorothiazide 37.5-25 MG tablet Commonly known as: MAXZIDE-25 Take 1 tablet by mouth daily.   Vitamin D (Ergocalciferol) 1.25 MG (50000 UNIT) Caps capsule Commonly known as: DRISDOL Take 1 capsule (50,000 Units total) by mouth every 7 (seven) days.      PHYSICAL EXAM: VS: There were no vitals taken for this visit.    EXAM: General: Pt appears well and is in NAD  Lungs: Clear with good BS bilat with no rales, rhonchi, or wheezes  Heart: Auscultation: RRR  Periph. circulation: non pitting edema  Mental Status: Judgment, insight: intact Orientation: oriented to time, place, and person Mood and affect: no depression, anxiety, or agitation    DM Foot exam 06/20/2020  The skin of the feet is intact without sores or ulcerations. The pedal pulses are 2+ on right and 2+ on left. The sensation is intact to a screening 5.07, 10 gram monofilament bilaterally    DATA REVIEWED:  Lab Results  Component Value Date   HGBA1C 8.5 (H) 12/18/2021   HGBA1C 8.1 (A) 07/17/2021   HGBA1C  8.3 (A) 03/13/2021      Lab Results  Component Value Date   CHOL 164 12/18/2021   HDL 54.30 12/18/2021   LDLCALC 95 12/18/2021   LDLDIRECT 140.3 02/19/2013   TRIG 75.0 12/18/2021   CHOLHDL 3 12/18/2021         Latest Reference Range & Units 12/18/21 09:49  Sodium 135 - 145 mEq/L 141  Potassium 3.5 - 5.1 mEq/L 3.6  Chloride 96 - 112 mEq/L 103  CO2 19 - 32 mEq/L 28  Glucose 70 - 99 mg/dL 181 (H)  BUN 6 - 23 mg/dL 15  Creatinine 0.40 - 1.20 mg/dL 0.85  Calcium 8.4 - 10.5 mg/dL 10.1  GFR >60.00 mL/min 72.62     Latest Reference Range & Units 12/18/21 09:49  Total CHOL/HDL Ratio  3  Cholesterol 0 - 200 mg/dL 164  HDL Cholesterol >39.00 mg/dL 54.30  LDL (calc) 0 - 99 mg/dL 95  NonHDL  109.98  Triglycerides 0.0 - 149.0 mg/dL 75.0  VLDL 0.0 - 40.0 mg/dL 15.0    Latest Reference Range & Units 12/18/21 09:49  TSH 0.35 - 5.50 uIU/mL 1.94  T4,Free(Direct) 0.60 - 1.60 ng/dL 0.93     ASSESSMENT / PLAN / RECOMMENDATIONS:   1) Type 2 Diabetes Mellitus, Poorly controlled, Without complications - Most recent A1c of 8.5 %. Goal A1c < 7.0 %.     -A1c continues to be above goal -In manually reviewing her glucose meter, she has been noted with optimal BG's but unfortunately these are not daily and not over the past 3  months.  We did discuss the importance of following a healthy lifestyle choices long-term for that to reflect on her A1c -I also explained to the patient that we are running out of options to control her glucose due to variable medication side effects or intolerances -She is unable to increase glipizide due to constipation in the past - She is intolerant to higher doses of metformin -She did endorse recurrent yeast infections on Farxiga, I have recommended discontinuation but she would like to use OTC Monistat and see if that helps -She is out of Ozempic and I have recommended trying Mounjaro at this time, if this is not covered then I will refill Ozempic -We discussed GI side effects with Mounjaro,she was provided with a coupon today   MEDICATIONS: Continue Metformin 1000 mg Once daily Continue glipizide 10 mg, 1 tablet before Breakfast and 1 tablet before Supper Continue Farxiga 10 mg daily Stop Ozempic  Start Mounjaro 5 mg weekly       EDUCATION / INSTRUCTIONS: BG monitoring instructions: Patient is instructed to check her blood sugars 2 times a day, fasting and bedtime. Call [facility] if: BG persistently < 70  I reviewed the Rule of 15 for the treatment of hypoglycemia in detail with the patient. Literature supplied.  2) Diabetic complications:  Eye: Does not have known diabetic retinopathy.  Neuro/ Feet: Does not have known diabetic peripheral neuropathy. Renal: Patient does not have known baseline CKD. She is  on an ACEI/ARB at present.  Micro albumin urea testing is normal and up-to-date       F/U in  6 months    Signed electronically by: Mack Guise, MD  [facility]  [address] [place], [place] [address] Phone: [phone] FAX: [phone]   CC: Laura Marsh, [place] [address] Phone: [phone]  Fax: [phone]  Return to Endocrinology  clinic as below: No future appointments.

## 2022-02-01 NOTE — Patient Instructions (Addendum)
-   STOP Farxiga  - Continue Glipizide 10 mg, 1 tablet before Breakfast and 1 tablet before Supper - Continue Metformin 1000 mg daily  - Increase Mounjaro 7.5 mg weekly     HOW TO TREAT LOW BLOOD SUGARS (Blood sugar LESS THAN 70 MG/DL) Please follow the RULE OF 15 for the treatment of hypoglycemia treatment (when your (blood sugars are less than 70 mg/dL)   STEP 1: Take 15 grams of carbohydrates when your blood sugar is low, which includes:  3-4 GLUCOSE TABS  OR 3-4 OZ OF JUICE OR REGULAR SODA OR ONE TUBE OF GLUCOSE GEL    STEP 2: RECHECK blood sugar in 15 MINUTES STEP 3: If your blood sugar is still low at the 15 minute recheck --> then, go back to STEP 1 and treat AGAIN with another 15 grams of carbohydrates.

## 2022-02-02 ENCOUNTER — Other Ambulatory Visit (HOSPITAL_COMMUNITY): Payer: Self-pay

## 2022-02-02 MED ORDER — GLIPIZIDE 10 MG PO TABS
10.0000 mg | ORAL_TABLET | Freq: Two times a day (BID) | ORAL | 3 refills | Status: DC
  Filled 2022-02-02 – 2022-03-04 (×2): qty 180, 90d supply, fill #0
  Filled 2022-05-25: qty 180, 90d supply, fill #1

## 2022-02-02 MED ORDER — METFORMIN HCL 1000 MG PO TABS
1000.0000 mg | ORAL_TABLET | Freq: Every day | ORAL | 3 refills | Status: DC
  Filled 2022-02-02: qty 90, fill #0
  Filled 2022-02-22 – 2022-02-23 (×2): qty 90, 90d supply, fill #0
  Filled 2022-05-25: qty 90, 90d supply, fill #1

## 2022-02-05 ENCOUNTER — Ambulatory Visit: Payer: 59 | Admitting: Internal Medicine

## 2022-02-09 ENCOUNTER — Other Ambulatory Visit (HOSPITAL_COMMUNITY): Payer: Self-pay

## 2022-02-12 ENCOUNTER — Other Ambulatory Visit (HOSPITAL_COMMUNITY): Payer: Self-pay

## 2022-02-22 ENCOUNTER — Other Ambulatory Visit: Payer: Self-pay | Admitting: Family Medicine

## 2022-02-22 ENCOUNTER — Other Ambulatory Visit (HOSPITAL_COMMUNITY): Payer: Self-pay

## 2022-02-22 DIAGNOSIS — I10 Essential (primary) hypertension: Secondary | ICD-10-CM

## 2022-02-22 MED ORDER — TRIAMTERENE-HCTZ 37.5-25 MG PO TABS
1.0000 | ORAL_TABLET | Freq: Every day | ORAL | 1 refills | Status: DC
  Filled 2022-02-22: qty 90, 90d supply, fill #0
  Filled 2022-05-25: qty 90, 90d supply, fill #1

## 2022-02-23 ENCOUNTER — Other Ambulatory Visit (HOSPITAL_COMMUNITY): Payer: Self-pay

## 2022-02-24 ENCOUNTER — Other Ambulatory Visit (HOSPITAL_COMMUNITY): Payer: Self-pay

## 2022-03-04 ENCOUNTER — Other Ambulatory Visit (HOSPITAL_COMMUNITY): Payer: Self-pay

## 2022-03-20 ENCOUNTER — Other Ambulatory Visit (HOSPITAL_COMMUNITY): Payer: Self-pay

## 2022-04-24 ENCOUNTER — Other Ambulatory Visit (HOSPITAL_COMMUNITY): Payer: Self-pay

## 2022-04-30 ENCOUNTER — Other Ambulatory Visit (HOSPITAL_COMMUNITY): Payer: Self-pay

## 2022-05-06 ENCOUNTER — Other Ambulatory Visit (HOSPITAL_COMMUNITY): Payer: Self-pay

## 2022-05-18 ENCOUNTER — Other Ambulatory Visit (HOSPITAL_COMMUNITY): Payer: Self-pay

## 2022-05-18 ENCOUNTER — Other Ambulatory Visit: Payer: Self-pay | Admitting: Family Medicine

## 2022-05-18 MED ORDER — AMLODIPINE BESYLATE 5 MG PO TABS
5.0000 mg | ORAL_TABLET | Freq: Every day | ORAL | 1 refills | Status: DC
  Filled 2022-05-18: qty 90, 90d supply, fill #0
  Filled 2022-08-09: qty 90, 90d supply, fill #1

## 2022-05-25 ENCOUNTER — Other Ambulatory Visit (HOSPITAL_COMMUNITY): Payer: Self-pay

## 2022-05-25 DIAGNOSIS — K5900 Constipation, unspecified: Secondary | ICD-10-CM | POA: Diagnosis not present

## 2022-05-25 DIAGNOSIS — K648 Other hemorrhoids: Secondary | ICD-10-CM | POA: Diagnosis not present

## 2022-05-25 DIAGNOSIS — E1169 Type 2 diabetes mellitus with other specified complication: Secondary | ICD-10-CM | POA: Diagnosis not present

## 2022-05-25 DIAGNOSIS — D128 Benign neoplasm of rectum: Secondary | ICD-10-CM | POA: Diagnosis not present

## 2022-05-25 DIAGNOSIS — E1159 Type 2 diabetes mellitus with other circulatory complications: Secondary | ICD-10-CM | POA: Diagnosis not present

## 2022-05-25 DIAGNOSIS — I152 Hypertension secondary to endocrine disorders: Secondary | ICD-10-CM | POA: Diagnosis not present

## 2022-05-25 MED ORDER — HYDROCORT-PRAMOXINE (PERIANAL) 2.5-1 % EX CREA
TOPICAL_CREAM | CUTANEOUS | 0 refills | Status: DC
  Filled 2022-05-25: qty 30, 10d supply, fill #0
  Filled 2022-09-07: qty 30, fill #0

## 2022-05-25 MED ORDER — NA SULFATE-K SULFATE-MG SULF 17.5-3.13-1.6 GM/177ML PO SOLN
ORAL | 0 refills | Status: DC
  Filled 2022-05-25: qty 354, 1d supply, fill #0
  Filled 2022-09-07: qty 354, 30d supply, fill #0

## 2022-06-07 ENCOUNTER — Other Ambulatory Visit (HOSPITAL_COMMUNITY): Payer: Self-pay

## 2022-06-12 ENCOUNTER — Other Ambulatory Visit (HOSPITAL_COMMUNITY): Payer: Self-pay

## 2022-06-18 ENCOUNTER — Encounter: Payer: Self-pay | Admitting: Internal Medicine

## 2022-06-18 ENCOUNTER — Other Ambulatory Visit (HOSPITAL_COMMUNITY): Payer: Self-pay

## 2022-06-18 ENCOUNTER — Ambulatory Visit: Payer: Commercial Managed Care - PPO | Admitting: Internal Medicine

## 2022-06-18 VITALS — BP 124/80 | HR 96 | Ht 64.0 in | Wt 199.4 lb

## 2022-06-18 DIAGNOSIS — E1165 Type 2 diabetes mellitus with hyperglycemia: Secondary | ICD-10-CM

## 2022-06-18 LAB — POCT GLUCOSE (DEVICE FOR HOME USE): Glucose Fasting, POC: 219 mg/dL — AB (ref 70–99)

## 2022-06-18 LAB — POCT GLYCOSYLATED HEMOGLOBIN (HGB A1C): Hemoglobin A1C: 9.7 % — AB (ref 4.0–5.6)

## 2022-06-18 MED ORDER — TIRZEPATIDE 10 MG/0.5ML ~~LOC~~ SOAJ
10.0000 mg | SUBCUTANEOUS | 3 refills | Status: DC
  Filled 2022-06-18 – 2022-07-17 (×2): qty 2, 28d supply, fill #0
  Filled 2022-08-09: qty 2, 28d supply, fill #1
  Filled 2022-09-07: qty 2, 28d supply, fill #2
  Filled 2022-10-18: qty 2, 28d supply, fill #3
  Filled 2022-11-16: qty 2, 28d supply, fill #4
  Filled 2022-12-11: qty 2, 28d supply, fill #5

## 2022-06-18 MED ORDER — GLIPIZIDE 10 MG PO TABS
15.0000 mg | ORAL_TABLET | Freq: Two times a day (BID) | ORAL | 3 refills | Status: DC
  Filled 2022-06-18 – 2022-08-09 (×2): qty 270, 90d supply, fill #0
  Filled 2022-12-24: qty 270, 90d supply, fill #1

## 2022-06-18 MED ORDER — METFORMIN HCL ER 750 MG PO TB24
750.0000 mg | ORAL_TABLET | Freq: Every day | ORAL | 3 refills | Status: DC
  Filled 2022-06-18: qty 90, 90d supply, fill #0
  Filled 2022-08-09 – 2022-11-30 (×2): qty 90, 90d supply, fill #1

## 2022-06-18 NOTE — Patient Instructions (Addendum)
-   Change Metformin 750 mg XR - Increase Glipizide 10 mg, 1.5 tablet before Breakfast and 1.5 tablet before Supper - Increase Mounjaro 10 mg weekly   Carbohydrate limit is 45-60 per MEAL  and less than 15 grams for a snack ( but I prefer zero for snacks)   HOW TO TREAT LOW BLOOD SUGARS (Blood sugar LESS THAN 70 MG/DL) Please follow the RULE OF 15 for the treatment of hypoglycemia treatment (when your (blood sugars are less than 70 mg/dL)   STEP 1: Take 15 grams of carbohydrates when your blood sugar is low, which includes:  3-4 GLUCOSE TABS  OR 3-4 OZ OF JUICE OR REGULAR SODA OR ONE TUBE OF GLUCOSE GEL    STEP 2: RECHECK blood sugar in 15 MINUTES STEP 3: If your blood sugar is still low at the 15 minute recheck --> then, go back to STEP 1 and treat AGAIN with another 15 grams of carbohydrates.

## 2022-06-18 NOTE — Progress Notes (Signed)
Name: Laura Marsh  Age/ Sex: 67 y.o., female   MRN/ DOB: [id number], [birth year]     PCP: Billie Ruddy, MD   Reason for Endocrinology Evaluation: Type 2 Diabetes Mellitus     Initial Endocrinology Clinic Visit: 06/16/2018    PATIENT IDENTIFIER: Laura Marsh is a 67 y.o. female with a past medical history of HTN, T2DM and Dyslipidemia. The patient has followed with Endocrinology clinic since 06/16/2018 for consultative assistance with management of her diabetes.  DIABETIC HISTORY:  Laura Marsh was diagnosed with T2DM ~ 2014. She has been on oral glycemic agents for years. She has never been on insulin therapy. Her hemoglobin A1c has ranged from 6.5% in 2014, peaking at 11.0% in 2019.   On her initial visit to our clinic she was on Metformin, Glipizide and Januvia, with an A1c 9.7 %.  We stopped Januvia and glipizide and started Ozempic with continuation of Metformin    [facility]  I have attempted to screen her for Cushing's syndrome due to spontaneous hypokalemia and weight gain in 06/2020 but she did not return the urine sample.    Stopped Wilder Glade 01/2022 due to recurrent yeast infections SUBJECTIVE:   During the last visit (02/01/2022): A1c 8.5 % .     Today (06/18/2022): Laura Marsh is here for a follow up on diabetes follow up.  She checks her blood sugars 1 tablets daily  . The patient has not had hypoglycemic episodes since the last clinic visit.    She snacks at night  She avoids sugar- sweetened beverages , she started sunkist but she  believes its zero sugar  She denies constipation    HOME DIABETES REGIMEN:  Metformin 1000 mg at lunch  Glipizide 10 mg , 1 tablet BID Mounjaro 7.5 mg weekly ( Sundays)       GLUCOSE LOG   91- 218 mg/dL    DIABETIC COMPLICATIONS: Microvascular complications:   Denies: CKD, retinopathy, neuropathy  Last eye exam: Completed 02/03/2021   Macrovascular  complications: Denies: CAD, PVD, CVA    HISTORY:  Past Medical History:  Past Medical History:  Diagnosis Date   Colon polyp    Diabetes mellitus    Elevated cholesterol    Hypertension    Past Surgical History:  Past Surgical History:  Procedure Laterality Date   ABDOMINAL HYSTERECTOMY     COLONOSCOPY     COLONOSCOPY WITH PROPOFOL N/A 11/08/2017   Procedure: COLONOSCOPY WITH PROPOFOL;  Surgeon: Toledo, Benay Pike, MD;  Location: [facility] ENDOSCOPY;  Service: Gastroenterology;  Laterality: N/A;   COLONOSCOPY WITH PROPOFOL N/A 04/28/2020   Procedure: COLONOSCOPY WITH PROPOFOL;  Surgeon: Benjamine Sprague, DO;  Location: [facility] ENDOSCOPY;  Service: General;  Laterality: N/A;   Social History:  reports that she has never smoked. She has never used smokeless tobacco. She reports current alcohol use. She reports that she does not use drugs. Family History:  Family History  Problem Relation Age of Onset   Diabetes Mother    Congestive Heart Failure Mother      HOME MEDICATIONS: Allergies as of 06/18/2022       Reactions   Trulicity [dulaglutide] Itching   Rash/itching at injection site        Medication List        Accurate as of June 18, 2022  3:12 PM. If you have any questions, ask your nurse or doctor.  STOP taking these medications    potassium chloride SA 20 MEQ tablet Commonly known as: KLOR-CON M Stopped by: Dorita Sciara, MD       TAKE these medications    albuterol 108 (90 Base) MCG/ACT inhaler Commonly known as: VENTOLIN HFA Inhale 1-2 puffs into the lungs every 6 (six) hours as needed for wheezing or shortness of breath.   Alcohol Swabs Pads Use as directed.   amLODipine 5 MG tablet Commonly known as: NORVASC Take 1 tablet  by mouth daily.   aspirin EC 81 MG tablet Take 81 mg by mouth daily. Swallow whole. Taken it twice a week   atorvastatin 20 MG tablet Commonly known as: LIPITOR Take 1 tablet  by mouth daily.   Dexcom G6  Sensor Misc Use as directed every 10 days (Use as directed every 10 days) What changed: Another medication with the same name was removed. Continue taking this medication, and follow the directions you see here. Changed by: Dorita Sciara, MD   Dexcom G6 Transmitter Misc Use as directed (Use as directed) What changed: Another medication with the same name was removed. Continue taking this medication, and follow the directions you see here. Changed by: Dorita Sciara, MD   fluticasone 50 MCG/ACT nasal spray Commonly known as: FLONASE Place 2 sprays into both nostrils daily.   FREESTYLE LITE test strip Generic drug: glucose blood Use to test blood sugar 2 (two) times daily.   glipiZIDE 10 MG tablet Commonly known as: GLUCOTROL Take 1 tablet (10 mg total) by mouth 2 (two) times daily before a meal.   hydrocortisone-pramoxine 2.5-1 % rectal cream Commonly known as: ANALPRAM-HC Place rectally 3 (three) times daily for 10 days   lisinopril 30 MG tablet Commonly known as: ZESTRIL TAKE 1 TABLET BY MOUTH ONCE DAILY   loratadine 10 MG tablet Commonly known as: CLARITIN Take 10 mg by mouth daily as needed.   METAMUCIL FIBER PO Take by mouth in the morning and at bedtime.   metFORMIN 1000 MG tablet Commonly known as: GLUCOPHAGE Take 1 tablet (1,000 mg total) by mouth daily with breakfast.   Mounjaro 7.5 MG/0.5ML Pen Generic drug: tirzepatide Inject 7.5 mg into the skin once a week.   multivitamin with minerals Tabs tablet Take 1 tablet by mouth daily.   Na Sulfate-K Sulfate-Mg Sulf 17.5-3.13-1.6 GM/177ML Soln Use as directed by MD   triamterene-hydrochlorothiazide 37.5-25 MG tablet Commonly known as: MAXZIDE-25 Take 1 tablet by mouth daily.   Vitamin D (Ergocalciferol) 1.25 MG (50000 UNIT) Caps capsule Commonly known as: DRISDOL Take 1 capsule (50,000 Units total) by mouth every 7 (seven) days.      PHYSICAL EXAM: VS: BP 124/80 (BP Location: Left Arm,  Patient Position: Sitting, Cuff Size: Large)   Pulse 96   Ht 5\' 4"  (1.626 m)   Wt 199 lb 6.4 oz (90.4 kg)   SpO2 100%   BMI 34.23 kg/m    EXAM: General: Pt appears well and is in NAD  Lungs: Clear with good BS bilat   Heart: Auscultation: RRR    LE: Trace pitting edema   Mental Status: Judgment, insight: intact Mood and affect: no depression, anxiety, or agitation    DM Foot exam 06/18/2022  The skin of the feet is intact without sores or ulcerations. The pedal pulses are 2+ on right and 2+ on left. The sensation is intact to a screening 5.07, 10 gram monofilament bilaterally    DATA REVIEWED:  Lab Results  Component  Value Date   HGBA1C 9.7 (A) 06/18/2022   HGBA1C 8.5 (H) 12/18/2021   HGBA1C 8.1 (A) 07/17/2021      Lab Results  Component Value Date   CHOL 164 12/18/2021   HDL 54.30 12/18/2021   LDLCALC 95 12/18/2021   LDLDIRECT 140.3 02/19/2013   TRIG 75.0 12/18/2021   CHOLHDL 3 12/18/2021         Latest Reference Range & Units 12/18/21 09:49  Sodium 135 - 145 mEq/L 141  Potassium 3.5 - 5.1 mEq/L 3.6  Chloride 96 - 112 mEq/L 103  CO2 19 - 32 mEq/L 28  Glucose 70 - 99 mg/dL 181 (H)  BUN 6 - 23 mg/dL 15  Creatinine 0.40 - 1.20 mg/dL 0.85  Calcium 8.4 - 10.5 mg/dL 10.1  GFR >60.00 mL/min 72.62     Latest Reference Range & Units 12/18/21 09:49  Total CHOL/HDL Ratio  3  Cholesterol 0 - 200 mg/dL 164  HDL Cholesterol >39.00 mg/dL 54.30  LDL (calc) 0 - 99 mg/dL 95  NonHDL  109.98  Triglycerides 0.0 - 149.0 mg/dL 75.0  VLDL 0.0 - 40.0 mg/dL 15.0    Latest Reference Range & Units 12/18/21 09:49  TSH 0.35 - 5.50 uIU/mL 1.94  T4,Free(Direct) 0.60 - 1.60 ng/dL 0.93     ASSESSMENT / PLAN / RECOMMENDATIONS:   1) Type 2 Diabetes Mellitus, Poorly controlled, Without complications - Most recent A1c of 9.7 %. Goal A1c < 7.0 %.     -Patient continues with worsening glycemic control, her A1c has increased from 8.5% to 9.7%, the only change she has made is  starting Sunkist drink, she does believe this has no sugar in it, I did advise the patient to verify carbohydrate content of all her drinks -We also discussed the carbohydrate limit per meal 45-60 g, and less than 15 g per snack -Historically she has not been able to tolerate higher doses of glipizide due to constipation but she would like to try increasing it - She is intolerant to higher doses of metformin, she is currently on 1000 mg of metformin regular release, I will switch this to XR 750 Mg -Intolerant to [place] due to recurrent  genital infection  -Will increase Mounjaro -I did explain to the patient that if her A1c >10.0%, we will have no option but to put her on insulin   MEDICATIONS:   Change metformin 750 mg XR daily with lunch Increase glipizide 10 mg, 1.5 tablet before Breakfast and 1.5 tablet before Supper Increase Mounjaro 10 mg weekly       EDUCATION / INSTRUCTIONS: BG monitoring instructions: Patient is instructed to check her blood sugars 2 times a day, fasting and bedtime. Call [facility] if: BG persistently < 70  I reviewed the Rule of 15 for the treatment of hypoglycemia in detail with the patient. Literature supplied.  2) Diabetic complications:  Eye: Does not have known diabetic retinopathy.  Neuro/ Feet: Does not have known diabetic peripheral neuropathy. Renal: Patient does not have known baseline CKD. She is  on an ACEI/ARB at present.  Micro albumin urea testing is normal and up-to-date   F/U in  4 months    Signed electronically by: Mack Guise, MD  [facility] Endocrinology  [facility] [address] [place], [place] [address] Phone: [phone] FAX: [phone]   CC: Billie Ruddy, [place] [address] Phone: [phone]  Fax: [phone]  Return to Endocrinology clinic as below:  No future appointments.

## 2022-06-24 ENCOUNTER — Other Ambulatory Visit: Payer: Self-pay

## 2022-07-17 ENCOUNTER — Other Ambulatory Visit (HOSPITAL_COMMUNITY): Payer: Self-pay

## 2022-08-09 ENCOUNTER — Other Ambulatory Visit: Payer: Self-pay | Admitting: Family Medicine

## 2022-08-09 ENCOUNTER — Other Ambulatory Visit (HOSPITAL_COMMUNITY): Payer: Self-pay

## 2022-08-09 ENCOUNTER — Other Ambulatory Visit: Payer: Self-pay

## 2022-08-09 DIAGNOSIS — I10 Essential (primary) hypertension: Secondary | ICD-10-CM

## 2022-08-09 MED ORDER — TRIAMTERENE-HCTZ 37.5-25 MG PO TABS
1.0000 | ORAL_TABLET | Freq: Every day | ORAL | 1 refills | Status: DC
  Filled 2022-08-09: qty 90, 90d supply, fill #0
  Filled 2022-09-07: qty 90, 90d supply, fill #1

## 2022-08-11 ENCOUNTER — Other Ambulatory Visit (HOSPITAL_COMMUNITY): Payer: Self-pay

## 2022-08-11 MED ORDER — ATORVASTATIN CALCIUM 20 MG PO TABS
20.0000 mg | ORAL_TABLET | Freq: Every day | ORAL | 1 refills | Status: DC
  Filled 2022-08-11: qty 90, 90d supply, fill #0

## 2022-09-07 ENCOUNTER — Other Ambulatory Visit: Payer: Self-pay | Admitting: Family Medicine

## 2022-09-07 ENCOUNTER — Other Ambulatory Visit: Payer: Self-pay | Admitting: Internal Medicine

## 2022-09-07 ENCOUNTER — Other Ambulatory Visit (HOSPITAL_COMMUNITY): Payer: Self-pay

## 2022-09-07 ENCOUNTER — Other Ambulatory Visit: Payer: Self-pay

## 2022-09-07 MED ORDER — DEXCOM G6 SENSOR MISC
11 refills | Status: DC
  Filled 2022-09-07: qty 3, 30d supply, fill #0

## 2022-09-07 MED ORDER — AMLODIPINE BESYLATE 5 MG PO TABS
5.0000 mg | ORAL_TABLET | Freq: Every day | ORAL | 1 refills | Status: DC
  Filled 2022-09-07 – 2022-11-16 (×2): qty 90, 90d supply, fill #0

## 2022-09-07 MED ORDER — DEXCOM G6 TRANSMITTER MISC
3 refills | Status: DC
  Filled 2022-09-07: qty 1, 90d supply, fill #0

## 2022-09-08 ENCOUNTER — Other Ambulatory Visit: Payer: Self-pay

## 2022-09-08 ENCOUNTER — Other Ambulatory Visit (HOSPITAL_COMMUNITY): Payer: Self-pay

## 2022-09-10 ENCOUNTER — Encounter: Payer: Self-pay | Admitting: Internal Medicine

## 2022-09-10 ENCOUNTER — Other Ambulatory Visit (HOSPITAL_COMMUNITY): Payer: Self-pay

## 2022-09-11 ENCOUNTER — Other Ambulatory Visit (HOSPITAL_COMMUNITY): Payer: Self-pay

## 2022-09-15 ENCOUNTER — Other Ambulatory Visit (HOSPITAL_COMMUNITY): Payer: Self-pay

## 2022-09-15 ENCOUNTER — Ambulatory Visit
Admission: RE | Admit: 2022-09-15 | Discharge: 2022-09-15 | Disposition: A | Discharge status: Home / Self Care | Payer: Commercial Managed Care - PPO | Attending: Internal Medicine | Admitting: Internal Medicine

## 2022-09-15 ENCOUNTER — Encounter
Admission: RE | Disposition: A | Discharge status: Home / Self Care | Payer: Self-pay | Source: Home / Self Care | Attending: Internal Medicine

## 2022-09-15 ENCOUNTER — Ambulatory Visit: Payer: Commercial Managed Care - PPO | Admitting: Anesthesiology

## 2022-09-15 DIAGNOSIS — K621 Rectal polyp: Secondary | ICD-10-CM | POA: Diagnosis not present

## 2022-09-15 DIAGNOSIS — K640 First degree hemorrhoids: Secondary | ICD-10-CM | POA: Insufficient documentation

## 2022-09-15 DIAGNOSIS — Z09 Encounter for follow-up examination after completed treatment for conditions other than malignant neoplasm: Secondary | ICD-10-CM | POA: Diagnosis not present

## 2022-09-15 DIAGNOSIS — Z86010 Personal history of colonic polyps: Secondary | ICD-10-CM | POA: Insufficient documentation

## 2022-09-15 DIAGNOSIS — D128 Benign neoplasm of rectum: Secondary | ICD-10-CM | POA: Diagnosis not present

## 2022-09-15 DIAGNOSIS — K648 Other hemorrhoids: Secondary | ICD-10-CM | POA: Diagnosis not present

## 2022-09-15 HISTORY — PX: COLONOSCOPY: SHX5424

## 2022-09-15 LAB — GLUCOSE, CAPILLARY: Glucose-Capillary: 176 mg/dL — ABNORMAL HIGH (ref 70–99)

## 2022-09-15 SURGERY — COLONOSCOPY
Anesthesia: General

## 2022-09-15 MED ORDER — PROPOFOL 10 MG/ML IV BOLUS
INTRAVENOUS | Status: DC | PRN
  Administered 2022-09-15 (×3): 20 mg via INTRAVENOUS
  Administered 2022-09-15: 30 mg via INTRAVENOUS
  Administered 2022-09-15 (×4): 20 mg via INTRAVENOUS
  Administered 2022-09-15: 50 mg via INTRAVENOUS
  Administered 2022-09-15 (×16): 20 mg via INTRAVENOUS

## 2022-09-15 MED ORDER — LIDOCAINE HCL (PF) 2 % IJ SOLN
INTRAMUSCULAR | Status: DC | PRN
  Administered 2022-09-15: 40 mg via INTRADERMAL

## 2022-09-15 MED ORDER — SODIUM CHLORIDE 0.9 % IV SOLN
INTRAVENOUS | Status: DC
  Administered 2022-09-15: 100 mL via INTRAVENOUS

## 2022-09-15 MED ORDER — LIDOCAINE HCL (PF) 2 % IJ SOLN
INTRAMUSCULAR | Status: AC
  Filled 2022-09-15: qty 5

## 2022-09-15 MED ORDER — PROPOFOL 1000 MG/100ML IV EMUL
INTRAVENOUS | Status: AC
  Filled 2022-09-15: qty 100

## 2022-09-15 NOTE — Anesthesia Preprocedure Evaluation (Signed)
Anesthesia Evaluation  Patient identified by MRN, date of birth, ID band Patient awake    Reviewed: Allergy & Precautions, NPO status , Patient's Chart, lab work & pertinent test results  History of Anesthesia Complications Negative for: history of anesthetic complications  Airway Mallampati: IV   Neck ROM: Full    Dental  (+) Missing   Pulmonary neg pulmonary ROS   Pulmonary exam normal breath sounds clear to auscultation       Cardiovascular hypertension, Normal cardiovascular exam Rhythm:Regular Rate:Normal     Neuro/Psych negative neurological ROS     GI/Hepatic negative GI ROS,,,  Endo/Other  diabetes, Type 2  Obesity   Renal/GU negative Renal ROS     Musculoskeletal   Abdominal   Peds  Hematology negative hematology ROS (+)   Anesthesia Other Findings Last dose of Mounjaro 09/05/22.  Reproductive/Obstetrics                             Anesthesia Physical Anesthesia Plan  ASA: 2  Anesthesia Plan: General   Post-op Pain Management:    Induction: Intravenous  PONV Risk Score and Plan: 3 and Propofol infusion, TIVA and Treatment may vary due to age or medical condition  Airway Management Planned: Natural Airway  Additional Equipment:   Intra-op Plan:   Post-operative Plan:   Informed Consent: I have reviewed the patients History and Physical, chart, labs and discussed the procedure including the risks, benefits and alternatives for the proposed anesthesia with the patient or authorized representative who has indicated his/her understanding and acceptance.       Plan Discussed with: CRNA  Anesthesia Plan Comments: (LMA/GETA backup discussed.  Patient consented for risks of anesthesia including but not limited to:  - adverse reactions to medications - damage to eyes, teeth, lips or other oral mucosa - nerve damage due to positioning  - sore throat or hoarseness -  damage to heart, brain, nerves, lungs, other parts of body or loss of life  Informed patient about role of CRNA in peri- and intra-operative care.  Patient voiced understanding.)       Anesthesia Quick Evaluation

## 2022-09-15 NOTE — H&P (Signed)
Outpatient short stay form Pre-procedure 09/15/2022 9:43 AM Laura Marsh K. Norma Fredrickson, M.D.  Primary Physician: Abbe Amsterdam, M.D.  Reason for visit:  Personal history of adenomatous colon polyps. Adenomatous polyp of rectum Comments: approximately 20mm from anal verge (Jan 22). Orders:   History of present illness:  Ms. Radder complains of hemorrhoidal discomfort with prolapsing and requiring manual replacement into the rectum. She has intermittent constipation and is taking Metamucil Gummies with adequate results. She is not straining as much while on the Metamucil gummy capsules. She has minor amount of bright red blood per rectum and anal rectal discomfort. This occurs 3-4 times per month. She also presents to schedule colonoscopy for complete removal of rectal polyp that was initially found on colonoscopy by another provider (Dr. Tonna Boehringer) on April 28, 2020. The patient denies symptoms of heartburn, waterbrash, eructation, dysphagia or dyspepsia.    No current facility-administered medications for this encounter.  Medications Prior to Admission  Medication Sig Dispense Refill Last Dose   sitaGLIPtin (JANUVIA) 100 MG tablet Take 100 mg by mouth daily.      albuterol (VENTOLIN HFA) 108 (90 Base) MCG/ACT inhaler Inhale 1-2 puffs into the lungs every 6 (six) hours as needed for wheezing or shortness of breath. 8 g 0    Alcohol Swabs PADS Use as directed. 100 each 12    amLODipine (NORVASC) 5 MG tablet Take 1 tablet (5 mg total) by mouth daily. 90 tablet 1    aspirin 81 MG EC tablet Take 81 mg by mouth daily. Swallow whole. Taken it twice a week      atorvastatin (LIPITOR) 20 MG tablet Take 1 tablet  by mouth daily. 90 tablet 1    Continuous Glucose Sensor (DEXCOM G6 SENSOR) MISC Use as directed every 10 days 3 each 11    Continuous Glucose Transmitter (DEXCOM G6 TRANSMITTER) MISC Use as directed 1 each 3    fluticasone (FLONASE) 50 MCG/ACT nasal spray Place 2 sprays into both nostrils daily.  16 g 5    glipiZIDE (GLUCOTROL) 10 MG tablet Take 1.5 tablets (15 mg total) by mouth 2 (two) times daily before a meal. 270 tablet 3    glucose blood test strip Use to test blood sugar 2 (two) times daily. 100 strip 3    hydrocortisone-pramoxine (ANALPRAM-HC) 2.5-1 % rectal cream Place rectally 3 (three) times daily for 10 days 30 g 0    lisinopril (ZESTRIL) 30 MG tablet TAKE 1 TABLET BY MOUTH ONCE DAILY 90 tablet 3    loratadine (CLARITIN) 10 MG tablet Take 10 mg by mouth daily as needed.      METAMUCIL FIBER PO Take by mouth in the morning and at bedtime.      metFORMIN (GLUCOPHAGE-XR) 750 MG 24 hr tablet Take 1 tablet (750 mg total) by mouth daily before lunch. 90 tablet 3    Multiple Vitamin (MULTIVITAMIN WITH MINERALS) TABS tablet Take 1 tablet by mouth daily.      Na Sulfate-K Sulfate-Mg Sulf 17.5-3.13-1.6 GM/177ML SOLN Use as directed by MD 354 mL 0    tirzepatide (MOUNJARO) 10 MG/0.5ML Pen Inject 10 mg into the skin once a week. 6 mL 3    triamterene-hydrochlorothiazide (MAXZIDE-25) 37.5-25 MG tablet Take 1 tablet by mouth daily. 90 tablet 1    Vitamin D, Ergocalciferol, (DRISDOL) 1.25 MG (50000 UNIT) CAPS capsule Take 1 capsule (50,000 Units total) by mouth every 7 (seven) days. 12 capsule 0      Allergies  Allergen Reactions   Trulicity [Dulaglutide]  Itching    Rash/itching at injection site     Past Medical History:  Diagnosis Date   Colon polyp    Diabetes mellitus    Elevated cholesterol    Hypertension     Review of systems:  Otherwise negative.    Physical Exam  Gen: Alert, oriented. Appears stated age.  HEENT: [place]/AT. PERRLA. Lungs: CTA, no wheezes. CV: RR nl S1, S2. Abd: soft, benign, no masses. BS+ Ext: No edema. Pulses 2+    Planned procedures: Proceed with colonoscopy. The patient understands the nature of the planned procedure, indications, risks, alternatives and potential complications including but not limited to bleeding, infection, perforation,  damage to internal organs and possible oversedation/side effects from anesthesia. The patient agrees and gives consent to proceed.  Please refer to procedure notes for findings, recommendations and patient disposition/instructions.     Fortune Brannigan K. Norma Fredrickson, M.D. Gastroenterology 09/15/2022  9:43 AM

## 2022-09-15 NOTE — Anesthesia Postprocedure Evaluation (Signed)
Anesthesia Post Note  Patient: Laura Marsh  Procedure(s) Performed: COLONOSCOPY  Patient location during evaluation: PACU Anesthesia Type: General Level of consciousness: awake and alert, oriented and patient cooperative Pain management: pain level controlled Vital Signs Assessment: post-procedure vital signs reviewed and stable Respiratory status: spontaneous breathing, nonlabored ventilation and respiratory function stable Cardiovascular status: blood pressure returned to baseline and stable Postop Assessment: adequate PO intake Anesthetic complications: no   No notable events documented.   Last Vitals:  Vitals:   09/15/22 1156 09/15/22 1206  BP: 114/69 102/80  Pulse: 87 100  Resp: 16 (!) 26  Temp:    SpO2: 97% 100%    Last Pain:  Vitals:   09/15/22 1206  TempSrc:   PainSc: 0-No pain                 Reed Breech

## 2022-09-15 NOTE — Op Note (Signed)
[facility] Gastroenterology Patient Name: Laura Marsh Procedure Date: 09/15/2022 10:19 AM MRN: [id number] Account #: [id number] Date of Birth: 1955/05/13 Admit Type: Outpatient Age: 67 Room: [facility] ENDO ROOM 2 Gender: Female Note Status: Finalized Instrument Name: Prentice Docker [id number] Procedure:             Colonoscopy Indications:           Excision of colonic polyp, . Patient had colonoscopy                         in Jan 2022 from Dr. Krista Blue polypoid lesion not                         removed, but biopsied revealing benign tubular adenoma                         histology. Providers:             Boykin Nearing. Norma Fredrickson MD, MD Referring MD:          Deeann Saint (Referring MD) Medicines:             Propofol per Anesthesia Complications:         No immediate complications. Estimated blood loss:                         Minimal. Procedure:             Pre-Anesthesia Assessment:                        - The risks and benefits of the procedure and the                         sedation options and risks were discussed with the                         patient. All questions were answered and informed                         consent was obtained.                        - Patient identification and proposed procedure were                         verified prior to the procedure by the nurse. The                         procedure was verified in the procedure room.                        - ASA Grade Assessment: III - A patient with severe                         systemic disease.                        - After reviewing the risks and benefits, the patient  was deemed in satisfactory condition to undergo the                         procedure.                        After obtaining informed consent, the colonoscope was                         passed under direct vision. Throughout the procedure,                         the patient's blood  pressure, pulse, and oxygen                         saturations were monitored continuously. The                         Colonoscope was introduced through the anus and                         advanced to the the cecum, identified by appendiceal                         orifice and ileocecal valve. The colonoscopy was                         performed without difficulty. The patient tolerated                         the procedure well. The quality of the bowel                         preparation was adequate. The ileocecal valve,                         appendiceal orifice, and rectum were photographed. Findings:      The perianal and digital rectal examinations were normal. Pertinent       negatives include normal sphincter tone and no palpable rectal lesions.      Non-bleeding internal hemorrhoids were found during retroflexion. The       hemorrhoids were Grade I (internal hemorrhoids that do not prolapse).      A 43 mm polypoid lesion was found in the distal rectum. The lesion was       multi-lobulated. No bleeding was present. Area was successfully injected       with 9 mL Eleview for lesion assessment, and this injection appeared to       lift the lesion adequately. The polyp was removed with a piecemeal       technique using a hot snare. Polyp resection was incomplete. The       resected tissue was retrieved. Coagulation for hemostasis of bleeding       caused by the procedure using argon plasma at 0.8 liters/minute and 20       watts was unsuccessful. 10 cc of "Purastat" gel applied to residual       bleeding spots after polypectomy; hemostasis achieved. Estimated blood       loss was minimal.      A tattoo was seen in the  distal rectum. Polypoid lesion noted in this       area as above.      The exam was otherwise without abnormality. Impression:            - Non-bleeding internal hemorrhoids.                        - Likely benign polypoid lesion in the distal rectum.                          Tissue was removed. Injected. Treatment not                         successful. Treated with argon plasma coagulation                         (APC).                        - A tattoo was seen in the distal rectum. Polypoid                         lesion noted in this area as above.                        - The examination was otherwise normal. Recommendation:        - Patient has a contact number available for                         emergencies. The signs and symptoms of potential                         delayed complications were discussed with the patient.                         Return to normal activities tomorrow. Written                         discharge instructions were provided to the patient.                        - Resume previous diet.                        - Continue present medications.                        - No aspirin, ibuprofen, naproxen, or other                         non-steroidal anti-inflammatory drugs for 2 weeks.                        - Repeat colonoscopy for surveillance based on                         clinical status at that time.                        - Return to GI office in 4 months.                        -  The findings and recommendations were discussed with                         the patient. Procedure Code(s):     --- Professional ---                        [phone], Colonoscopy, flexible; with removal of                         tumor(s), polyp(s), or other lesion(s) by snare                         technique                        [id number], Colonoscopy, flexible; with directed submucosal                         injection(s), any substance Diagnosis Code(s):     --- Professional ---                        K63.5, Polyp of colon                        K64.0, First degree hemorrhoids                        D49.0, Neoplasm of unspecified behavior of digestive                         system CPT copyright 2022 American Medical Association. All  rights reserved. The codes documented in this report are preliminary and upon coder review may  be revised to meet current compliance requirements. Stanton Kidney MD, MD 09/15/2022 11:41:40 AM This report has been signed electronically. Number of Addenda: 0 Note Initiated On: 09/15/2022 10:19 AM Scope Withdrawal Time: 0 hours 50 minutes 37 seconds  Total Procedure Duration: 0 hours 56 minutes 14 seconds  Estimated Blood Loss:  Estimated blood loss was minimal.      [facility]

## 2022-09-15 NOTE — Transfer of Care (Signed)
Immediate Anesthesia Transfer of Care Note  Patient: Laura Marsh  Procedure(s) Performed: COLONOSCOPY  Patient Location: PACU and Endoscopy Unit  Anesthesia Type:MAC  Level of Consciousness: drowsy  Airway & Oxygen Therapy: Patient Spontanous Breathing and Patient connected to face mask oxygen  Post-op Assessment: Report given to RN and Post -op Vital signs reviewed and stable  Post vital signs: Reviewed and stable  Last Vitals:  Vitals Value Taken Time  BP 114/69 09/15/22 1135  Temp    Pulse 84 09/15/22 1135  Resp 20 09/15/22 1135  SpO2 100 % 09/15/22 1135  Vitals shown include unvalidated device data.  Last Pain:  Vitals:   09/15/22 1003  TempSrc: Temporal  PainSc: 0-No pain         Complications: No notable events documented.

## 2022-09-16 ENCOUNTER — Encounter: Payer: Self-pay | Admitting: Internal Medicine

## 2022-09-22 ENCOUNTER — Other Ambulatory Visit (HOSPITAL_COMMUNITY): Payer: Self-pay

## 2022-10-15 ENCOUNTER — Other Ambulatory Visit (HOSPITAL_COMMUNITY): Payer: Self-pay

## 2022-10-18 ENCOUNTER — Other Ambulatory Visit (HOSPITAL_COMMUNITY): Payer: Self-pay

## 2022-10-19 ENCOUNTER — Other Ambulatory Visit (HOSPITAL_COMMUNITY): Payer: Self-pay

## 2022-10-29 IMAGING — DX DG CHEST 2V
2 series · 2 of 2 positions shown · non-contrast
Comparison: May 04, 2011

CLINICAL DATA: Shortness of breath and cough for 4 days.

EXAM:
CHEST - 2 VIEW

[chest pa]
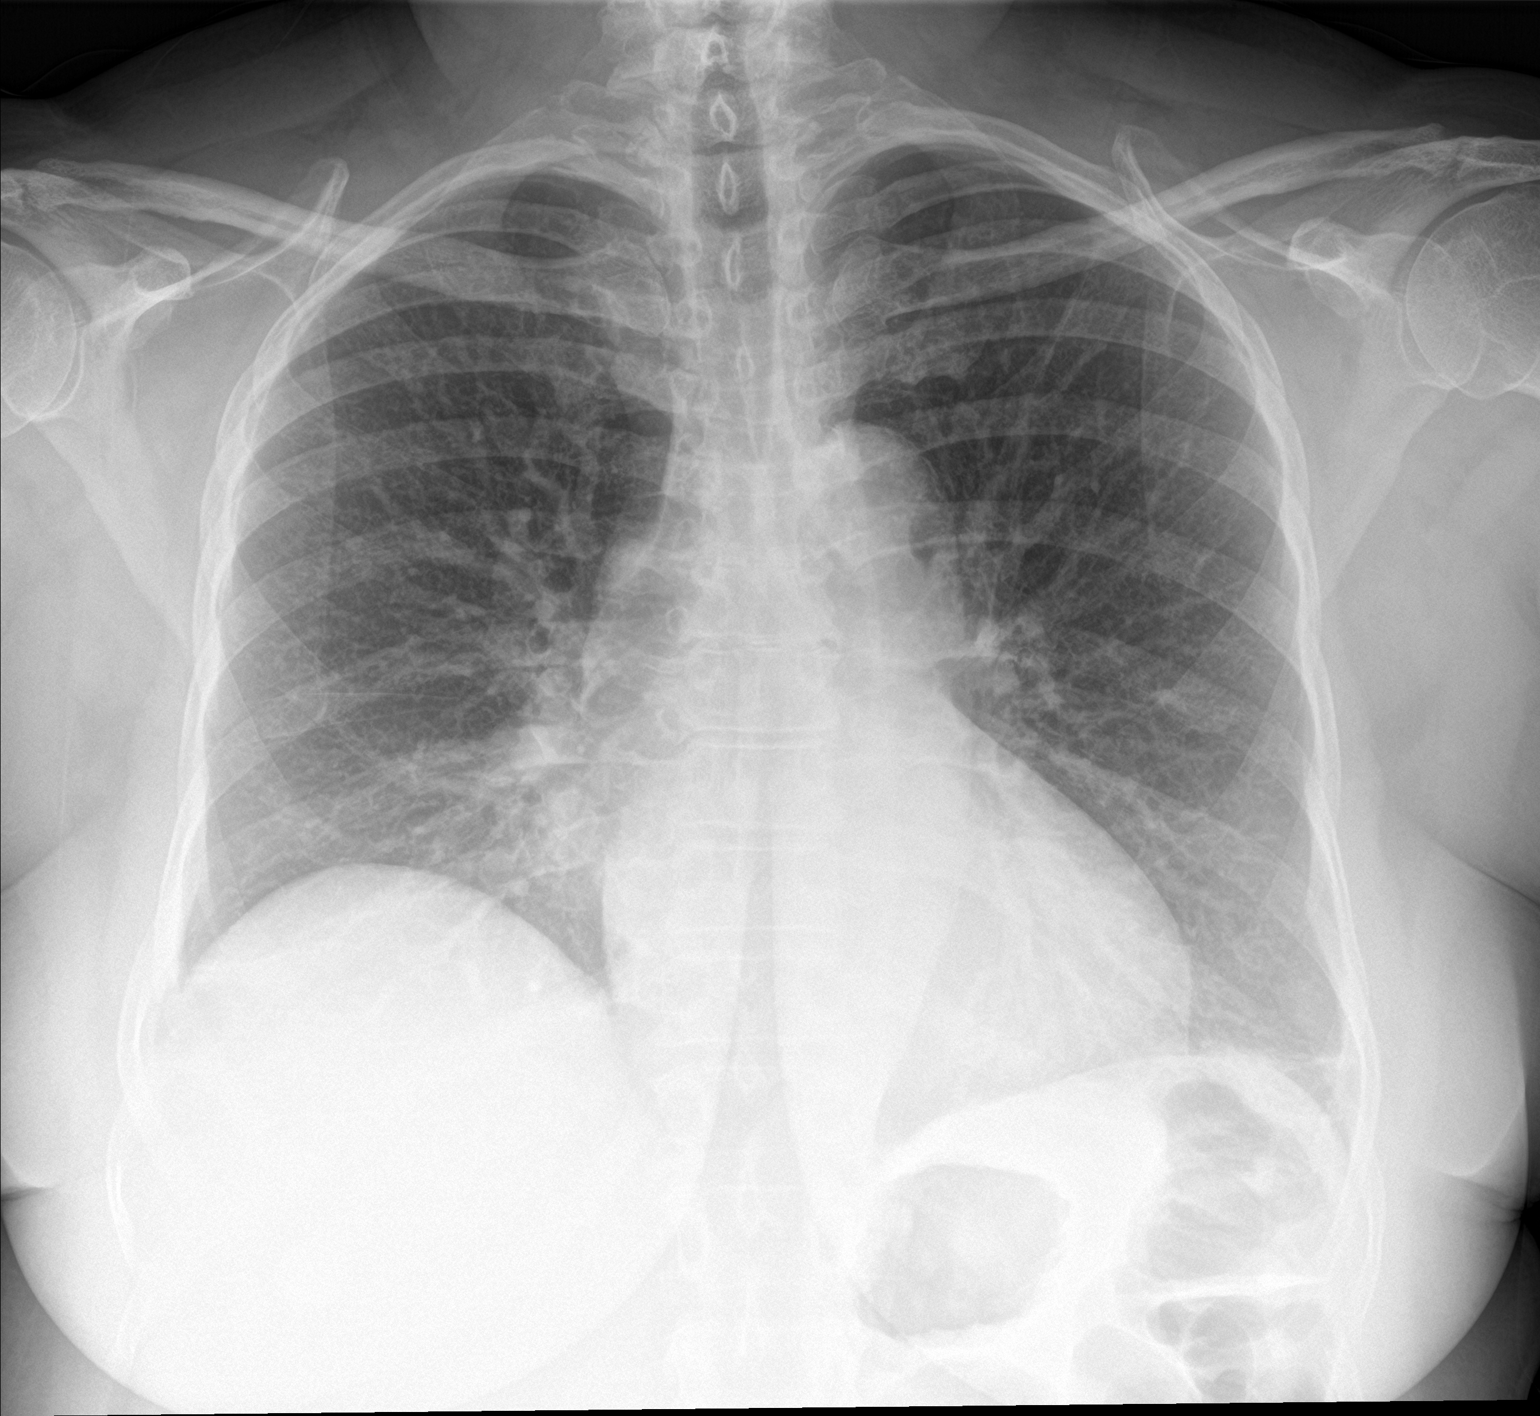

[chest lat]
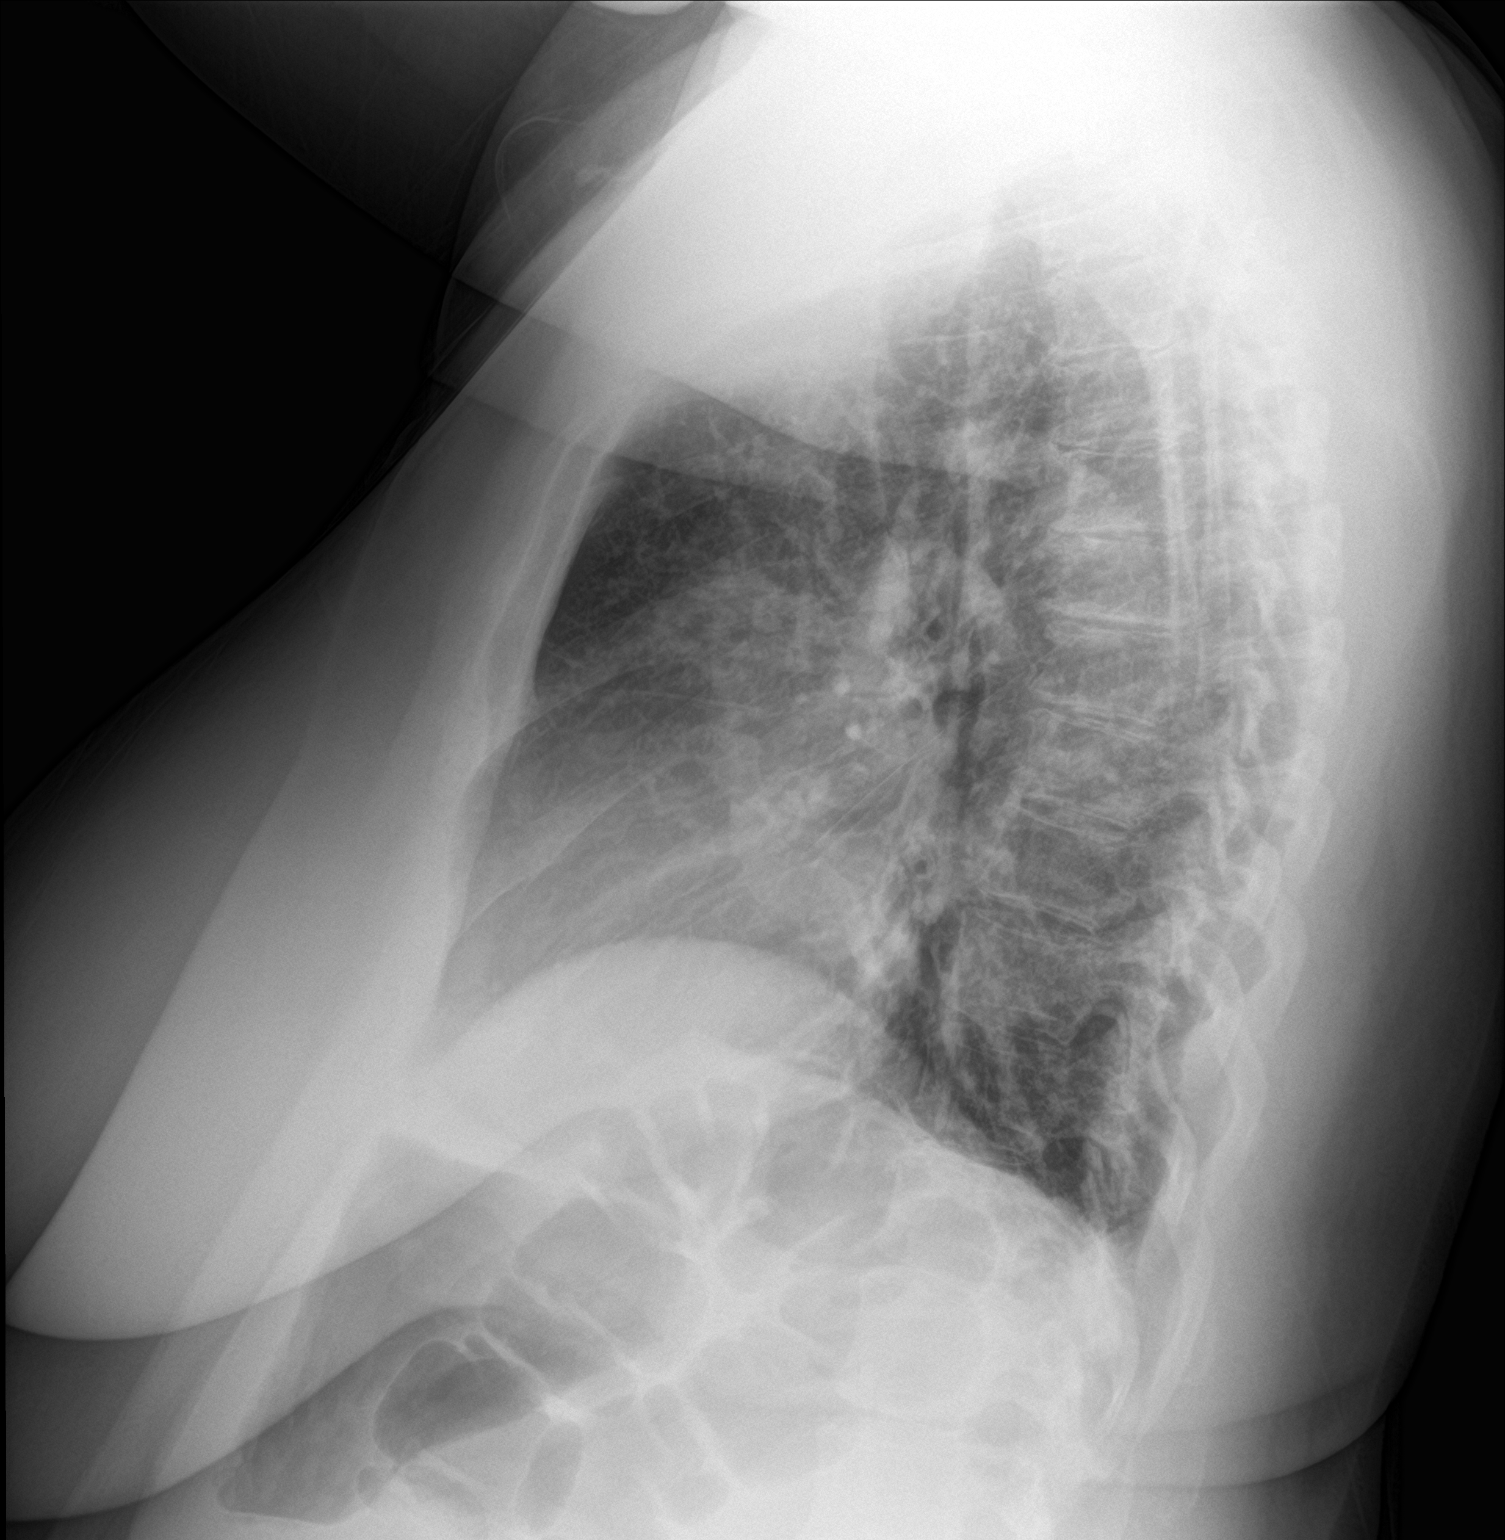

[2 of 2 positions shown; findings below may reference images not displayed]

FINDINGS: The heart size and mediastinal contours are within normal limits.
Minimal atelectasis of lateral left base is noted. Both lungs are
otherwise clear. The visualized skeletal structures are
unremarkable.
IMPRESSION: No active cardiopulmonary disease.

## 2022-11-08 ENCOUNTER — Ambulatory Visit: Payer: Commercial Managed Care - PPO | Admitting: Internal Medicine

## 2022-11-15 LAB — HM PAP SMEAR: HM Pap smear: NORMAL

## 2022-11-16 ENCOUNTER — Other Ambulatory Visit (HOSPITAL_COMMUNITY): Payer: Self-pay

## 2022-11-26 ENCOUNTER — Ambulatory Visit (INDEPENDENT_AMBULATORY_CARE_PROVIDER_SITE_OTHER): Payer: Commercial Managed Care - PPO | Admitting: Family Medicine

## 2022-11-26 ENCOUNTER — Other Ambulatory Visit (HOSPITAL_COMMUNITY): Payer: Self-pay

## 2022-11-26 ENCOUNTER — Encounter: Payer: Self-pay | Admitting: Family Medicine

## 2022-11-26 VITALS — BP 136/84 | HR 94 | Temp 98.1°F | Ht 63.0 in | Wt 155.6 lb

## 2022-11-26 DIAGNOSIS — E1169 Type 2 diabetes mellitus with other specified complication: Secondary | ICD-10-CM

## 2022-11-26 DIAGNOSIS — Z23 Encounter for immunization: Secondary | ICD-10-CM

## 2022-11-26 DIAGNOSIS — M7631 Iliotibial band syndrome, right leg: Secondary | ICD-10-CM | POA: Diagnosis not present

## 2022-11-26 DIAGNOSIS — N898 Other specified noninflammatory disorders of vagina: Secondary | ICD-10-CM | POA: Diagnosis not present

## 2022-11-26 DIAGNOSIS — Z0000 Encounter for general adult medical examination without abnormal findings: Secondary | ICD-10-CM | POA: Diagnosis not present

## 2022-11-26 DIAGNOSIS — L292 Pruritus vulvae: Secondary | ICD-10-CM | POA: Diagnosis not present

## 2022-11-26 DIAGNOSIS — Z7984 Long term (current) use of oral hypoglycemic drugs: Secondary | ICD-10-CM

## 2022-11-26 DIAGNOSIS — I10 Essential (primary) hypertension: Secondary | ICD-10-CM | POA: Diagnosis not present

## 2022-11-26 DIAGNOSIS — Z7985 Long-term (current) use of injectable non-insulin antidiabetic drugs: Secondary | ICD-10-CM | POA: Diagnosis not present

## 2022-11-26 DIAGNOSIS — Z1231 Encounter for screening mammogram for malignant neoplasm of breast: Secondary | ICD-10-CM | POA: Diagnosis not present

## 2022-11-26 DIAGNOSIS — Z01419 Encounter for gynecological examination (general) (routine) without abnormal findings: Secondary | ICD-10-CM | POA: Diagnosis not present

## 2022-11-26 DIAGNOSIS — E782 Mixed hyperlipidemia: Secondary | ICD-10-CM | POA: Diagnosis not present

## 2022-11-26 LAB — COMPREHENSIVE METABOLIC PANEL
ALT: 15 U/L (ref 0–35)
AST: 15 U/L (ref 0–37)
Albumin: 4.2 g/dL (ref 3.5–5.2)
Alkaline Phosphatase: 105 U/L (ref 39–117)
BUN: 15 mg/dL (ref 6–23)
CO2: 30 mEq/L (ref 19–32)
Calcium: 10.2 mg/dL (ref 8.4–10.5)
Chloride: 103 mEq/L (ref 96–112)
Creatinine, Ser: 0.84 mg/dL (ref 0.40–1.20)
GFR: 73.17 mL/min (ref >60.00)
Glucose, Bld: 146 mg/dL — ABNORMAL HIGH (ref 70–99)
Potassium: 3.8 mEq/L (ref 3.5–5.1)
Sodium: 141 mEq/L (ref 135–145)
Total Bilirubin: 0.7 mg/dL (ref 0.2–1.2)
Total Protein: 6.7 g/dL (ref 6.0–8.3)

## 2022-11-26 LAB — CBC WITH DIFFERENTIAL/PLATELET
Basophils Absolute: 0.1 10*3/uL (ref 0.0–0.1)
Basophils Relative: 0.9 % (ref 0.0–3.0)
Eosinophils Absolute: 0.3 10*3/uL (ref 0.0–0.7)
Eosinophils Relative: 4.7 % (ref 0.0–5.0)
HCT: 37.8 % (ref 36.0–46.0)
Hemoglobin: 11.7 g/dL — ABNORMAL LOW (ref 12.0–15.0)
Lymphocytes Relative: 40.2 % (ref 12.0–46.0)
Lymphs Abs: 2.6 10*3/uL (ref 0.7–4.0)
MCHC: 30.9 g/dL (ref 30.0–36.0)
MCV: 84 fl (ref 78.0–100.0)
Monocytes Absolute: 0.3 10*3/uL (ref 0.1–1.0)
Monocytes Relative: 4.4 % (ref 3.0–12.0)
Neutro Abs: 3.2 10*3/uL (ref 1.4–7.7)
Neutrophils Relative %: 49.8 % (ref 43.0–77.0)
Platelets: 338 10*3/uL (ref 150.0–400.0)
RBC: 4.5 Mil/uL (ref 3.87–5.11)
RDW: 13.7 % (ref 11.5–15.5)
WBC: 6.4 10*3/uL (ref 4.0–10.5)

## 2022-11-26 LAB — LIPID PANEL
Cholesterol: 167 mg/dL (ref 0–200)
HDL: 49.4 mg/dL (ref >39.00)
LDL Cholesterol: 101 mg/dL — ABNORMAL HIGH (ref 0–99)
NonHDL: 117.88
Total CHOL/HDL Ratio: 3
Triglycerides: 84 mg/dL (ref 0.0–149.0)
VLDL: 16.8 mg/dL (ref 0.0–40.0)

## 2022-11-26 LAB — HEMOGLOBIN A1C: Hgb A1c MFr Bld: 8.7 % — ABNORMAL HIGH (ref 4.6–6.5)

## 2022-11-26 LAB — T4, FREE: Free T4: 1.03 ng/dL (ref 0.60–1.60)

## 2022-11-26 LAB — HM MAMMOGRAPHY

## 2022-11-26 LAB — MICROALBUMIN / CREATININE URINE RATIO
Creatinine,U: 117 mg/dL
Microalb Creat Ratio: 1.3 mg/g (ref 0.0–30.0)
Microalb, Ur: 1.6 mg/dL (ref 0.0–1.9)

## 2022-11-26 LAB — TSH: TSH: 1.31 u[IU]/mL (ref 0.35–5.50)

## 2022-11-26 MED ORDER — NYSTATIN-TRIAMCINOLONE 100000-0.1 UNIT/GM-% EX OINT
1.0000 | TOPICAL_OINTMENT | Freq: Two times a day (BID) | CUTANEOUS | 1 refills | Status: DC
  Filled 2022-11-26: qty 30, 15d supply, fill #0
  Filled 2023-02-23 – 2023-05-06 (×3): qty 30, 15d supply, fill #1

## 2022-11-26 MED ORDER — TRIAMTERENE-HCTZ 37.5-25 MG PO TABS
1.0000 | ORAL_TABLET | Freq: Every day | ORAL | 3 refills | Status: DC
  Filled 2022-11-26: qty 90, 90d supply, fill #0
  Filled 2022-12-10 – 2023-05-24 (×4): qty 90, 90d supply, fill #1
  Filled 2023-09-03: qty 90, 90d supply, fill #2

## 2022-11-26 MED ORDER — AMLODIPINE BESYLATE 5 MG PO TABS
5.0000 mg | ORAL_TABLET | Freq: Every day | ORAL | 3 refills | Status: DC
Start: 2022-11-26 — End: 2023-12-30
  Filled 2022-11-26 – 2023-02-05 (×3): qty 90, 90d supply, fill #0
  Filled 2023-04-30: qty 90, 90d supply, fill #1
  Filled 2023-06-24 – 2023-07-16 (×2): qty 90, 90d supply, fill #2
  Filled 2023-10-28: qty 90, 90d supply, fill #3

## 2022-11-26 MED ORDER — ATORVASTATIN CALCIUM 20 MG PO TABS
20.0000 mg | ORAL_TABLET | Freq: Every day | ORAL | 3 refills | Status: DC
Start: 2022-11-26 — End: 2023-11-28
  Filled 2022-11-26: qty 90, 90d supply, fill #0
  Filled 2023-02-23 – 2023-05-24 (×3): qty 90, 90d supply, fill #1
  Filled 2023-09-03: qty 90, 90d supply, fill #2

## 2022-11-26 MED ORDER — LISINOPRIL 30 MG PO TABS
30.0000 mg | ORAL_TABLET | Freq: Every day | ORAL | 3 refills | Status: DC
Start: 2022-11-26 — End: 2023-12-30
  Filled 2022-11-26 – 2022-12-22 (×3): qty 90, 90d supply, fill #0
  Filled 2023-03-19 (×2): qty 90, 90d supply, fill #1
  Filled 2023-06-24: qty 90, 90d supply, fill #2
  Filled 2023-09-27: qty 90, 90d supply, fill #3

## 2022-11-26 NOTE — Progress Notes (Signed)
Established Patient Office Visit   Subjective  Patient ID: Laura Marsh, female    DOB: 05/05/1955  Age: 67 y.o. MRN: [id number]  Chief Complaint  Patient presents with   Annual Exam    Pt here for Annual Exam and is currently fasting     Patient is a 67 year old female seen for CPE.  Patient states she has been doing well overall.  Having right lateral hip/thigh pain.  Previously seen in office by another provider.  Diagnosed with bursitis.  Symptoms have continued and occur more with frequent bending.  At times difficult to get into car.  Rx for diclofenac did not help.  Tylenol helps.  Patient has noticed improvement in LE edema since being less sedentary at work.  Patient interested in Tdap and shingles vaccine.  Has mammogram later today.  Eye exam coming up in the next few weeks.  Had colonoscopy 09/15/2022.    Past Medical History:  Diagnosis Date   Colon polyp    Diabetes mellitus    Elevated cholesterol    Hypertension    Past Surgical History:  Procedure Laterality Date   ABDOMINAL HYSTERECTOMY     COLONOSCOPY     COLONOSCOPY N/A 09/15/2022   Procedure: COLONOSCOPY;  Surgeon: Toledo, Boykin Nearing, MD;  Location: [facility];  Service: Gastroenterology;  Laterality: N/A;   COLONOSCOPY WITH PROPOFOL N/A 11/08/2017   Procedure: COLONOSCOPY WITH PROPOFOL;  Surgeon: Toledo, Boykin Nearing, MD;  Location: [facility];  Service: Gastroenterology;  Laterality: N/A;   COLONOSCOPY WITH PROPOFOL N/A 04/28/2020   Procedure: COLONOSCOPY WITH PROPOFOL;  Surgeon: Sung Amabile, DO;  Location: [facility];  Service: General;  Laterality: N/A;   Social History   Tobacco Use   Smoking status: Never   Smokeless tobacco: Never  Vaping Use   Vaping status: Never Used  Substance Use Topics   Alcohol use: Yes    Comment: rarely   Drug use: No   Family History  Problem Relation Age of Onset   Diabetes Mother    Congestive Heart Failure Mother    Allergies  Allergen Reactions    Trulicity [Dulaglutide] Itching    Rash/itching at injection site      ROS Negative unless stated above    Objective:     BP 136/84   Pulse 94   Temp 98.1 F (36.7 C)   Ht 5\' 3"  (1.6 m)   Wt 155 lb 9.6 oz (70.6 kg)   SpO2 97%   BMI 27.56 kg/m  BP Readings from Last 3 Encounters:  11/26/22 136/84  09/15/22 102/80  06/18/22 124/80   Wt Readings from Last 3 Encounters:  11/26/22 155 lb 9.6 oz (70.6 kg)  09/15/22 196 lb 9.6 oz (89.2 kg)  06/18/22 199 lb 6.4 oz (90.4 kg)      Physical Exam Constitutional:      Appearance: Normal appearance.  HENT:     Head: Normocephalic and atraumatic.     Right Ear: Tympanic membrane, ear canal and external ear normal.     Left Ear: Tympanic membrane, ear canal and external ear normal.     Nose: Nose normal.     Mouth/Throat:     Mouth: Mucous membranes are moist.     Pharynx: No oropharyngeal exudate or posterior oropharyngeal erythema.  Eyes:     General: No scleral icterus.    Extraocular Movements: Extraocular movements intact.     Conjunctiva/sclera: Conjunctivae normal.     Pupils: Pupils are equal, round, and  reactive to light.  Neck:     Thyroid: No thyromegaly.  Cardiovascular:     Rate and Rhythm: Normal rate and regular rhythm.     Pulses: Normal pulses.     Heart sounds: Normal heart sounds. No murmur heard.    No friction rub.  Pulmonary:     Effort: Pulmonary effort is normal.     Breath sounds: Normal breath sounds. No wheezing, rhonchi or rales.  Abdominal:     General: Bowel sounds are normal.     Palpations: Abdomen is soft.     Tenderness: There is no abdominal tenderness.  Musculoskeletal:        General: Tenderness present. No deformity. Normal range of motion.     Right hip: Tenderness present.     Right upper leg: Tenderness present.     Right lower leg: Edema present.     Left lower leg: Edema present.     Comments: TTP of lateral R hip down to knee  Lymphadenopathy:     Cervical: No cervical  adenopathy.  Skin:    General: Skin is warm and dry.     Findings: No lesion.  Neurological:     General: No focal deficit present.     Mental Status: She is alert and oriented to person, place, and time.  Psychiatric:        Mood and Affect: Mood normal.        Thought Content: Thought content normal.    No results found for any visits on 11/26/22.    Assessment & Plan:  Well adult exam -Age-appropriate health screenings discussed -Will obtain labs. -Immunizations reviewed.  Shingles and Tdap given this visit -Colonoscopy up-to-date done 09/15/2022 -Mammogram scheduled for later today. -Pap with OB/GYN -Next CPE in 1 year  Need for Tdap vaccination -     Tdap vaccine greater than or equal to 7yo IM  Need for shingles vaccine -     Varicella-zoster vaccine IM  Type 2 diabetes mellitus with other specified complication, without long-term current use of insulin (HCC) -Hemoglobin A1c 9.7% on 06/18/2022 -Continue current medications including Mounjaro 10 mg weekly, metformin XR 750 mg daily, glipizide 15 mg twice daily -Continue ACE I and statin -Eye exam scheduled -Foot exam up-to-date, done 06/18/2022 -     Microalbumin / creatinine urine ratio -     Hemoglobin A1c  Mixed hyperlipidemia -Continue Lipitor 20 mg daily -     Comprehensive metabolic panel -     Lipid panel -     Atorvastatin Calcium; Take 1 tablet  by mouth daily.  Dispense: 90 tablet; Refill: 3  Essential hypertension -Controlled -Continue current medications including Norvasc 5 mg daily, triamterene-hydrochlorothiazide 37.5-25 mg daily, lisinopril 30 mg daily. -Continue lifestyle modifications -     Comprehensive metabolic panel -     CBC with Differential/Platelet -     TSH -     T4, free -     amLODIPine Besylate; Take 1 tablet (5 mg total) by mouth daily.  Dispense: 90 tablet; Refill: 3 -     Lisinopril; TAKE 1 TABLET BY MOUTH ONCE DAILY  Dispense: 90 tablet; Refill: 3 -     Triamterene-HCTZ; Take  1 tablet by mouth daily.  Dispense: 90 tablet; Refill: 3  Iliotibial band syndrome of right side -Discussed supportive care including Tylenol, NSAIDs, stretching, heat, massage, etc. -Continue wearing supportive shoes -For continued or worsening symptoms consider PT.   Return if symptoms worsen or fail to  improve.   Deeann Saint, MD

## 2022-11-30 ENCOUNTER — Other Ambulatory Visit (HOSPITAL_COMMUNITY): Payer: Self-pay

## 2022-12-11 ENCOUNTER — Other Ambulatory Visit (HOSPITAL_COMMUNITY): Payer: Self-pay

## 2022-12-15 ENCOUNTER — Ambulatory Visit: Payer: Commercial Managed Care - PPO | Admitting: Internal Medicine

## 2022-12-21 ENCOUNTER — Other Ambulatory Visit (HOSPITAL_COMMUNITY): Payer: Self-pay

## 2022-12-22 ENCOUNTER — Other Ambulatory Visit (HOSPITAL_COMMUNITY): Payer: Self-pay

## 2022-12-24 ENCOUNTER — Other Ambulatory Visit (HOSPITAL_COMMUNITY): Payer: Self-pay

## 2022-12-31 ENCOUNTER — Encounter: Payer: Self-pay | Admitting: Internal Medicine

## 2022-12-31 ENCOUNTER — Ambulatory Visit: Payer: Commercial Managed Care - PPO | Admitting: Internal Medicine

## 2022-12-31 ENCOUNTER — Other Ambulatory Visit (HOSPITAL_COMMUNITY): Payer: Self-pay

## 2022-12-31 VITALS — BP 130/90 | HR 102 | Ht 63.0 in | Wt 192.4 lb

## 2022-12-31 DIAGNOSIS — E1165 Type 2 diabetes mellitus with hyperglycemia: Secondary | ICD-10-CM | POA: Diagnosis not present

## 2022-12-31 DIAGNOSIS — Z7984 Long term (current) use of oral hypoglycemic drugs: Secondary | ICD-10-CM | POA: Diagnosis not present

## 2022-12-31 DIAGNOSIS — E785 Hyperlipidemia, unspecified: Secondary | ICD-10-CM | POA: Diagnosis not present

## 2022-12-31 MED ORDER — TIRZEPATIDE 12.5 MG/0.5ML ~~LOC~~ SOAJ
12.5000 mg | SUBCUTANEOUS | 3 refills | Status: DC
  Filled 2022-12-31 – 2023-01-03 (×2): qty 2, 28d supply, fill #0
  Filled 2023-02-05: qty 2, 28d supply, fill #1
  Filled 2023-02-23 – 2023-02-28 (×2): qty 2, 28d supply, fill #2
  Filled 2023-04-02: qty 2, 28d supply, fill #3
  Filled 2023-04-30: qty 2, 28d supply, fill #4
  Filled 2023-05-24: qty 2, 28d supply, fill #5

## 2022-12-31 MED ORDER — GLIPIZIDE ER 10 MG PO TB24
10.0000 mg | ORAL_TABLET | Freq: Every day | ORAL | 3 refills | Status: DC
  Filled 2022-12-31: qty 90, 90d supply, fill #0
  Filled 2023-05-24: qty 90, 90d supply, fill #1

## 2022-12-31 MED ORDER — METFORMIN HCL ER 750 MG PO TB24
750.0000 mg | ORAL_TABLET | Freq: Every day | ORAL | 3 refills | Status: DC
  Filled 2022-12-31 – 2023-03-12 (×3): qty 90, 90d supply, fill #0

## 2022-12-31 NOTE — Patient Instructions (Signed)
-   Continue Metformin 750 mg XR, 1 tablet daily - Change glipizide 10 mg XL, 1 tablet daily - Increase Mounjaro 12.5 mg weekly    HOW TO TREAT LOW BLOOD SUGARS (Blood sugar LESS THAN 70 MG/DL) Please follow the RULE OF 15 for the treatment of hypoglycemia treatment (when your (blood sugars are less than 70 mg/dL)   STEP 1: Take 15 grams of carbohydrates when your blood sugar is low, which includes:  3-4 GLUCOSE TABS  OR 3-4 OZ OF JUICE OR REGULAR SODA OR ONE TUBE OF GLUCOSE GEL    STEP 2: RECHECK blood sugar in 15 MINUTES STEP 3: If your blood sugar is still low at the 15 minute recheck --> then, go back to STEP 1 and treat AGAIN with another 15 grams of carbohydrates.

## 2022-12-31 NOTE — Progress Notes (Signed)
Name: Laura Marsh  Age/ Sex: 67 y.o., female   MRN/ DOB: [id number], October 25, 1955     PCP: Deeann Saint, MD   Reason for Endocrinology Evaluation: Type 2 Diabetes Mellitus     Initial Endocrinology Clinic Visit: 06/16/2018    PATIENT IDENTIFIER: Laura Marsh is a 67 y.o. female with a past medical history of HTN, T2DM and Dyslipidemia. The patient has followed with Endocrinology clinic since 06/16/2018 for consultative assistance with management of her diabetes.  DIABETIC HISTORY:  Laura Marsh was diagnosed with T2DM ~ 2014. She has been on oral glycemic agents for years. She has never been on insulin therapy. Her hemoglobin A1c has ranged from 6.5% in 2014, peaking at 11.0% in 2019.   On her initial visit to our clinic she was on Metformin, Glipizide and Januvia, with an A1c 9.7 %.  We stopped Januvia and glipizide and started Ozempic with continuation of Metformin    Works [employer]  I have attempted to screen her for Cushing's syndrome due to spontaneous hypokalemia and weight gain in 06/2020 but she did not return the urine sample.    Stopped Marcelline Deist 01/2022 due to recurrent yeast infections SUBJECTIVE:   During the last visit (06/18/2022): A1c 9.7% .     Today (12/31/2022): Laura Marsh is here for a follow up on diabetes follow up.  She checks her blood sugars 1 tablet a week . The patient has not had hypoglycemic episodes since the last clinic visit.    Takes Glipizide mostly once a day rather than twice a day  She denies constipation unless  she takes Glipizide  Denies nausea or vomiting  Has occasional loose stools as well   She missed one dose of Mounjaro   HOME DIABETES REGIMEN:  Metformin 750 mg XR  at lunch  Glipizide 10 mg , 1.5 tablet BID Mounjaro 10 mg weekly ( Sundays)       GLUCOSE LOG  n/a   DIABETIC COMPLICATIONS: Microvascular complications:   Denies: CKD, retinopathy, neuropathy  Last eye  exam: Completed 02/03/2021   Macrovascular complications: Denies: CAD, PVD, CVA    HISTORY:  Past Medical History:  Past Medical History:  Diagnosis Date   Colon polyp    Diabetes mellitus    Elevated cholesterol    Hypertension    Past Surgical History:  Past Surgical History:  Procedure Laterality Date   ABDOMINAL HYSTERECTOMY     COLONOSCOPY     COLONOSCOPY N/A 09/15/2022   Procedure: COLONOSCOPY;  Surgeon: Toledo, Boykin Nearing, MD;  Location: [facility];  Service: Gastroenterology;  Laterality: N/A;   COLONOSCOPY WITH PROPOFOL N/A 11/08/2017   Procedure: COLONOSCOPY WITH PROPOFOL;  Surgeon: Toledo, Boykin Nearing, MD;  Location: [facility];  Service: Gastroenterology;  Laterality: N/A;   COLONOSCOPY WITH PROPOFOL N/A 04/28/2020   Procedure: COLONOSCOPY WITH PROPOFOL;  Surgeon: Sung Amabile, DO;  Location: [facility];  Service: General;  Laterality: N/A;   Social History:  reports that she has never smoked. She has never used smokeless tobacco. She reports current alcohol use. She reports that she does not use drugs. Family History:  Family History  Problem Relation Age of Onset   Diabetes Mother    Congestive Heart Failure Mother      HOME MEDICATIONS: Allergies as of 12/31/2022       Reactions   Trulicity [dulaglutide] Itching   Rash/itching at injection site  Medication List        Accurate as of December 31, 2022 11:52 AM. If you have any questions, ask your nurse or doctor.          albuterol 108 (90 Base) MCG/ACT inhaler Commonly known as: VENTOLIN HFA Inhale 1-2 puffs into the lungs every 6 (six) hours as needed for wheezing or shortness of breath.   Alcohol Swabs Pads Use as directed.   amLODipine 5 MG tablet Commonly known as: NORVASC Take 1 tablet (5 mg total) by mouth daily.   aspirin EC 81 MG tablet Take 81 mg by mouth daily. Swallow whole. Taken it twice a week   atorvastatin 20 MG tablet Commonly known as: LIPITOR Take 1  tablet  by mouth daily.   Dexcom G6 Sensor Misc Use as directed every 10 days (Use as directed every 10 days)   Dexcom G6 Transmitter Misc Use as directed (Use as directed)   fluticasone 50 MCG/ACT nasal spray Commonly known as: FLONASE Place 2 sprays into both nostrils daily.   FREESTYLE LITE test strip Generic drug: glucose blood Use to test blood sugar 2 (two) times daily.   glipiZIDE 10 MG tablet Commonly known as: GLUCOTROL Take 1.5 tablets (15 mg total) by mouth 2 (two) times daily before a meal.   lisinopril 30 MG tablet Commonly known as: ZESTRIL Take 1 tablet (30 mg total) by mouth daily.   loratadine 10 MG tablet Commonly known as: CLARITIN Take 10 mg by mouth daily as needed.   METAMUCIL FIBER PO Take by mouth in the morning and at bedtime.   metFORMIN 750 MG 24 hr tablet Commonly known as: GLUCOPHAGE-XR Take 1 tablet (750 mg total) by mouth daily before lunch.   Mounjaro 10 MG/0.5ML Pen Generic drug: tirzepatide Inject 10 mg into the skin once a week.   multivitamin with minerals Tabs tablet Take 1 tablet by mouth daily.   nystatin-triamcinolone ointment Commonly known as: MYCOLOG Apply 1 Application topically 2 (two) times daily.   sitaGLIPtin 100 MG tablet Commonly known as: JANUVIA Take 100 mg by mouth daily.   triamterene-hydrochlorothiazide 37.5-25 MG tablet Commonly known as: MAXZIDE-25 Take 1 tablet by mouth daily.   Vitamin D (Ergocalciferol) 1.25 MG (50000 UNIT) Caps capsule Commonly known as: DRISDOL Take 1 capsule (50,000 Units total) by mouth every 7 (seven) days.      PHYSICAL EXAM: VS: BP (!) 130/90   Pulse (!) 102   Ht 5\' 3"  (1.6 m)   Wt 192 lb 6.4 oz (87.3 kg)   SpO2 92%   BMI 34.08 kg/m    EXAM: General: Pt appears well and is in NAD  Lungs: Clear with good BS bilat   Heart: Auscultation: RRR    LE: Trace pitting edema   Mental Status: Judgment, insight: intact Mood and affect: no depression, anxiety, or  agitation    DM Foot exam 06/18/2022  The skin of the feet is intact without sores or ulcerations. The pedal pulses are 2+ on right and 2+ on left. The sensation is intact to a screening 5.07, 10 gram monofilament bilaterally    DATA REVIEWED:  Lab Results  Component Value Date   HGBA1C 8.7 (H) 11/26/2022   HGBA1C 9.7 (A) 06/18/2022   HGBA1C 8.5 (H) 12/18/2021      Latest Reference Range & Units 11/26/22 10:33  Sodium 135 - 145 mEq/L 141  Potassium 3.5 - 5.1 mEq/L 3.8  Chloride 96 - 112 mEq/L 103  CO2 19 - 32  mEq/L 30  Glucose 70 - 99 mg/dL 191 (H)  BUN 6 - 23 mg/dL 15  Creatinine 4.78 - 2.95 mg/dL 6.21  Calcium 8.4 - 30.8 mg/dL 65.7  Alkaline Phosphatase 39 - 117 U/L 105  Albumin 3.5 - 5.2 g/dL 4.2  AST 0 - 37 U/L 15  ALT 0 - 35 U/L 15  Total Protein 6.0 - 8.3 g/dL 6.7  Total Bilirubin 0.2 - 1.2 mg/dL 0.7  GFR >84.69 mL/min 73.17  Total CHOL/HDL Ratio  3  Cholesterol 0 - 200 mg/dL 629  HDL Cholesterol >52.84 mg/dL 13.24  LDL (calc) 0 - 99 mg/dL 401 (H)  MICROALB/CREAT RATIO 0.0 - 30.0 mg/g 1.3  NonHDL  117.88  Triglycerides 0.0 - 149.0 mg/dL 02.7  VLDL 0.0 - 25.3 mg/dL 66.4  (H): Data is abnormally high   Old records , labs and images have been reviewed.    ASSESSMENT / PLAN / RECOMMENDATIONS:   1) Type 2 Diabetes Mellitus, Poorly controlled, Without complications - Most recent A1c of 8.7 %. Goal A1c < 7.0 %.    -A1c has trended down from 9.7% to 8.7%, but continues to be above goal -Patient with imperfect adherence to glipizide, she has not been consistently taking the second dose of the day, I have recommended switching it to XR formulation, emphasized the importance of taking it before the meal and at the same time every day -Will increase Mounjaro     MEDICATIONS:   Change metformin 750 mg XR daily with lunch Change glipizide 10 mg XL,  1 tab daily  Increase Mounjaro 12.5 mg weekly       EDUCATION / INSTRUCTIONS: BG monitoring instructions:  Patient is instructed to check her blood sugars 2 times a day, fasting and bedtime. Call [facility] if: BG persistently < 70  I reviewed the Rule of 15 for the treatment of hypoglycemia in detail with the patient. Literature supplied.  2) Diabetic complications:  Eye: Does not have known diabetic retinopathy.  Neuro/ Feet: Does not have known diabetic peripheral neuropathy. Renal: Patient does not have known baseline CKD. She is  on an ACEI/ARB at present.  Micro albumin urea testing is normal and up-to-date  3) Dyslipidemia :   -LDL above goal -Discussed increased risk of cardiovascular events with out-of-control LDL -I have encouraged compliance with statin therapy, we also discussed the importance of following a low fat diet -If this remains elevated, will increase atorvastatin  Medication Continue atorvastatin 20 mg daily   F/U in  5 months    Signed electronically by: Lyndle Herrlich, MD  [facility]  [facility] [address] [place] [place], [place] [address] Phone: [phone] FAX: [phone]   CC: Deeann Saint, MD [address] [place] [address] Phone: [phone]  Fax: [phone]  Return to Endocrinology clinic as below: Future Appointments  Date Time Provider Department Center  03/18/2023  9:15 AM [facility]

## 2023-01-03 ENCOUNTER — Other Ambulatory Visit (HOSPITAL_COMMUNITY): Payer: Self-pay

## 2023-01-03 ENCOUNTER — Other Ambulatory Visit: Payer: Self-pay

## 2023-01-07 ENCOUNTER — Other Ambulatory Visit (HOSPITAL_COMMUNITY): Payer: Self-pay

## 2023-01-07 DIAGNOSIS — D3132 Benign neoplasm of left choroid: Secondary | ICD-10-CM | POA: Diagnosis not present

## 2023-01-07 DIAGNOSIS — H2513 Age-related nuclear cataract, bilateral: Secondary | ICD-10-CM | POA: Diagnosis not present

## 2023-01-07 DIAGNOSIS — H52223 Regular astigmatism, bilateral: Secondary | ICD-10-CM | POA: Diagnosis not present

## 2023-01-07 DIAGNOSIS — H524 Presbyopia: Secondary | ICD-10-CM | POA: Diagnosis not present

## 2023-01-07 DIAGNOSIS — H04123 Dry eye syndrome of bilateral lacrimal glands: Secondary | ICD-10-CM | POA: Diagnosis not present

## 2023-01-07 DIAGNOSIS — E119 Type 2 diabetes mellitus without complications: Secondary | ICD-10-CM | POA: Diagnosis not present

## 2023-02-05 ENCOUNTER — Other Ambulatory Visit (HOSPITAL_COMMUNITY): Payer: Self-pay

## 2023-02-24 ENCOUNTER — Other Ambulatory Visit (HOSPITAL_COMMUNITY): Payer: Self-pay

## 2023-02-24 ENCOUNTER — Other Ambulatory Visit: Payer: Self-pay

## 2023-03-04 ENCOUNTER — Other Ambulatory Visit (HOSPITAL_COMMUNITY): Payer: Self-pay

## 2023-03-05 ENCOUNTER — Other Ambulatory Visit (HOSPITAL_COMMUNITY): Payer: Self-pay

## 2023-03-05 ENCOUNTER — Other Ambulatory Visit (HOSPITAL_BASED_OUTPATIENT_CLINIC_OR_DEPARTMENT_OTHER): Payer: Self-pay

## 2023-03-12 ENCOUNTER — Other Ambulatory Visit (HOSPITAL_COMMUNITY): Payer: Self-pay

## 2023-03-17 DIAGNOSIS — D128 Benign neoplasm of rectum: Secondary | ICD-10-CM | POA: Diagnosis not present

## 2023-03-18 ENCOUNTER — Ambulatory Visit (INDEPENDENT_AMBULATORY_CARE_PROVIDER_SITE_OTHER): Payer: Commercial Managed Care - PPO

## 2023-03-18 DIAGNOSIS — Z23 Encounter for immunization: Secondary | ICD-10-CM

## 2023-03-19 ENCOUNTER — Other Ambulatory Visit (HOSPITAL_COMMUNITY): Payer: Self-pay

## 2023-04-02 ENCOUNTER — Other Ambulatory Visit (HOSPITAL_COMMUNITY): Payer: Self-pay

## 2023-04-30 ENCOUNTER — Other Ambulatory Visit (HOSPITAL_COMMUNITY): Payer: Self-pay

## 2023-05-06 ENCOUNTER — Telehealth: Payer: Self-pay | Admitting: Family Medicine

## 2023-05-06 ENCOUNTER — Encounter: Payer: Self-pay | Admitting: Family Medicine

## 2023-05-06 ENCOUNTER — Ambulatory Visit: Payer: Commercial Managed Care - PPO | Admitting: Family Medicine

## 2023-05-06 ENCOUNTER — Other Ambulatory Visit (HOSPITAL_COMMUNITY): Payer: Self-pay

## 2023-05-06 VITALS — BP 120/78 | HR 99 | Temp 98.8°F | Wt 195.0 lb

## 2023-05-06 DIAGNOSIS — L03012 Cellulitis of left finger: Secondary | ICD-10-CM | POA: Diagnosis not present

## 2023-05-06 MED ORDER — CEPHALEXIN 500 MG PO CAPS
500.0000 mg | ORAL_CAPSULE | Freq: Three times a day (TID) | ORAL | 0 refills | Status: DC
  Filled 2023-05-06: qty 21, 7d supply, fill #0

## 2023-05-06 NOTE — Telephone Encounter (Signed)
Copied from CRM [id number]. Topic: Clinical - Medication Question >> May 06, 2023  9:06 AM Leavy Cella D wrote: Reason for CRM: Patient has an infection on her left middle finger. Patient has the next available appointment scheduled with Dr.Banks on February 6th . Patient would like to know if an antibiotic can be prescribed to her today to help with the infection

## 2023-05-06 NOTE — Progress Notes (Signed)
   Subjective:    Patient ID: Laura Marsh, female    DOB: [birth year], 68 y.o.   MRN: [id number]  HPI Here for one week of soreness and swelling around the nail on her left 3rd finger. Soaking in war water with Epsom salts helps a little.    Review of Systems  Constitutional: Negative.   Respiratory: Negative.    Cardiovascular: Negative.   Skin:  Positive for color change.       Objective:   Physical Exam Constitutional:      Appearance: Normal appearance.  Cardiovascular:     Rate and Rhythm: Normal rate and regular rhythm.     Pulses: Normal pulses.     Heart sounds: Normal heart sounds.  Pulmonary:     Effort: Pulmonary effort is normal.     Breath sounds: Normal breath sounds.  Skin:    Comments: The skin around the left 3rd fingernail is red, puffy, and tender  Neurological:     Mental Status: She is alert.           Assessment & Plan:  Paronychia, treat with 7 days of Keflex.  Gershon Crane, MD

## 2023-05-06 NOTE — Telephone Encounter (Signed)
Pt has an appt with dr fry today 05-06-2023

## 2023-05-12 ENCOUNTER — Ambulatory Visit: Payer: Commercial Managed Care - PPO | Admitting: Family Medicine

## 2023-05-19 ENCOUNTER — Other Ambulatory Visit (HOSPITAL_COMMUNITY): Payer: Self-pay

## 2023-05-19 DIAGNOSIS — D128 Benign neoplasm of rectum: Secondary | ICD-10-CM | POA: Diagnosis not present

## 2023-05-19 MED ORDER — NA SULFATE-K SULFATE-MG SULF 17.5-3.13-1.6 GM/177ML PO SOLN
ORAL | 0 refills | Status: DC
  Filled 2023-05-19 – 2023-07-18 (×2): qty 354, 2d supply, fill #0

## 2023-05-22 ENCOUNTER — Other Ambulatory Visit (HOSPITAL_COMMUNITY): Payer: Self-pay

## 2023-05-22 MED ORDER — NA SULFATE-K SULFATE-MG SULF 17.5-3.13-1.6 GM/177ML PO SOLN
ORAL | 0 refills | Status: DC
  Filled 2023-05-22: qty 354, 2d supply, fill #0
  Filled 2023-08-16: qty 354, 1d supply, fill #0

## 2023-05-23 ENCOUNTER — Other Ambulatory Visit (HOSPITAL_COMMUNITY): Payer: Self-pay

## 2023-05-25 ENCOUNTER — Other Ambulatory Visit: Payer: Self-pay

## 2023-05-28 ENCOUNTER — Other Ambulatory Visit (HOSPITAL_COMMUNITY): Payer: Self-pay

## 2023-05-30 ENCOUNTER — Other Ambulatory Visit (HOSPITAL_COMMUNITY): Payer: Self-pay

## 2023-06-03 ENCOUNTER — Encounter: Payer: Self-pay | Admitting: Internal Medicine

## 2023-06-03 ENCOUNTER — Other Ambulatory Visit (HOSPITAL_COMMUNITY): Payer: Self-pay

## 2023-06-03 ENCOUNTER — Other Ambulatory Visit (HOSPITAL_BASED_OUTPATIENT_CLINIC_OR_DEPARTMENT_OTHER): Payer: Self-pay

## 2023-06-03 ENCOUNTER — Ambulatory Visit: Payer: Commercial Managed Care - PPO | Admitting: Internal Medicine

## 2023-06-03 ENCOUNTER — Other Ambulatory Visit: Payer: Self-pay

## 2023-06-03 VITALS — BP 118/76 | HR 112 | Ht 63.0 in | Wt 193.0 lb

## 2023-06-03 DIAGNOSIS — E785 Hyperlipidemia, unspecified: Secondary | ICD-10-CM | POA: Diagnosis not present

## 2023-06-03 DIAGNOSIS — Z7984 Long term (current) use of oral hypoglycemic drugs: Secondary | ICD-10-CM

## 2023-06-03 DIAGNOSIS — E1165 Type 2 diabetes mellitus with hyperglycemia: Secondary | ICD-10-CM

## 2023-06-03 DIAGNOSIS — Z7985 Long-term (current) use of injectable non-insulin antidiabetic drugs: Secondary | ICD-10-CM | POA: Diagnosis not present

## 2023-06-03 LAB — POCT GLYCOSYLATED HEMOGLOBIN (HGB A1C): Hemoglobin A1C: 9.2 % — AB (ref 4.0–5.6)

## 2023-06-03 MED ORDER — GLIPIZIDE ER 10 MG PO TB24
20.0000 mg | ORAL_TABLET | Freq: Every day | ORAL | 3 refills | Status: DC
  Filled 2023-06-03 – 2023-08-16 (×3): qty 180, 90d supply, fill #0

## 2023-06-03 MED ORDER — MOUNJARO 15 MG/0.5ML ~~LOC~~ SOAJ
15.0000 mg | SUBCUTANEOUS | 3 refills | Status: DC
  Filled 2023-06-03: qty 6, 84d supply, fill #0
  Filled 2023-06-24: qty 2, 28d supply, fill #0
  Filled 2023-07-18: qty 2, 28d supply, fill #1
  Filled 2023-08-16: qty 2, 28d supply, fill #2
  Filled 2023-09-10: qty 2, 28d supply, fill #3
  Filled 2023-10-10: qty 2, 28d supply, fill #4
  Filled 2023-11-06 – 2023-11-07 (×2): qty 2, 28d supply, fill #5

## 2023-06-03 MED ORDER — FREESTYLE LIBRE 3 PLUS SENSOR MISC
1.0000 | 3 refills | Status: DC
  Filled 2023-06-03 – 2023-11-06 (×7): qty 6, 90d supply, fill #0

## 2023-06-03 MED ORDER — METFORMIN HCL ER 750 MG PO TB24
750.0000 mg | ORAL_TABLET | Freq: Every day | ORAL | 3 refills | Status: DC
  Filled 2023-06-03: qty 90, 90d supply, fill #0
  Filled 2023-09-03: qty 90, 90d supply, fill #1
  Filled 2023-10-28: qty 90, 90d supply, fill #2

## 2023-06-03 NOTE — Patient Instructions (Addendum)
-   Continue Metformin 750 mg XR, 1 tablet daily - Change glipizide 10 mg XL, 2 tablets daily - Increase Mounjaro 15 mg weekly    HOW TO TREAT LOW BLOOD SUGARS (Blood sugar LESS THAN 70 MG/DL) Please follow the RULE OF 15 for the treatment of hypoglycemia treatment (when your (blood sugars are less than 70 mg/dL)   STEP 1: Take 15 grams of carbohydrates when your blood sugar is low, which includes:  3-4 GLUCOSE TABS  OR 3-4 OZ OF JUICE OR REGULAR SODA OR ONE TUBE OF GLUCOSE GEL    STEP 2: RECHECK blood sugar in 15 MINUTES STEP 3: If your blood sugar is still low at the 15 minute recheck --> then, go back to STEP 1 and treat AGAIN with another 15 grams of carbohydrates.

## 2023-06-03 NOTE — Progress Notes (Signed)
 Name: Laura Marsh  Age/ Sex: 68 y.o., female   MRN/ DOB: [id number], 1955-06-13     PCP: Deeann Saint, MD   Reason for Endocrinology Evaluation: Type 2 Diabetes Mellitus     Initial Endocrinology Clinic Visit: 06/16/2018    PATIENT IDENTIFIER: Laura Marsh is a 68 y.o. female with a past medical history of HTN, T2DM and Dyslipidemia. The patient has followed with Endocrinology clinic since 06/16/2018 for consultative assistance with management of her diabetes.  DIABETIC HISTORY:  Laura Marsh was diagnosed with T2DM ~ 2014. She has been on oral glycemic agents for years. She has never been on insulin therapy. Her hemoglobin A1c has ranged from 6.5% in 2014, peaking at 11.0% in 2019.   On her initial visit to our clinic she was on Metformin, Glipizide and Januvia, with an A1c 9.7 %.  We stopped Januvia and glipizide and started Ozempic with continuation of Metformin    Works [facility]  I have attempted to screen her for Cushing's syndrome due to spontaneous hypokalemia and weight gain in 06/2020 but she did not return the urine sample.    Stopped Marcelline Deist 01/2022 due to recurrent yeast infections   SUBJECTIVE:   During the last visit (12/31/2022): A1c 8.7% .     Today (06/03/2023): Laura Marsh is here for a follow up on diabetes follow up.  She checks her blood sugars occasionally . The patient has not had hypoglycemic episodes since the last clinic visit.    She endorses weight loss with Mounjaro  Has mild constipation  Denies nausea or vomiting    HOME DIABETES REGIMEN:  Metformin 750 mg XR  at lunch  Glipizide 10 mg XL , daily Mounjaro 12.5 mg weekly ( Sundays)     GLUCOSE LOG    126 - 163 mg/dL    DIABETIC COMPLICATIONS: Microvascular complications:   Denies: CKD, retinopathy, neuropathy  Last eye exam: Completed 02/03/2021   Macrovascular complications: Denies: CAD, PVD, CVA    HISTORY:  Past Medical  History:  Past Medical History:  Diagnosis Date   Colon polyp    Diabetes mellitus    Elevated cholesterol    Hypertension    Past Surgical History:  Past Surgical History:  Procedure Laterality Date   ABDOMINAL HYSTERECTOMY     COLONOSCOPY     COLONOSCOPY N/A 09/15/2022   Procedure: COLONOSCOPY;  Surgeon: Toledo, Boykin Nearing, MD;  Location: [facility];  Service: Gastroenterology;  Laterality: N/A;   COLONOSCOPY WITH PROPOFOL N/A 11/08/2017   Procedure: COLONOSCOPY WITH PROPOFOL;  Surgeon: Toledo, Boykin Nearing, MD;  Location: [facility]OSCOPY;  Service: Gastroenterology;  Laterality: N/A;   COLONOSCOPY WITH PROPOFOL N/A 04/28/2020   Procedure: COLONOSCOPY WITH PROPOFOL;  Surgeon: Sung Amabile, DO;  Location: [facility] ENDOSCOPY;  Service: General;  Laterality: N/A;   Social History:  reports that she has never smoked. She has never used smokeless tobacco. She reports current alcohol use. She reports that she does not use drugs. Family History:  Family History  Problem Relation Age of Onset   Diabetes Mother    Congestive Heart Failure Mother      HOME MEDICATIONS: Allergies as of 06/03/2023       Reactions   Trulicity [dulaglutide] Itching   Rash/itching at injection site        Medication List        Accurate as of June 03, 2023 11:34 AM. If you have  any questions, ask your nurse or doctor.          STOP taking these medications    Mounjaro 12.5 MG/0.5ML Pen Generic drug: tirzepatide Replaced by: Greggory Keen 15 MG/0.5ML Pen Stopped by: Laura Marsh       TAKE these medications    albuterol 108 (90 Base) MCG/ACT inhaler Commonly known as: VENTOLIN HFA Inhale 1-2 puffs into the lungs every 6 (six) hours as needed for wheezing or shortness of breath.   Alcohol Swabs Pads Use as directed.   amLODipine 5 MG tablet Commonly known as: NORVASC Take 1 tablet (5 mg total) by mouth daily.   aspirin EC 81 MG tablet Take 81 mg by mouth daily. Swallow  whole. Taken it twice a week   atorvastatin 20 MG tablet Commonly known as: LIPITOR Take 1 tablet  by mouth daily.   cephALEXin 500 MG capsule Commonly known as: KEFLEX Take 1 capsule (500 mg total) by mouth 3 (three) times daily.   fluticasone 50 MCG/ACT nasal spray Commonly known as: FLONASE Place 2 sprays into both nostrils daily.   FreeStyle Libre 3 Plus Sensor Misc 1 Device by Other route every 14 (fourteen) days. Change sensor every 15 days. Started by: Laura Marsh   FREESTYLE LITE test strip Generic drug: glucose blood Use to test blood sugar 2 (two) times daily.   glipiZIDE 10 MG 24 hr tablet Commonly known as: glipiZIDE XL Take 2 tablets (20 mg total) by mouth daily with breakfast. What changed: how much to take Changed by: Laura Marsh   lisinopril 30 MG tablet Commonly known as: ZESTRIL Take 1 tablet (30 mg total) by mouth daily.   loratadine 10 MG tablet Commonly known as: CLARITIN Take 10 mg by mouth daily as needed.   METAMUCIL FIBER PO Take by mouth in the morning and at bedtime.   metFORMIN 750 MG 24 hr tablet Commonly known as: GLUCOPHAGE-XR Take 1 tablet (750 mg total) by mouth daily before lunch.   Mounjaro 15 MG/0.5ML Pen Generic drug: tirzepatide Inject 15 mg into the skin once a week. Replaces: Mounjaro 12.5 MG/0.5ML Pen Started by: Laura Marsh   multivitamin with minerals Tabs tablet Take 1 tablet by mouth daily.   Na Sulfate-K Sulfate-Mg Sulfate concentrate 17.5-3.13-1.6 GM/177ML Soln Commonly known as: SUPREP Take 2 Bottles (1 kit total) by mouth as directed One kit contains 2 bottles.  Follow the [facility] Preparation Instructions.   Na Sulfate-K Sulfate-Mg Sulfate concentrate 17.5-3.13-1.6 GM/177ML Soln Commonly known as: SUPREP Take 2 Bottles (1 kit total) by mouth as directed. Follow the [facility] Preparation Instructions.   Na Sulfate-K Sulfate-Mg Sulfate concentrate 17.5-3.13-1.6 GM/177ML Soln Commonly  known as: SUPREP Take by mouth. Start taking on: Aug 17, 2023   nystatin-triamcinolone ointment Commonly known as: MYCOLOG Apply 1 Application topically 2 (two) times daily.   triamterene-hydrochlorothiazide 37.5-25 MG tablet Commonly known as: MAXZIDE-25 Take 1 tablet by mouth daily.   Vitamin D (Ergocalciferol) 1.25 MG (50000 UNIT) Caps capsule Commonly known as: DRISDOL Take 1 capsule (50,000 Units total) by mouth every 7 (seven) days.      PHYSICAL EXAM: VS: BP 118/76 (BP Location: Left Arm, Patient Position: Sitting, Cuff Size: Normal)   Pulse (!) 112   Ht 5\' 3"  (1.6 m)   Wt 193 lb (87.5 kg)   SpO2 98%   BMI 34.19 kg/m    EXAM: General: Pt appears well and is in NAD  Lungs: Clear with good BS bilat  Heart: Auscultation: RRR    LE: Trace pitting edema   Mental Status: Judgment, insight: intact Mood and affect: no depression, anxiety, or agitation    DM Foot exam 06/18/2022  The skin of the feet is intact without sores or ulcerations. The pedal pulses are 2+ on right and 2+ on left. The sensation is intact to a screening 5.07, 10 gram monofilament bilaterally    DATA REVIEWED:  Lab Results  Component Value Date   HGBA1C 9.2 (A) 06/03/2023   HGBA1C 8.7 (H) 11/26/2022   HGBA1C 9.7 (A) 06/18/2022      Latest Reference Range & Units 11/26/22 10:33  Sodium 135 - 145 mEq/L 141  Potassium 3.5 - 5.1 mEq/L 3.8  Chloride 96 - 112 mEq/L 103  CO2 19 - 32 mEq/L 30  Glucose 70 - 99 mg/dL 956 (H)  BUN 6 - 23 mg/dL 15  Creatinine 3.87 - 5.64 mg/dL 3.32  Calcium 8.4 - 95.1 mg/dL 88.4  Alkaline Phosphatase 39 - 117 U/L 105  Albumin 3.5 - 5.2 g/dL 4.2  AST 0 - 37 U/L 15  ALT 0 - 35 U/L 15  Total Protein 6.0 - 8.3 g/dL 6.7  Total Bilirubin 0.2 - 1.2 mg/dL 0.7  GFR >16.60 mL/min 73.17  Total CHOL/HDL Ratio  3  Cholesterol 0 - 200 mg/dL 630  HDL Cholesterol >16.01 mg/dL 09.32  LDL (calc) 0 - 99 mg/dL 355 (H)  MICROALB/CREAT RATIO 0.0 - 30.0 mg/g 1.3  NonHDL   117.88  Triglycerides 0.0 - 149.0 mg/dL 73.2  VLDL 0.0 - 20.2 mg/dL 54.2  (H): Data is abnormally high   Old records , labs and images have been reviewed.    ASSESSMENT / PLAN / RECOMMENDATIONS:   1) Type 2 Diabetes Mellitus, Poorly controlled, Without complications - Most recent A1c of 9.2 %. Goal A1c < 7.0 %.     -Patient continues with worsening glycemic control -I have offered insulin , but she declines at this time -Intolerant to higher doses of metformin -Intolerant to [place] -I had to change her glipizide from regular to XL, because she was having difficulty taking it twice daily -She assures me compliance with her medications -Discussed the importance of low-carb diet and exercise -Not a candidate for pioglitazone due to lower extremity edema -I will increase glipizide and Mounjaro as below   MEDICATIONS:   Continue metformin 750 mg XR daily  Increase Glipizide 10 mg XL,  2 tabs daily  Increase Mounjaro 15 mg weekly       EDUCATION / INSTRUCTIONS: BG monitoring instructions: Patient is instructed to check her blood sugars 2 times a day, fasting and bedtime. Call [facility] if: BG persistently < 70  I reviewed the Rule of 15 for the treatment of hypoglycemia in detail with the patient. Literature supplied.  2) Diabetic complications:  Eye: Does not have known diabetic retinopathy.  Neuro/ Feet: Does not have known diabetic peripheral neuropathy. Renal: Patient does not have known baseline CKD. She is  on an ACEI/ARB at present.  Micro albumin urea testing is normal and up-to-date  3) Dyslipidemia :   -LDL above goal on last check up -Discussed increased risk of cardiovascular events with out-of-control LDL -I have encouraged compliance with statin therapy, we also discussed the importance of following a low fat diet -If this remains elevated, will increase atorvastatin  Medication Continue atorvastatin 20 mg daily   F/U in  3 months     Signed electronically by: Lamount Cranker  Lonzo Cloud, MD  [facility]  [facility] [address] [place], [place] [address] Phone: [phone] FAX: [phone]   CC: Deeann Saint, MD [address] [place] [address] Phone: [phone]  Fax: [phone]  Return to Endocrinology clinic as below: Future Appointments  Date Time Provider Department Center  09/02/2023 11:10 AM Kerrigan Gombos, Konrad Dolores, MD [id number] None

## 2023-06-10 ENCOUNTER — Other Ambulatory Visit (HOSPITAL_COMMUNITY): Payer: Self-pay

## 2023-06-24 ENCOUNTER — Other Ambulatory Visit (HOSPITAL_COMMUNITY): Payer: Self-pay

## 2023-06-24 ENCOUNTER — Telehealth: Payer: Self-pay

## 2023-06-24 NOTE — Telephone Encounter (Signed)
 Pharmacy Patient Advocate Encounter   Received notification from Pt Calls Messages that prior authorization for Greggory Keen is required/requested.   Insurance verification completed.   The patient is insured through [facility] .   Per test claim: PA required; PA started via CoverMyMeds. KEY [id number] . Waiting for clinical questions to populate.   PA cancelled: Member should be able to get the drug/product without a PA at this time

## 2023-06-24 NOTE — Telephone Encounter (Signed)
 Mounjaro needs PA

## 2023-06-27 ENCOUNTER — Other Ambulatory Visit: Payer: Self-pay

## 2023-07-06 ENCOUNTER — Other Ambulatory Visit (HOSPITAL_COMMUNITY): Payer: Self-pay

## 2023-07-16 ENCOUNTER — Other Ambulatory Visit (HOSPITAL_COMMUNITY): Payer: Self-pay

## 2023-07-19 ENCOUNTER — Other Ambulatory Visit: Payer: Self-pay

## 2023-07-19 ENCOUNTER — Other Ambulatory Visit (HOSPITAL_COMMUNITY): Payer: Self-pay

## 2023-07-20 ENCOUNTER — Other Ambulatory Visit (HOSPITAL_COMMUNITY): Payer: Self-pay

## 2023-07-23 ENCOUNTER — Other Ambulatory Visit (HOSPITAL_COMMUNITY): Payer: Self-pay

## 2023-07-30 ENCOUNTER — Other Ambulatory Visit (HOSPITAL_COMMUNITY): Payer: Self-pay

## 2023-08-15 ENCOUNTER — Other Ambulatory Visit (HOSPITAL_COMMUNITY): Payer: Self-pay

## 2023-08-17 ENCOUNTER — Other Ambulatory Visit (HOSPITAL_COMMUNITY): Payer: Self-pay

## 2023-08-17 ENCOUNTER — Other Ambulatory Visit: Payer: Self-pay

## 2023-08-18 ENCOUNTER — Other Ambulatory Visit (HOSPITAL_COMMUNITY): Payer: Self-pay

## 2023-08-18 DIAGNOSIS — D128 Benign neoplasm of rectum: Secondary | ICD-10-CM | POA: Diagnosis not present

## 2023-08-18 DIAGNOSIS — Z6834 Body mass index (BMI) 34.0-34.9, adult: Secondary | ICD-10-CM | POA: Diagnosis not present

## 2023-08-18 DIAGNOSIS — K621 Rectal polyp: Secondary | ICD-10-CM | POA: Diagnosis not present

## 2023-08-18 DIAGNOSIS — Z538 Procedure and treatment not carried out for other reasons: Secondary | ICD-10-CM | POA: Diagnosis not present

## 2023-08-18 DIAGNOSIS — E785 Hyperlipidemia, unspecified: Secondary | ICD-10-CM | POA: Diagnosis not present

## 2023-08-18 DIAGNOSIS — Z7984 Long term (current) use of oral hypoglycemic drugs: Secondary | ICD-10-CM | POA: Diagnosis not present

## 2023-08-18 DIAGNOSIS — Z79899 Other long term (current) drug therapy: Secondary | ICD-10-CM | POA: Diagnosis not present

## 2023-08-18 DIAGNOSIS — Z860101 Personal history of adenomatous and serrated colon polyps: Secondary | ICD-10-CM | POA: Diagnosis not present

## 2023-08-18 DIAGNOSIS — E119 Type 2 diabetes mellitus without complications: Secondary | ICD-10-CM | POA: Diagnosis not present

## 2023-08-18 DIAGNOSIS — I10 Essential (primary) hypertension: Secondary | ICD-10-CM | POA: Diagnosis not present

## 2023-08-18 MED ORDER — CIPROFLOXACIN HCL 500 MG PO TABS
ORAL_TABLET | ORAL | 0 refills | Status: DC
  Filled 2023-08-18: qty 10, 5d supply, fill #0

## 2023-08-19 ENCOUNTER — Other Ambulatory Visit (HOSPITAL_COMMUNITY): Payer: Self-pay

## 2023-08-26 DIAGNOSIS — D128 Benign neoplasm of rectum: Secondary | ICD-10-CM | POA: Diagnosis not present

## 2023-08-26 DIAGNOSIS — K621 Rectal polyp: Secondary | ICD-10-CM | POA: Diagnosis not present

## 2023-08-27 DIAGNOSIS — D128 Benign neoplasm of rectum: Secondary | ICD-10-CM | POA: Diagnosis not present

## 2023-09-02 ENCOUNTER — Ambulatory Visit: Payer: Commercial Managed Care - PPO | Admitting: Internal Medicine

## 2023-09-03 ENCOUNTER — Other Ambulatory Visit (HOSPITAL_COMMUNITY): Payer: Self-pay

## 2023-09-10 ENCOUNTER — Other Ambulatory Visit (HOSPITAL_COMMUNITY): Payer: Self-pay

## 2023-09-16 ENCOUNTER — Encounter: Payer: Self-pay | Admitting: Family Medicine

## 2023-09-16 ENCOUNTER — Ambulatory Visit: Admitting: Family Medicine

## 2023-09-16 ENCOUNTER — Other Ambulatory Visit (HOSPITAL_COMMUNITY): Payer: Self-pay

## 2023-09-16 VITALS — BP 120/66 | HR 107 | Temp 98.2°F | Ht 63.0 in | Wt 194.6 lb

## 2023-09-16 DIAGNOSIS — L02419 Cutaneous abscess of limb, unspecified: Secondary | ICD-10-CM

## 2023-09-16 DIAGNOSIS — L03119 Cellulitis of unspecified part of limb: Secondary | ICD-10-CM

## 2023-09-16 DIAGNOSIS — L0231 Cutaneous abscess of buttock: Secondary | ICD-10-CM | POA: Diagnosis not present

## 2023-09-16 DIAGNOSIS — B379 Candidiasis, unspecified: Secondary | ICD-10-CM

## 2023-09-16 DIAGNOSIS — K621 Rectal polyp: Secondary | ICD-10-CM | POA: Diagnosis not present

## 2023-09-16 DIAGNOSIS — T3695XA Adverse effect of unspecified systemic antibiotic, initial encounter: Secondary | ICD-10-CM | POA: Diagnosis not present

## 2023-09-16 MED ORDER — AMOXICILLIN-POT CLAVULANATE 875-125 MG PO TABS
1.0000 | ORAL_TABLET | Freq: Two times a day (BID) | ORAL | 0 refills | Status: DC
  Filled 2023-09-16: qty 20, 10d supply, fill #0

## 2023-09-16 MED ORDER — FLUCONAZOLE 150 MG PO TABS
ORAL_TABLET | ORAL | 0 refills | Status: DC
  Filled 2023-09-16: qty 2, 6d supply, fill #0

## 2023-09-16 NOTE — Progress Notes (Signed)
 Established Patient Office Visit   Subjective  Patient ID: Laura Marsh, female    DOB: 10/25/1955  Age: 68 y.o. MRN: [id number]  Chief Complaint  Patient presents with   Medical Management of Chronic Issues    Patient came in today for a boil, on her Right bottom cheek for a week. Boil drained with a mixture of blood and puss yesterday 09/15/2023     Patient is a 68 year old female seen for acute concern.  Patient endorses pain bump back of upper right thigh and right buttock x 1 week.  Areas are painful.  1 burst yesterday while at work.  Draining pus and blood.  Patient denies fever, chills, nausea, vomiting.  Patient mentions taking a a dose of leftover ciprofloxacin .  Prescription originally written last month after complications during colonoscopy.  Per pt they were unable to remove a large polyp near rectum and tore part of the lining of her intestines.  She forgot to take the last 2 doses of Cipro .  Has surgery at the end of the month to remove the polyp.     Patient Active Problem List   Diagnosis Date Noted   Dyslipidemia 12/31/2022   Flu-like symptoms 04/02/2021   Hyperlipidemia associated with type 2 diabetes mellitus (HCC) 08/25/2017   Morbid obesity (HCC) 08/25/2017   Ganglion cyst of wrist 12/25/2014   De Quervain's disease (tenosynovitis) 06/25/2014   Hypertension associated with diabetes (HCC) 04/13/2012   Diabetes (HCC) 04/13/2012   Past Medical History:  Diagnosis Date   Colon polyp    Diabetes mellitus    Elevated cholesterol    Hypertension    Past Surgical History:  Procedure Laterality Date   ABDOMINAL HYSTERECTOMY     COLONOSCOPY     COLONOSCOPY N/A 09/15/2022   Procedure: COLONOSCOPY;  Surgeon: Toledo, Alphonsus Jeans, MD;  Location: [facility] END[facility];  Service: Gastroenterology;  Laterality: N/A;   COLONOSCOPY WITH PROPOFOL  N/A 11/08/2017   Procedure: COLONOSCOPY WITH PROPOFOL ;  Surgeon: Toledo, Alphonsus Jeans, MD;  Location: [facility];  Service:  Gastroenterology;  Laterality: N/A;   COLONOSCOPY WITH PROPOFOL  N/A 04/28/2020   Procedure: COLONOSCOPY WITH PROPOFOL ;  Surgeon: Conrado Delay, DO;  Location: [facility];  Service: General;  Laterality: N/A;   Social History   Tobacco Use   Smoking status: Never   Smokeless tobacco: Never  Vaping Use   Vaping status: Never Used  Substance Use Topics   Alcohol  use: Yes    Comment: rarely   Drug use: No   Family History  Problem Relation Age of Onset   Diabetes Mother    Congestive Heart Failure Mother    Allergies  Allergen Reactions   Trulicity  [Dulaglutide ] Itching    Rash/itching at injection site    ROS Negative unless stated above    Objective:     BP 120/66 (BP Location: Left Arm, Patient Position: Sitting, Cuff Size: Normal)   Pulse (!) 107   Temp 98.2 F (36.8 C) (Oral)   Ht 5' 3 (1.6 m)   Wt 194 lb 9.6 oz (88.3 kg)   SpO2 96%   BMI 34.47 kg/m  BP Readings from Last 3 Encounters:  09/16/23 120/66  06/03/23 118/76  05/06/23 120/78   Wt Readings from Last 3 Encounters:  09/16/23 194 lb 9.6 oz (88.3 kg)  06/03/23 193 lb (87.5 kg)  05/06/23 195 lb (88.5 kg)      Physical Exam Constitutional:      General: She is not in acute distress.  Appearance: Normal appearance.  HENT:     Head: Normocephalic and atraumatic.     Nose: Nose normal.     Mouth/Throat:     Mouth: Mucous membranes are moist.   Cardiovascular:     Rate and Rhythm: Normal rate and regular rhythm.     Heart sounds: Normal heart sounds. No murmur heard.    No gallop.  Pulmonary:     Effort: Pulmonary effort is normal. No respiratory distress.     Breath sounds: Normal breath sounds. No wheezing, rhonchi or rales.   Skin:    General: Skin is warm and dry.         Comments: A 1 cm area on posterior upper right thigh midline TTP with desquamination of skin, erythema, and central opening draining thick yellow purulent fluid.  A smaller 7 mm area on middle of right buttock  with surrounding erythema nondraining and a 6 mm area of laterally with hyperpigmentation of skin and no erythema or drainage.   Neurological:     Mental Status: She is alert and oriented to person, place, and time.        09/16/2023   11:42 AM 05/06/2023   11:29 AM 11/26/2022    9:46 AM  Depression screen PHQ 2/9  Decreased Interest 0 0 0  Down, Depressed, Hopeless 0 0 0  PHQ - 2 Score 0 0 0  Altered sleeping 0 0   Tired, decreased energy 0 0   Change in appetite 0 0   Feeling bad or failure about yourself  0 0   Trouble concentrating 0 0   Moving slowly or fidgety/restless 0 0   Suicidal thoughts 0 0   PHQ-9 Score 0 0   Difficult doing work/chores Not difficult at all Not difficult at all       09/16/2023   11:42 AM 05/06/2023   11:29 AM 11/26/2022    9:46 AM 12/12/2020    9:57 AM  GAD 7 : Generalized Anxiety Score  Nervous, Anxious, on Edge 0 0 0 0  Control/stop worrying 0 1 0 0  Worry too much - different things 0 0 0 0  Trouble relaxing 3 0 0 0  Restless 0 0 0 0  Easily annoyed or irritable 0 0 0 0  Afraid - awful might happen 0 0 0 0  Total GAD 7 Score 3 1 0 0  Anxiety Difficulty Not difficult at all Not difficult at all Not difficult at all      No results found for any visits on 09/16/23.    Assessment & Plan:   Cellulitis and abscess of leg, except foot -     Amoxicillin -Pot Clavulanate; Take 1 tablet by mouth 2 (two) times daily.  Dispense: 20 tablet; Refill: 0  Abscess of right buttock -     Amoxicillin -Pot Clavulanate; Take 1 tablet by mouth 2 (two) times daily.  Dispense: 20 tablet; Refill: 0  Antibiotic-induced yeast infection -     Fluconazole; Take 1 tablet by mouth now.  Repeat dose in 3 days.  Dispense: 2 tablet; Refill: 0  Rectal polyp  Patient with 3 abscesses on posterior right leg and buttock.  Abscess on posterior thigh with cellulitis tracking medially around thigh.  Area marked with skin marker.  Start ABX and warm compresses.  If areas on  right buttock do not drain/healed will need I&D.  Given strict precautions.  Diflucan as needed for HTN induced yeast infection.  Continue follow-up with GI and  surgery for rectal polyp.  Surgery scheduled 09/27/2023.  Return if symptoms worsen or fail to improve.   Viola Greulich, MD

## 2023-09-27 DIAGNOSIS — I10 Essential (primary) hypertension: Secondary | ICD-10-CM | POA: Diagnosis not present

## 2023-09-27 DIAGNOSIS — L905 Scar conditions and fibrosis of skin: Secondary | ICD-10-CM | POA: Diagnosis not present

## 2023-09-27 DIAGNOSIS — Z6833 Body mass index (BMI) 33.0-33.9, adult: Secondary | ICD-10-CM | POA: Diagnosis not present

## 2023-09-27 DIAGNOSIS — E119 Type 2 diabetes mellitus without complications: Secondary | ICD-10-CM | POA: Diagnosis not present

## 2023-09-27 DIAGNOSIS — Z7984 Long term (current) use of oral hypoglycemic drugs: Secondary | ICD-10-CM | POA: Diagnosis not present

## 2023-09-27 DIAGNOSIS — E66811 Obesity, class 1: Secondary | ICD-10-CM | POA: Diagnosis not present

## 2023-09-27 DIAGNOSIS — Z7982 Long term (current) use of aspirin: Secondary | ICD-10-CM | POA: Diagnosis not present

## 2023-09-27 DIAGNOSIS — D128 Benign neoplasm of rectum: Secondary | ICD-10-CM | POA: Diagnosis not present

## 2023-09-27 DIAGNOSIS — Z79899 Other long term (current) drug therapy: Secondary | ICD-10-CM | POA: Diagnosis not present

## 2023-09-28 ENCOUNTER — Other Ambulatory Visit (HOSPITAL_COMMUNITY): Payer: Self-pay

## 2023-10-10 ENCOUNTER — Other Ambulatory Visit: Payer: Self-pay

## 2023-10-10 ENCOUNTER — Other Ambulatory Visit (HOSPITAL_COMMUNITY): Payer: Self-pay

## 2023-10-11 ENCOUNTER — Other Ambulatory Visit (HOSPITAL_COMMUNITY): Payer: Self-pay

## 2023-10-28 DIAGNOSIS — K621 Rectal polyp: Secondary | ICD-10-CM | POA: Diagnosis not present

## 2023-10-29 ENCOUNTER — Other Ambulatory Visit (HOSPITAL_COMMUNITY): Payer: Self-pay

## 2023-11-04 ENCOUNTER — Ambulatory Visit: Admitting: Internal Medicine

## 2023-11-06 ENCOUNTER — Other Ambulatory Visit: Payer: Self-pay | Admitting: Internal Medicine

## 2023-11-06 ENCOUNTER — Other Ambulatory Visit: Payer: Self-pay | Admitting: Family Medicine

## 2023-11-06 DIAGNOSIS — T3695XA Adverse effect of unspecified systemic antibiotic, initial encounter: Secondary | ICD-10-CM

## 2023-11-07 ENCOUNTER — Other Ambulatory Visit: Payer: Self-pay

## 2023-11-07 ENCOUNTER — Other Ambulatory Visit (HOSPITAL_COMMUNITY): Payer: Self-pay

## 2023-11-07 MED ORDER — FREESTYLE LITE TEST VI STRP
ORAL_STRIP | Freq: Two times a day (BID) | 3 refills | Status: DC
  Filled 2023-11-07: qty 100, 50d supply, fill #0

## 2023-11-11 ENCOUNTER — Other Ambulatory Visit (HOSPITAL_COMMUNITY): Payer: Self-pay

## 2023-11-17 ENCOUNTER — Other Ambulatory Visit (HOSPITAL_COMMUNITY): Payer: Self-pay

## 2023-11-18 ENCOUNTER — Encounter (HOSPITAL_COMMUNITY): Payer: Self-pay | Admitting: Pharmacist

## 2023-11-18 ENCOUNTER — Other Ambulatory Visit (HOSPITAL_COMMUNITY): Payer: Self-pay

## 2023-11-24 NOTE — Progress Notes (Signed)
 Name: Laura Marsh  Age/ Sex: 68 y.o., female   MRN/ DOB: [id number], 02-Mar-1956     PCP: Mercer Clotilda SAUNDERS, MD   Reason for Endocrinology Evaluation: Type 2 Diabetes Mellitus     Initial Endocrinology Clinic Visit: 06/16/2018    Laura Marsh IDENTIFIER: Laura Marsh is a 68 y.o. female with a past medical history of HTN, T2DM and Dyslipidemia. The Laura Marsh has followed with Endocrinology clinic since 06/16/2018 for consultative assistance with management of her diabetes.  DIABETIC HISTORY:  Laura Marsh was diagnosed with T2DM ~ 2014. She has been on oral glycemic agents for years. She has never been on insulin therapy. Her hemoglobin A1c has ranged from 6.5% in 2014, peaking at 11.0% in 2019.   On her initial visit to our clinic she was on Metformin , Glipizide  and Januvia , with an A1c 9.7 %.  We stopped Januvia  and glipizide  and started Ozempic  with continuation of Metformin     Works [facility]  I have attempted to screen her for Cushing's syndrome due to spontaneous hypokalemia and weight gain in 06/2020 but she did not return the urine sample.    Stopped Farxiga  01/2022 due to recurrent yeast infections   SUBJECTIVE:   During the last visit (06/03/2023): A1c 9.2% .     Today (11/25/2023): Laura Marsh is here for a follow up on diabetes follow up.  She has not been checking glucose at home.    She went excision of rectal polyp under anesthesia, due to significant scarring.Healing properly   Denies nausea or vomiting  Continues with constipation alternating with diarrhea LE edema is better  She has been taking glycemic agents intermittently for the past month   HOME DIABETES REGIMEN:  Metformin  750 mg XR  at lunch  Glipizide  10 mg XL , daily Mounjaro  15 mg weekly    GLUCOSE LOG   N/a   DIABETIC COMPLICATIONS: Microvascular complications:   Denies: CKD, retinopathy, neuropathy  Last eye exam: Completed 11/2022     Macrovascular complications: Denies: CAD, PVD, CVA    HISTORY:  Past Medical History:  Past Medical History:  Diagnosis Date   Colon polyp    Diabetes mellitus    Elevated cholesterol    Hypertension    Past Surgical History:  Past Surgical History:  Procedure Laterality Date   ABDOMINAL HYSTERECTOMY     COLONOSCOPY     COLONOSCOPY N/A 09/15/2022   Procedure: COLONOSCOPY;  Surgeon: Toledo, Ladell POUR, MD;  Location: [facility] ENDOSCOPY;  Service: Gastroenterology;  Laterality: N/A;   COLONOSCOPY WITH PROPOFOL  N/A 11/08/2017   Procedure: COLONOSCOPY WITH PROPOFOL ;  Surgeon: Toledo, Ladell POUR, MD;  Location: [facility] ENDOSCOPY;  Service: Gastroenterology;  Laterality: N/A;   COLONOSCOPY WITH PROPOFOL  N/A 04/28/2020   Procedure: COLONOSCOPY WITH PROPOFOL ;  Surgeon: Tye Millet, DO;  Location: [facility] ENDOSCOPY;  Service: General;  Laterality: N/A;   Social History:  reports that she has never smoked. She has never used smokeless tobacco. She reports current alcohol  use. She reports that she does not use drugs. Family History:  Family History  Problem Relation Age of Onset   Diabetes Mother    Congestive Heart Failure Mother      HOME MEDICATIONS: Allergies as of 11/25/2023       Reactions   Trulicity  [dulaglutide ] Itching   Rash/itching at injection site        Medication List        Accurate as of  November 25, 2023  8:19 AM. If you have any questions, ask your nurse or doctor.          albuterol  108 (90 Base) MCG/ACT inhaler Commonly known as: VENTOLIN  HFA Inhale 1-2 puffs into the lungs every 6 (six) hours as needed for wheezing or shortness of breath.   Alcohol  Swabs  Pads Use as directed.   amLODipine  5 MG tablet Commonly known as: NORVASC  Take 1 tablet (5 mg total) by mouth daily.   amoxicillin -clavulanate 875-125 MG tablet Commonly known as: AUGMENTIN  Take 1 tablet by mouth 2 (two) times daily.   aspirin EC 81 MG tablet Take 81 mg by mouth daily. Swallow  whole. Taken it twice a week   atorvastatin  20 MG tablet Commonly known as: LIPITOR Take 1 tablet  by mouth daily.   fluconazole  150 MG tablet Commonly known as: DIFLUCAN  Take 1 tablet by mouth now.  Repeat dose in 3 days.   fluticasone  50 MCG/ACT nasal spray Commonly known as: FLONASE  Place 2 sprays into both nostrils daily.   FreeStyle Libre 3 Plus Sensor Misc Use as directed to check blood sugar. Change sensor every 15 days.   FREESTYLE LITE test strip Generic drug: glucose blood Use to test blood sugar 2 (two) times daily.   glipiZIDE  10 MG 24 hr tablet Commonly known as: glipiZIDE  XL Take 2 tablets (20 mg total) by mouth daily with breakfast.   lisinopril  30 MG tablet Commonly known as: ZESTRIL  Take 1 tablet (30 mg total) by mouth daily.   loratadine 10 MG tablet Commonly known as: CLARITIN Take 10 mg by mouth daily as needed.   METAMUCIL FIBER PO Take by mouth in the morning and at bedtime.   metFORMIN  750 MG 24 hr tablet Commonly known as: GLUCOPHAGE -XR Take 1 tablet (750 mg total) by mouth daily before lunch.   Mounjaro  15 MG/0.5ML Pen Generic drug: tirzepatide  Inject 15 mg into the skin once a week.   multivitamin with minerals Tabs tablet Take 1 tablet by mouth daily.   Na Sulfate-K Sulfate-Mg Sulfate concentrate 17.5-3.13-1.6 GM/177ML Soln Commonly known as: SUPREP Take 2 Bottles (1 kit total) by mouth as directed One kit contains 2 bottles.  Follow the [facility] Preparation Instructions.   Na Sulfate-K Sulfate-Mg Sulfate concentrate 17.5-3.13-1.6 GM/177ML Soln Commonly known as: SUPREP Take 2 Bottles (1 kit total) by mouth as directed. Follow the [facility] Preparation Instructions.   Na Sulfate-K Sulfate-Mg Sulfate concentrate 17.5-3.13-1.6 GM/177ML Soln Commonly known as: SUPREP Take by mouth.   nystatin -triamcinolone  ointment Commonly known as: MYCOLOG Apply 1 Application topically 2 (two) times daily.   triamterene -hydrochlorothiazide  37.5-25  MG tablet Commonly known as: MAXZIDE -25 Take 1 tablet by mouth daily.      PHYSICAL EXAM: VS: BP 120/82 (BP Location: Left Arm, Laura Marsh Position: Sitting, Cuff Size: Normal)   Pulse 80   Ht 5' 3 (1.6 m)   Wt 190 lb (86.2 kg)   SpO2 96%   BMI 33.66 kg/m    EXAM: General: Pt appears well and is in NAD  Lungs: Clear with good BS bilat   Heart: Auscultation: RRR    LE: Trace pitting edema   Mental Status: Judgment, insight: intact Mood and affect: no depression, anxiety, or agitation    DM Foot exam 11/25/2023  The skin of the feet is intact without sores or ulcerations. The pedal pulses are 2+ on right and 2+ on left. The sensation is intact to a screening 5.07, 10 gram monofilament bilaterally  DATA REVIEWED:  Lab Results  Component Value Date   HGBA1C 9.2 (A) 11/25/2023   HGBA1C 9.2 (A) 06/03/2023   HGBA1C 8.7 (H) 11/26/2022    Latest Reference Range & Units 11/25/23 08:49  Sodium 135 - 146 mmol/L 140  Potassium 3.5 - 5.3 mmol/L 3.7  Chloride 98 - 110 mmol/L 102  CO2 20 - 32 mmol/L 28  Glucose 65 - 99 mg/dL 773 (H)  BUN 7 - 25 mg/dL 17  Creatinine 9.49 - 8.94 mg/dL 9.17  Calcium  8.6 - 10.4 mg/dL 89.3 (H)  BUN/Creatinine Ratio 6 - 22 (calc) SEE NOTE:  eGFR > OR = 60 mL/min/1.19m2 79  Total CHOL/HDL Ratio <5.0 (calc) 3.7  Cholesterol <200 mg/dL 782 (H)  HDL Cholesterol > OR = 50 mg/dL 59  LDL Cholesterol (Calc) mg/dL (calc) 867 (H)  MICROALB/CREAT RATIO <30 mg/g creat 21  Non-HDL Cholesterol (Calc) <130 mg/dL (calc) 841 (H)  Triglycerides <150 mg/dL 854    Latest Reference Range & Units 11/25/23 08:49  TSH 0.40 - 4.50 mIU/L 1.09  Microalb, Ur mg/dL 3.3  MICROALB/CREAT RATIO <30 mg/g creat 21  Creatinine, Urine 20 - 275 mg/dL 840      Old records , labs and images have been reviewed.    ASSESSMENT / PLAN / RECOMMENDATIONS:   1) Type 2 Diabetes Mellitus, Poorly controlled, Without complications - Most recent A1c of 9.2 %. Goal A1c < 7.0 %.    -  Laura Marsh continues with worsening glycemic control due to imperfect adherence to medication -I have again suggested basal insulin, but the Laura Marsh declines at this time - Laura Marsh would like to switch Mounjaro  to Ozempic  as she believes she had better outcome with Ozempic , will change. - In her history  it is indicated that she is intolerant to higher doses of metformin , but the Laura Marsh states she would like to go back on regular metformin  and increase the dose, caution against GI side effects. -Intolerant to Farxiga  -I had to change her glipizide  from regular to XL, because she was having difficulty taking it twice daily, Laura Marsh because she is unable to take glipizide  2 tablets a day due to constipation but able to tolerate 1 tablet a day -Not a candidate for pioglitazone due to lower extremity edema   MEDICATIONS:   Change metformin  1000 mg twice daily Continue glipizide  10 mg XL, 1 tablet daily  Switch Mounjaro  to Ozempic  2 mg weekly       EDUCATION / INSTRUCTIONS: BG monitoring instructions: Laura Marsh is instructed to check her blood sugars 2 times a day, fasting and bedtime. Call [facility] if: BG persistently < 70  I reviewed the Rule of 15 for the treatment of hypoglycemia in detail with the Laura Marsh. Literature supplied.  2) Diabetic complications:  Eye: Does not have known diabetic retinopathy.  Neuro/ Feet: Does not have known diabetic peripheral neuropathy. Renal: Laura Marsh does not have known baseline CKD. She is  on an ACEI/ARB at present.  Micro albumin urea testing is normal and up-to-date  3) Dyslipidemia :   - LDL remains above goal, will increase atorvastatin  as below  -Will encourage low-fat diet  Medication Stop atorvastatin  20 mg daily Start atorvastatin  40 mg daily  F/U in  3 months    Signed electronically by: Stefano Redgie Butts, MD  [facility]  [facility] [address] [place], [place]  [address] Phone: [phone] FAX: [phone]   CC: Mercer Clotilda SAUNDERS, MD [phone]  [place] [address] Phone: [phone]  Fax: [phone]  Return to Endocrinology clinic as below: Future Appointments  Date Time Provider Department Center  12/02/2023 10:00 AM Mercer Clotilda SAUNDERS, MD [id number]

## 2023-11-25 ENCOUNTER — Ambulatory Visit: Admitting: Internal Medicine

## 2023-11-25 ENCOUNTER — Other Ambulatory Visit (HOSPITAL_COMMUNITY): Payer: Self-pay

## 2023-11-25 ENCOUNTER — Other Ambulatory Visit: Payer: Self-pay

## 2023-11-25 ENCOUNTER — Encounter: Payer: Self-pay | Admitting: Internal Medicine

## 2023-11-25 VITALS — BP 120/82 | HR 80 | Ht 63.0 in | Wt 190.0 lb

## 2023-11-25 DIAGNOSIS — Z7984 Long term (current) use of oral hypoglycemic drugs: Secondary | ICD-10-CM

## 2023-11-25 DIAGNOSIS — E1165 Type 2 diabetes mellitus with hyperglycemia: Secondary | ICD-10-CM | POA: Diagnosis not present

## 2023-11-25 DIAGNOSIS — E785 Hyperlipidemia, unspecified: Secondary | ICD-10-CM

## 2023-11-25 DIAGNOSIS — Z7985 Long-term (current) use of injectable non-insulin antidiabetic drugs: Secondary | ICD-10-CM | POA: Diagnosis not present

## 2023-11-25 LAB — BASIC METABOLIC PANEL WITH GFR
BUN: 17 mg/dL (ref 7–25)
CO2: 28 mmol/L (ref 20–32)
Calcium: 10.6 mg/dL — ABNORMAL HIGH (ref 8.6–10.4)
Chloride: 102 mmol/L (ref 98–110)
Creat: 0.82 mg/dL (ref 0.50–1.05)
Glucose, Bld: 226 mg/dL — ABNORMAL HIGH (ref 65–99)
Potassium: 3.7 mmol/L (ref 3.5–5.3)
Sodium: 140 mmol/L (ref 135–146)
eGFR: 79 mL/min/1.73m2 (ref >=60)

## 2023-11-25 LAB — LIPID PANEL
Cholesterol: 217 mg/dL — ABNORMAL HIGH (ref <200)
HDL: 59 mg/dL (ref >=50)
LDL Cholesterol (Calc): 132 mg/dL — ABNORMAL HIGH
Non-HDL Cholesterol (Calc): 158 mg/dL — ABNORMAL HIGH (ref <130)
Total CHOL/HDL Ratio: 3.7 (calc) (ref <5.0)
Triglycerides: 145 mg/dL (ref <150)

## 2023-11-25 LAB — POCT GLYCOSYLATED HEMOGLOBIN (HGB A1C): Hemoglobin A1C: 9.2 % — AB (ref 4.0–5.6)

## 2023-11-25 LAB — POCT GLUCOSE (DEVICE FOR HOME USE): Glucose Fasting, POC: 260 mg/dL — AB (ref 70–99)

## 2023-11-25 LAB — MICROALBUMIN / CREATININE URINE RATIO
Creatinine, Urine: 159 mg/dL (ref 20–275)
Microalb Creat Ratio: 21 mg/g{creat} (ref <30)
Microalb, Ur: 3.3 mg/dL

## 2023-11-25 LAB — TSH: TSH: 1.09 m[IU]/L (ref 0.40–4.50)

## 2023-11-25 MED ORDER — GLIPIZIDE ER 10 MG PO TB24
10.0000 mg | ORAL_TABLET | Freq: Every day | ORAL | 3 refills | Status: DC
  Filled 2023-11-25 – 2024-02-18 (×3): qty 90, 90d supply, fill #0
  Filled 2024-04-12 – 2024-04-13 (×2): qty 90, 90d supply, fill #1

## 2023-11-25 MED ORDER — METFORMIN HCL 1000 MG PO TABS
1000.0000 mg | ORAL_TABLET | Freq: Two times a day (BID) | ORAL | 3 refills | Status: DC
  Filled 2023-11-25: qty 26, 13d supply, fill #0
  Filled 2023-11-25: qty 180, 90d supply, fill #0
  Filled 2023-11-25: qty 154, 77d supply, fill #0
  Filled 2024-03-17 – 2024-04-12 (×2): qty 180, 90d supply, fill #1

## 2023-11-25 MED ORDER — SEMAGLUTIDE (2 MG/DOSE) 8 MG/3ML ~~LOC~~ SOPN
2.0000 mg | PEN_INJECTOR | SUBCUTANEOUS | 3 refills | Status: DC
  Filled 2023-11-25: qty 3, 28d supply, fill #0
  Filled 2023-12-30: qty 3, 28d supply, fill #1
  Filled 2024-02-17: qty 3, 28d supply, fill #2
  Filled 2024-03-20: qty 3, 28d supply, fill #3
  Filled 2024-04-12: qty 3, 28d supply, fill #4

## 2023-11-25 NOTE — Patient Instructions (Addendum)
-   Change Metformin  1000 mg , twice daily  - Take glipizide  10 mg XL, 1 tablet daily - Switch Mounjaro  to Ozempic  2 mg weekly    HOW TO TREAT LOW BLOOD SUGARS (Blood sugar LESS THAN 70 MG/DL) Please follow the RULE OF 15 for the treatment of hypoglycemia treatment (when your (blood sugars are less than 70 mg/dL)   STEP 1: Take 15 grams of carbohydrates when your blood sugar is low, which includes:  3-4 GLUCOSE TABS  OR 3-4 OZ OF JUICE OR REGULAR SODA OR ONE TUBE OF GLUCOSE GEL    STEP 2: RECHECK blood sugar in 15 MINUTES STEP 3: If your blood sugar is still low at the 15 minute recheck --> then, go back to STEP 1 and treat AGAIN with another 15 grams of carbohydrates.

## 2023-11-26 ENCOUNTER — Other Ambulatory Visit (HOSPITAL_COMMUNITY): Payer: Self-pay

## 2023-11-28 ENCOUNTER — Ambulatory Visit: Payer: Self-pay | Admitting: Internal Medicine

## 2023-11-28 ENCOUNTER — Other Ambulatory Visit (HOSPITAL_COMMUNITY): Payer: Self-pay

## 2023-11-28 MED ORDER — ATORVASTATIN CALCIUM 40 MG PO TABS
40.0000 mg | ORAL_TABLET | Freq: Every day | ORAL | 3 refills | Status: AC
Start: 2023-11-28 — End: ?
  Filled 2023-11-28: qty 90, 90d supply, fill #0

## 2023-11-29 ENCOUNTER — Other Ambulatory Visit (HOSPITAL_COMMUNITY): Payer: Self-pay

## 2023-12-02 ENCOUNTER — Ambulatory Visit: Admitting: Family Medicine

## 2023-12-02 ENCOUNTER — Encounter: Payer: Self-pay | Admitting: Family Medicine

## 2023-12-02 VITALS — BP 118/70 | HR 105 | Temp 97.9°F | Ht 63.0 in | Wt 189.2 lb

## 2023-12-02 DIAGNOSIS — Z6833 Body mass index (BMI) 33.0-33.9, adult: Secondary | ICD-10-CM | POA: Diagnosis not present

## 2023-12-02 DIAGNOSIS — Z780 Asymptomatic menopausal state: Secondary | ICD-10-CM | POA: Diagnosis not present

## 2023-12-02 DIAGNOSIS — E1169 Type 2 diabetes mellitus with other specified complication: Secondary | ICD-10-CM

## 2023-12-02 DIAGNOSIS — E782 Mixed hyperlipidemia: Secondary | ICD-10-CM

## 2023-12-02 DIAGNOSIS — I10 Essential (primary) hypertension: Secondary | ICD-10-CM | POA: Diagnosis not present

## 2023-12-02 DIAGNOSIS — Z0001 Encounter for general adult medical examination with abnormal findings: Secondary | ICD-10-CM | POA: Diagnosis not present

## 2023-12-02 DIAGNOSIS — R600 Localized edema: Secondary | ICD-10-CM | POA: Diagnosis not present

## 2023-12-02 DIAGNOSIS — E66811 Obesity, class 1: Secondary | ICD-10-CM

## 2023-12-02 DIAGNOSIS — Z0000 Encounter for general adult medical examination without abnormal findings: Secondary | ICD-10-CM

## 2023-12-02 NOTE — Progress Notes (Signed)
 Established Patient Office Visit   Subjective  Patient ID: Laura Marsh, female    DOB: 02-12-1956  Age: 68 y.o. MRN: [id number]  Chief Complaint  Patient presents with   Annual Exam    Pt is a 68 yo female seen for CPE and f/u on chronic conditions.  Pt not sure DM meds are working.  Saw Endo 8/22.  Meds changed.  Pt feels like glipizide  xl 10 mg BID is causing constipation.  Pt typically tries the med for 4 days then stops due to it.  Metformin  ER 1000 mg BID causes nausea after eating.  On Mounjaro  but feels like it causes yeast infections.  Pt to switch to Ozempic  but has a few mounjaro  pens left.  Pt not exercising outside of work.  May garden or do housework.  Drinking 2-3 glasses of water per day.  Had 1/2 a burger with bacon and cheese, fries, and a small piece of cake for dinner.  Had spaghetti and unsweetened apple sauce for lunch this wk.  Still having LE edema.  Started in her 98s prior to being on norvasc .    Patient Active Problem List   Diagnosis Date Noted   Dyslipidemia 12/31/2022   Flu-like symptoms 04/02/2021   Hyperlipidemia associated with type 2 diabetes mellitus (HCC) 08/25/2017   Ganglion cyst of wrist 12/25/2014   De Quervain's disease (tenosynovitis) 06/25/2014   Hypertension associated with diabetes (HCC) 04/13/2012   Diabetes (HCC) 04/13/2012   Past Medical History:  Diagnosis Date   Anxiety 2012   Colon polyp    Diabetes mellitus    Elevated cholesterol    Hypertension 2012   Past Surgical History:  Procedure Laterality Date   ABDOMINAL HYSTERECTOMY     COLONOSCOPY     COLONOSCOPY N/A 09/15/2022   Procedure: COLONOSCOPY;  Surgeon: Toledo, Ladell POUR, MD;  Location: [facility];  Service: Gastroenterology;  Laterality: N/A;   COLONOSCOPY WITH PROPOFOL  N/A 11/08/2017   Procedure: COLONOSCOPY WITH PROPOFOL ;  Surgeon: Toledo, Ladell POUR, MD;  Location: [facility];  Service: Gastroenterology;  Laterality: N/A;   COLONOSCOPY WITH PROPOFOL  N/A  04/28/2020   Procedure: COLONOSCOPY WITH PROPOFOL ;  Surgeon: Tye Millet, DO;  Location: [facility];  Service: General;  Laterality: N/A;   Social History   Tobacco Use   Smoking status: Never   Smokeless tobacco: Never  Vaping Use   Vaping status: Never Used  Substance Use Topics   Alcohol  use: Yes    Comment: rarely   Drug use: No   Family History  Problem Relation Age of Onset   Diabetes Mother    Congestive Heart Failure Mother    Allergies  Allergen Reactions   Trulicity  [Dulaglutide ] Itching    Rash/itching at injection site    ROS Negative unless stated above    Objective:     BP 118/70   Pulse (!) 105   Temp 97.9 F (36.6 C) (Oral)   Ht 5' 3 (1.6 m)   Wt 189 lb 4 oz (85.8 kg)   SpO2 97%   BMI 33.52 kg/m  BP Readings from Last 3 Encounters:  12/02/23 118/70  11/25/23 120/82  09/16/23 120/66   Wt Readings from Last 3 Encounters:  12/02/23 189 lb 4 oz (85.8 kg)  11/25/23 190 lb (86.2 kg)  09/16/23 194 lb 9.6 oz (88.3 kg)      Physical Exam Constitutional:      Appearance: Normal appearance.  HENT:     Head: Normocephalic and atraumatic.  Right Ear: Tympanic membrane, ear canal and external ear normal.     Left Ear: Tympanic membrane, ear canal and external ear normal.     Nose: Nose normal.     Mouth/Throat:     Mouth: Mucous membranes are moist.     Pharynx: No oropharyngeal exudate or posterior oropharyngeal erythema.  Eyes:     General: No scleral icterus.    Extraocular Movements: Extraocular movements intact.     Conjunctiva/sclera: Conjunctivae normal.     Pupils: Pupils are equal, round, and reactive to light.  Neck:     Thyroid : No thyromegaly.     Vascular: No carotid bruit.  Cardiovascular:     Rate and Rhythm: Normal rate and regular rhythm.     Pulses: Normal pulses.     Heart sounds: Normal heart sounds. No murmur heard.    No friction rub.  Pulmonary:     Effort: Pulmonary effort is normal.     Breath sounds:  Normal breath sounds. No wheezing, rhonchi or rales.  Abdominal:     General: Bowel sounds are normal.     Palpations: Abdomen is soft.     Tenderness: There is no abdominal tenderness.  Musculoskeletal:        General: No deformity. Normal range of motion.     Right lower leg: 1+ Edema present.     Left lower leg: 1+ Edema present.  Lymphadenopathy:     Cervical: No cervical adenopathy.  Skin:    General: Skin is warm and dry.     Findings: No lesion.  Neurological:     General: No focal deficit present.     Mental Status: She is alert and oriented to person, place, and time.  Psychiatric:        Mood and Affect: Mood normal.        Thought Content: Thought content normal.        09/16/2023   11:42 AM 05/06/2023   11:29 AM 11/26/2022    9:46 AM  Depression screen PHQ 2/9  Decreased Interest 0 0 0  Down, Depressed, Hopeless 0 0 0  PHQ - 2 Score 0 0 0  Altered sleeping 0 0   Tired, decreased energy 0 0   Change in appetite 0 0   Feeling bad or failure about yourself  0 0   Trouble concentrating 0 0   Moving slowly or fidgety/restless 0 0   Suicidal thoughts 0 0   PHQ-9 Score 0 0   Difficult doing work/chores Not difficult at all Not difficult at all       09/16/2023   11:42 AM 05/06/2023   11:29 AM 11/26/2022    9:46 AM 12/12/2020    9:57 AM  GAD 7 : Generalized Anxiety Score  Nervous, Anxious, on Edge 0 0 0 0  Control/stop worrying 0 1 0 0  Worry too much - different things 0 0 0 0  Trouble relaxing 3 0 0 0  Restless 0 0 0 0  Easily annoyed or irritable 0 0 0 0  Afraid - awful might happen 0 0 0 0  Total GAD 7 Score 3 1 0 0  Anxiety Difficulty Not difficult at all Not difficult at all Not difficult at all      Results for orders placed or performed in visit on 12/02/23  HM PAP SMEAR  Result Value Ref Range   HM Pap smear normal       Assessment & Plan:  Well adult exam  Type 2 diabetes mellitus with other specified complication, without long-term  current use of insulin (HCC) -     Amb Referral to Nutrition and Diabetic Education  Essential hypertension  Bilateral lower extremity edema  Mixed hyperlipidemia  Class 1 obesity with serious comorbidity and body mass index (BMI) of 33.0 to 33.9 in adult, unspecified obesity type -     Amb Referral to Nutrition and Diabetic Education  Encounter for osteoporosis screening in asymptomatic postmenopausal patient -     DG Bone Density; Future  Age appropriate health screenings discussed.  Labs reviewed from 11/25/23 at Endo.  Mammogram scheduled for 01/2024/  Sigmoidoscopy scheduled for 01/2024.  Pap up to date.  Eye exam with [facility] in Oct 2025.  Immunizations reviewed.  Consider influenza and pna vaccine.  Strongly encouraged to make lifestyle modifications including diet changes and exercise.  Referral to Nutrition placed.  Foot exam done 11/25/23.  Bone density scan ordered.  Supportive care for LE edema including elevating LEs, compression socks and decreasing sodium intake.  Return in about 3 months (around 03/03/2024).   Clotilda JONELLE Single, MD

## 2023-12-08 ENCOUNTER — Other Ambulatory Visit (HOSPITAL_COMMUNITY): Payer: Self-pay

## 2023-12-30 ENCOUNTER — Other Ambulatory Visit (HOSPITAL_COMMUNITY): Payer: Self-pay

## 2023-12-30 ENCOUNTER — Other Ambulatory Visit: Payer: Self-pay | Admitting: Family Medicine

## 2023-12-30 DIAGNOSIS — I10 Essential (primary) hypertension: Secondary | ICD-10-CM

## 2023-12-30 MED ORDER — LISINOPRIL 30 MG PO TABS
30.0000 mg | ORAL_TABLET | Freq: Every day | ORAL | 3 refills | Status: AC
  Filled 2023-12-30: qty 90, 90d supply, fill #0
  Filled 2024-03-17: qty 90, 90d supply, fill #1
  Filled 2024-05-06: qty 90, 90d supply, fill #2

## 2023-12-30 MED ORDER — AMLODIPINE BESYLATE 5 MG PO TABS
5.0000 mg | ORAL_TABLET | Freq: Every day | ORAL | 3 refills | Status: AC
  Filled 2023-12-30 – 2024-02-04 (×2): qty 90, 90d supply, fill #0
  Filled 2024-05-06: qty 90, 90d supply, fill #1

## 2024-01-06 ENCOUNTER — Other Ambulatory Visit (HOSPITAL_COMMUNITY): Payer: Self-pay

## 2024-01-06 DIAGNOSIS — Z1331 Encounter for screening for depression: Secondary | ICD-10-CM | POA: Diagnosis not present

## 2024-01-06 DIAGNOSIS — Z1231 Encounter for screening mammogram for malignant neoplasm of breast: Secondary | ICD-10-CM | POA: Diagnosis not present

## 2024-01-06 DIAGNOSIS — Z01419 Encounter for gynecological examination (general) (routine) without abnormal findings: Secondary | ICD-10-CM | POA: Diagnosis not present

## 2024-01-06 LAB — HM MAMMOGRAPHY

## 2024-01-06 MED ORDER — ESTRADIOL 0.1 MG/GM VA CREA
1.0000 g | TOPICAL_CREAM | VAGINAL | 3 refills | Status: AC
  Filled 2024-01-06: qty 42.5, 90d supply, fill #0

## 2024-01-06 MED ORDER — NYSTATIN-TRIAMCINOLONE 100000-0.1 UNIT/GM-% EX OINT
1.0000 | TOPICAL_OINTMENT | Freq: Two times a day (BID) | CUTANEOUS | 2 refills | Status: AC
  Filled 2024-01-06: qty 30, 15d supply, fill #0
  Filled 2024-02-17: qty 30, 15d supply, fill #1
  Filled 2024-04-12: qty 30, 15d supply, fill #2

## 2024-01-20 ENCOUNTER — Other Ambulatory Visit (HOSPITAL_COMMUNITY): Payer: Self-pay

## 2024-01-20 ENCOUNTER — Ambulatory Visit: Admitting: Family Medicine

## 2024-01-20 ENCOUNTER — Encounter: Payer: Self-pay | Admitting: Family Medicine

## 2024-01-20 VITALS — BP 118/80 | HR 96 | Temp 97.6°F | Ht 63.0 in | Wt 193.8 lb

## 2024-01-20 DIAGNOSIS — L304 Erythema intertrigo: Secondary | ICD-10-CM

## 2024-01-20 DIAGNOSIS — B029 Zoster without complications: Secondary | ICD-10-CM | POA: Diagnosis not present

## 2024-01-20 MED ORDER — VALACYCLOVIR HCL 1 G PO TABS
1000.0000 mg | ORAL_TABLET | Freq: Three times a day (TID) | ORAL | 0 refills | Status: AC
  Filled 2024-01-20: qty 21, 7d supply, fill #0

## 2024-01-20 MED ORDER — GABAPENTIN 100 MG PO CAPS
100.0000 mg | ORAL_CAPSULE | Freq: Three times a day (TID) | ORAL | 0 refills | Status: AC | PRN
  Filled 2024-01-20: qty 30, 10d supply, fill #0

## 2024-01-20 MED ORDER — NYSTATIN 100000 UNIT/GM EX CREA
1.0000 | TOPICAL_CREAM | Freq: Two times a day (BID) | CUTANEOUS | 0 refills | Status: AC
  Filled 2024-01-20: qty 30, 15d supply, fill #0

## 2024-01-20 NOTE — Progress Notes (Signed)
 Established Patient Office Visit   Subjective  Patient ID: Laura Marsh, female    DOB: September 18, 1955  Age: 68 y.o. MRN: [id number]  Chief Complaint  Patient presents with   Rash    Rash on chest, stomach and leg (right side) since Monday/Tuesday, some mild pain some appearance of blister-like on Tuesday     Pt is a 68 yo female seen for acute concern of rash.  Pt with rash on R side of chest, stomach and upper leg. Noticed it on Monday or Tuesday.  Endorses mild pain 2/10.  Turned into blisters Tuesday.  Denies fever, chills, changes in medicines or products, sick contacts.  H/o chkn pox as a child.  No improvement with supportive care.    Patient Active Problem List   Diagnosis Date Noted   Dyslipidemia 12/31/2022   Flu-like symptoms 04/02/2021   Hyperlipidemia associated with type 2 diabetes mellitus (HCC) 08/25/2017   Ganglion cyst of wrist 12/25/2014   De Quervain's disease (tenosynovitis) 06/25/2014   Hypertension associated with diabetes (HCC) 04/13/2012   Diabetes (HCC) 04/13/2012   Past Medical History:  Diagnosis Date   Anxiety 2012   Colon polyp    Diabetes mellitus    Elevated cholesterol    Hypertension 2012   Past Surgical History:  Procedure Laterality Date   ABDOMINAL HYSTERECTOMY     COLONOSCOPY     COLONOSCOPY N/A 09/15/2022   Procedure: COLONOSCOPY;  Surgeon: Toledo, Ladell POUR, MD;  Location: [facility];  Service: Gastroenterology;  Laterality: N/A;   COLONOSCOPY WITH PROPOFOL  N/A 11/08/2017   Procedure: COLONOSCOPY WITH PROPOFOL ;  Surgeon: Toledo, Ladell POUR, MD;  Location: [facility];  Service: Gastroenterology;  Laterality: N/A;   COLONOSCOPY WITH PROPOFOL  N/A 04/28/2020   Procedure: COLONOSCOPY WITH PROPOFOL ;  Surgeon: Tye Millet, DO;  Location: [facility];  Service: General;  Laterality: N/A;   Social History   Tobacco Use   Smoking status: Never   Smokeless tobacco: Never  Vaping Use   Vaping status: Never Used  Substance Use  Topics   Alcohol  use: Yes    Comment: rarely   Drug use: No   Family History  Problem Relation Age of Onset   Diabetes Mother    Congestive Heart Failure Mother    Allergies  Allergen Reactions   Trulicity  [Dulaglutide ] Itching    Rash/itching at injection site    ROS Negative unless stated above    Objective:     BP 118/80 (BP Location: Left Arm, Patient Position: Sitting, Cuff Size: Large)   Pulse 96   Temp 97.6 F (36.4 C) (Temporal)   Ht 5' 3 (1.6 m)   Wt 193 lb 12.8 oz (87.9 kg)   SpO2 96%   BMI 34.33 kg/m  BP Readings from Last 3 Encounters:  01/20/24 118/80  12/02/23 118/70  11/25/23 120/82   Wt Readings from Last 3 Encounters:  01/20/24 193 lb 12.8 oz (87.9 kg)  12/02/23 189 lb 4 oz (85.8 kg)  11/25/23 190 lb (86.2 kg)      Physical Exam Constitutional:      General: She is not in acute distress.    Appearance: Normal appearance.  HENT:     Head: Normocephalic and atraumatic.     Nose: Nose normal.     Mouth/Throat:     Mouth: Mucous membranes are moist.  Cardiovascular:     Rate and Rhythm: Normal rate and regular rhythm.     Heart sounds: Normal heart sounds. No murmur heard.  No gallop.  Pulmonary:     Effort: Pulmonary effort is normal. No respiratory distress.     Breath sounds: Normal breath sounds. No wheezing, rhonchi or rales.  Skin:    General: Skin is warm and dry.     Comments: Erythematous rash R chest and abd with pustules in varying stages.  Rash underneath R breast and and sternum with satellite lesions.  Neurological:     Mental Status: She is alert and oriented to person, place, and time.        09/16/2023   11:42 AM 05/06/2023   11:29 AM 11/26/2022    9:46 AM  Depression screen PHQ 2/9  Decreased Interest 0 0 0  Down, Depressed, Hopeless 0 0 0  PHQ - 2 Score 0 0 0  Altered sleeping 0 0   Tired, decreased energy 0 0   Change in appetite 0 0   Feeling bad or failure about yourself  0 0   Trouble concentrating 0  0   Moving slowly or fidgety/restless 0 0   Suicidal thoughts 0 0   PHQ-9 Score 0 0   Difficult doing work/chores Not difficult at all Not difficult at all       09/16/2023   11:42 AM 05/06/2023   11:29 AM 11/26/2022    9:46 AM 12/12/2020    9:57 AM  GAD 7 : Generalized Anxiety Score  Nervous, Anxious, on Edge 0 0 0 0  Control/stop worrying 0 1 0 0  Worry too much - different things 0 0 0 0  Trouble relaxing 3 0 0 0  Restless 0 0 0 0  Easily annoyed or irritable 0 0 0 0  Afraid - awful might happen 0 0 0 0  Total GAD 7 Score 3 1 0 0  Anxiety Difficulty Not difficult at all Not difficult at all Not difficult at all      No results found for any visits on 01/20/24.    Assessment & Plan:   Herpes zoster without complication -     valACYclovir HCl; Take 1 tablet (1,000 mg total) by mouth 3 (three) times daily for 7 days.  Dispense: 21 tablet; Refill: 0 -     Gabapentin; Take 1 capsule (100 mg total) by mouth 3 (three) times daily as needed (pain, burning of rash).  Dispense: 30 capsule; Refill: 0  Intertrigo -     Nystatin ; Apply 1 Application topically 2 (two) times daily.  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 Discussed r/b/a.  Rx for antiviral and gabapentin.  Nystatin  for intertrigo underneath breast and on sternum.  Supportive care including keeping skin dry.  Given precautions.  Return if symptoms worsen or fail to improve.   Clotilda JONELLE Single, MD

## 2024-02-04 ENCOUNTER — Other Ambulatory Visit (HOSPITAL_COMMUNITY): Payer: Self-pay

## 2024-02-14 DIAGNOSIS — K621 Rectal polyp: Secondary | ICD-10-CM | POA: Diagnosis not present

## 2024-02-17 ENCOUNTER — Other Ambulatory Visit (HOSPITAL_COMMUNITY): Payer: Self-pay

## 2024-02-17 DIAGNOSIS — D3132 Benign neoplasm of left choroid: Secondary | ICD-10-CM | POA: Diagnosis not present

## 2024-02-17 DIAGNOSIS — H2513 Age-related nuclear cataract, bilateral: Secondary | ICD-10-CM | POA: Diagnosis not present

## 2024-02-17 DIAGNOSIS — E119 Type 2 diabetes mellitus without complications: Secondary | ICD-10-CM | POA: Diagnosis not present

## 2024-02-17 DIAGNOSIS — H524 Presbyopia: Secondary | ICD-10-CM | POA: Diagnosis not present

## 2024-02-17 DIAGNOSIS — H04123 Dry eye syndrome of bilateral lacrimal glands: Secondary | ICD-10-CM | POA: Diagnosis not present

## 2024-02-17 DIAGNOSIS — H52223 Regular astigmatism, bilateral: Secondary | ICD-10-CM | POA: Diagnosis not present

## 2024-02-17 LAB — OPHTHALMOLOGY REPORT-SCANNED

## 2024-02-18 ENCOUNTER — Other Ambulatory Visit (HOSPITAL_COMMUNITY): Payer: Self-pay

## 2024-02-23 NOTE — Progress Notes (Deleted)
 Name: Laura Marsh  Age/ Sex: 68 y.o., female   MRN/ DOB: [id number], 1955-10-17     PCP: Mercer Clotilda SAUNDERS, MD   Reason for Endocrinology Evaluation: Type 2 Diabetes Mellitus     Initial Endocrinology Clinic Visit: 06/16/2018    PATIENT IDENTIFIER: Laura Marsh is a 68 y.o. female with a past medical history of HTN, T2DM and Dyslipidemia. The patient has followed with Endocrinology clinic since 06/16/2018 for consultative assistance with management of her diabetes.  DIABETIC HISTORY:  Laura Marsh was diagnosed with T2DM ~ 2014. She has been on oral glycemic agents for years. She has never been on insulin therapy. Her hemoglobin A1c has ranged from 6.5% in 2014, peaking at 11.0% in 2019.   On her initial visit to our clinic she was on Metformin , Glipizide  and Januvia , with an A1c 9.7 %.  We stopped Januvia  and glipizide  and started Ozempic  with continuation of Metformin     [facility]  I have attempted to screen her for Cushing's syndrome due to spontaneous hypokalemia and weight gain in 06/2020 but she did not return the urine sample.    Stopped Farxiga  01/2022 due to recurrent yeast infections   SUBJECTIVE:   During the last visit (11/25/2023): A1c 9.2% .     Today (02/23/2024): Laura Marsh is here for a follow up on diabetes follow up.  She has not been checking glucose at home.    She went excision of rectal polyp under anesthesia, due to significant scarring.Healing properly   Denies nausea or vomiting  Continues with constipation alternating with diarrhea LE edema is better  She has been taking glycemic agents intermittently for the past month     HOME DIABETES REGIMEN:  Metformin  1000 mg BID Glipizide  10 mg XL ,1 tablet  daily Ozempic  2 mg weekly Atorvastatin  40 mg daily     GLUCOSE LOG   N/a   DIABETIC COMPLICATIONS: Microvascular complications:   Denies: CKD, retinopathy, neuropathy  Last eye exam:  Completed 11/2022    Macrovascular complications: Denies: CAD, PVD, CVA    HISTORY:  Past Medical History:  Past Medical History:  Diagnosis Date   Anxiety 2012   Colon polyp    Diabetes mellitus    Elevated cholesterol    Hypertension 2012   Past Surgical History:  Past Surgical History:  Procedure Laterality Date   ABDOMINAL HYSTERECTOMY     COLONOSCOPY     COLONOSCOPY N/A 09/15/2022   Procedure: COLONOSCOPY;  Surgeon: Toledo, Ladell POUR, MD;  Location: [facility];  Service: Gastroenterology;  Laterality: N/A;   COLONOSCOPY WITH PROPOFOL  N/A 11/08/2017   Procedure: COLONOSCOPY WITH PROPOFOL ;  Surgeon: Toledo, Ladell POUR, MD;  Location: [facility];  Service: Gastroenterology;  Laterality: N/A;   COLONOSCOPY WITH PROPOFOL  N/A 04/28/2020   Procedure: COLONOSCOPY WITH PROPOFOL ;  Surgeon: Tye Millet, DO;  Location: [facility];  Service: General;  Laterality: N/A;   Social History:  reports that she has never smoked. She has never used smokeless tobacco. She reports current alcohol  use. She reports that she does not use drugs. Family History:  Family History  Problem Relation Age of Onset   Diabetes Mother    Congestive Heart Failure Mother      HOME MEDICATIONS: Allergies as of 02/24/2024       Reactions   Trulicity  [dulaglutide ] Itching   Rash/itching at injection site        Medication List  Accurate as of February 23, 2024  1:23 PM. If you have any questions, ask your nurse or doctor.          albuterol  108 (90 Base) MCG/ACT inhaler Commonly known as: VENTOLIN  HFA Inhale 1-2 puffs into the lungs every 6 (six) hours as needed for wheezing or shortness of breath.   Alcohol  Swabs  Pads Use as directed.   amLODipine  5 MG tablet Commonly known as: NORVASC  Take 1 tablet (5 mg total) by mouth daily.   aspirin EC 81 MG tablet Take 81 mg by mouth daily. Swallow whole. Taken it twice a week What changed:  when to take this reasons to take  this   atorvastatin  40 MG tablet Commonly known as: LIPITOR Take 1 tablet (40 mg total) by mouth daily.   estradiol  0.1 MG/GM vaginal cream Commonly known as: ESTRACE  Place 1 g vaginally 2 (two) times a week.   fluconazole  150 MG tablet Commonly known as: DIFLUCAN  Take 1 tablet by mouth now.  Repeat dose in 3 days.   fluticasone  50 MCG/ACT nasal spray Commonly known as: FLONASE  Place 2 sprays into both nostrils daily.   FREESTYLE LITE test strip Generic drug: glucose blood Use to test blood sugar 2 (two) times daily.   gabapentin  100 MG capsule Commonly known as: NEURONTIN  Take 1 capsule (100 mg total) by mouth 3 (three) times daily as needed (pain, burning of rash).   glipiZIDE  10 MG 24 hr tablet Commonly known as: glipiZIDE  XL Take 1 tablet (10 mg total) by mouth daily with breakfast.   lisinopril  30 MG tablet Commonly known as: ZESTRIL  Take 1 tablet (30 mg total) by mouth daily.   loratadine 10 MG tablet Commonly known as: CLARITIN Take 10 mg by mouth daily as needed.   METAMUCIL FIBER PO Take by mouth in the morning and at bedtime. What changed: when to take this   metFORMIN  1000 MG tablet Commonly known as: GLUCOPHAGE  Take 1 tablet (1,000 mg total) by mouth 2 (two) times daily with a meal.   multivitamin with minerals Tabs tablet Take 1 tablet by mouth daily.   Na Sulfate-K Sulfate-Mg Sulfate concentrate 17.5-3.13-1.6 GM/177ML Soln Commonly known as: SUPREP Take 2 Bottles (1 kit total) by mouth as directed One kit contains 2 bottles.  Follow the [facility] Preparation Instructions.   Na Sulfate-K Sulfate-Mg Sulfate concentrate 17.5-3.13-1.6 GM/177ML Soln Commonly known as: SUPREP Take 2 Bottles (1 kit total) by mouth as directed. Follow the [facility] Preparation Instructions.   Na Sulfate-K Sulfate-Mg Sulfate concentrate 17.5-3.13-1.6 GM/177ML Soln Commonly known as: SUPREP Take by mouth.   nystatin  cream Commonly known as: MYCOSTATIN  Apply topically 2  (two) times daily.   nystatin -triamcinolone  ointment Commonly known as: MYCOLOG Apply 1 Application topically to affected areas 2 (two) times daily.   Ozempic  (2 MG/DOSE) 8 MG/3ML Sopn Generic drug: Semaglutide  (2 MG/DOSE) Inject 2 mg as directed once a week.   triamterene -hydrochlorothiazide  37.5-25 MG tablet Commonly known as: MAXZIDE -25 Take 1 tablet by mouth daily.      PHYSICAL EXAM: VS: There were no vitals taken for this visit.   EXAM: General: Pt appears well and is in NAD  Lungs: Clear with good BS bilat   Heart: Auscultation: RRR    LE: Trace pitting edema   Mental Status: Judgment, insight: intact Mood and affect: no depression, anxiety, or agitation    DM Foot exam 11/25/2023  The skin of the feet is intact without sores or ulcerations. The pedal pulses are 2+  on right and 2+ on left. The sensation is intact to a screening 5.07, 10 gram monofilament bilaterally    DATA REVIEWED:  Lab Results  Component Value Date   HGBA1C 9.2 (A) 11/25/2023   HGBA1C 9.2 (A) 06/03/2023   HGBA1C 8.7 (H) 11/26/2022    Latest Reference Range & Units 11/25/23 08:49  Sodium 135 - 146 mmol/L 140  Potassium 3.5 - 5.3 mmol/L 3.7  Chloride 98 - 110 mmol/L 102  CO2 20 - 32 mmol/L 28  Glucose 65 - 99 mg/dL 773 (H)  BUN 7 - 25 mg/dL 17  Creatinine 9.49 - 8.94 mg/dL 9.17  Calcium  8.6 - 10.4 mg/dL 89.3 (H)  BUN/Creatinine Ratio 6 - 22 (calc) SEE NOTE:  eGFR > OR = 60 mL/min/1.87m2 79  Total CHOL/HDL Ratio <5.0 (calc) 3.7  Cholesterol <200 mg/dL 782 (H)  HDL Cholesterol > OR = 50 mg/dL 59  LDL Cholesterol (Calc) mg/dL (calc) 867 (H)  MICROALB/CREAT RATIO <30 mg/g creat 21  Non-HDL Cholesterol (Calc) <130 mg/dL (calc) 841 (H)  Triglycerides <150 mg/dL 854    Latest Reference Range & Units 11/25/23 08:49  TSH 0.40 - 4.50 mIU/L 1.09  Microalb, Ur mg/dL 3.3  MICROALB/CREAT RATIO <30 mg/g creat 21  Creatinine, Urine 20 - 275 mg/dL 840      Old records , labs and images  have been reviewed.    ASSESSMENT / PLAN / RECOMMENDATIONS:   1) Type 2 Diabetes Mellitus, Poorly controlled, Without complications - Most recent A1c of 9.2 %. Goal A1c < 7.0 %.    - Patient continues with worsening glycemic control due to imperfect adherence to medication -I have again suggested basal insulin, but the patient declines at this time - Patient would like to switch Mounjaro  to Ozempic  as she believes she had better outcome with Ozempic , will change. - In her history  it is indicated that she is intolerant to higher doses of metformin , but the patient states she would like to go back on regular metformin  and increase the dose, caution against GI side effects. -Intolerant to Farxiga  -I had to change her glipizide  from regular to XL, because she was having difficulty taking it twice daily, patient because she is unable to take glipizide  2 tablets a day due to constipation but able to tolerate 1 tablet a day -Not a candidate for pioglitazone due to lower extremity edema   MEDICATIONS:   Change metformin  1000 mg twice daily Continue glipizide  10 mg XL, 1 tablet daily  Switch Mounjaro  to Ozempic  2 mg weekly       EDUCATION / INSTRUCTIONS: BG monitoring instructions: Patient is instructed to check her blood sugars 2 times a day, fasting and bedtime. Call [facility] if: BG persistently < 70  I reviewed the Rule of 15 for the treatment of hypoglycemia in detail with the patient. Literature supplied.  2) Diabetic complications:  Eye: Does not have known diabetic retinopathy.  Neuro/ Feet: Does not have known diabetic peripheral neuropathy. Renal: Patient does not have known baseline CKD. She is  on an ACEI/ARB at present.  Micro albumin urea testing is normal and up-to-date  3) Dyslipidemia :   - LDL remains above goal, will increase atorvastatin  as below  -Will encourage low-fat diet  Medication Stop atorvastatin  20 mg daily Start atorvastatin  40  mg daily  F/U in  3 months    Signed electronically by: Stefano Redgie Butts, MD  [facility]  [facility] [address]  [place] [address], [place] [address] Phone: [phone] FAX: [phone]   CC: Mercer Clotilda SAUNDERS, MD [address] [place] [address] Phone: [phone]  Fax: [phone]  Return to Endocrinology clinic as below: Future Appointments  Date Time Provider Department Center  02/24/2024  8:30 AM Thais Silberstein, Donell Cardinal, MD [id number] None

## 2024-02-24 ENCOUNTER — Ambulatory Visit: Admitting: Internal Medicine

## 2024-03-16 ENCOUNTER — Other Ambulatory Visit (HOSPITAL_COMMUNITY): Payer: Self-pay

## 2024-03-16 DIAGNOSIS — K621 Rectal polyp: Secondary | ICD-10-CM | POA: Diagnosis not present

## 2024-03-16 DIAGNOSIS — D128 Benign neoplasm of rectum: Secondary | ICD-10-CM | POA: Diagnosis not present

## 2024-03-16 MED ORDER — OXYCODONE HCL 5 MG PO TABS
5.0000 mg | ORAL_TABLET | ORAL | 0 refills | Status: AC | PRN
  Filled 2024-03-16: qty 10, 2d supply, fill #0

## 2024-03-16 MED ORDER — DOCUSATE SODIUM 100 MG PO CAPS
100.0000 mg | ORAL_CAPSULE | Freq: Two times a day (BID) | ORAL | 0 refills | Status: AC
  Filled 2024-03-16: qty 20, 10d supply, fill #0

## 2024-03-17 ENCOUNTER — Other Ambulatory Visit (HOSPITAL_COMMUNITY): Payer: Self-pay

## 2024-03-20 ENCOUNTER — Other Ambulatory Visit (HOSPITAL_COMMUNITY): Payer: Self-pay

## 2024-04-12 ENCOUNTER — Other Ambulatory Visit: Payer: Self-pay | Admitting: Family Medicine

## 2024-04-12 ENCOUNTER — Other Ambulatory Visit (HOSPITAL_COMMUNITY): Payer: Self-pay

## 2024-04-12 ENCOUNTER — Other Ambulatory Visit: Payer: Self-pay

## 2024-04-12 DIAGNOSIS — I10 Essential (primary) hypertension: Secondary | ICD-10-CM

## 2024-04-13 ENCOUNTER — Other Ambulatory Visit: Payer: Self-pay

## 2024-04-13 ENCOUNTER — Other Ambulatory Visit (HOSPITAL_COMMUNITY): Payer: Self-pay

## 2024-04-13 MED ORDER — TRIAMTERENE-HCTZ 37.5-25 MG PO TABS
1.0000 | ORAL_TABLET | Freq: Every day | ORAL | 3 refills | Status: AC
  Filled 2024-04-13: qty 90, 90d supply, fill #0

## 2024-04-15 ENCOUNTER — Other Ambulatory Visit: Payer: Self-pay | Admitting: Internal Medicine

## 2024-04-15 ENCOUNTER — Other Ambulatory Visit (HOSPITAL_COMMUNITY): Payer: Self-pay

## 2024-04-16 ENCOUNTER — Other Ambulatory Visit (HOSPITAL_COMMUNITY): Payer: Self-pay

## 2024-04-16 MED ORDER — GLIPIZIDE ER 10 MG PO TB24
20.0000 mg | ORAL_TABLET | Freq: Every day | ORAL | 0 refills | Status: DC
  Filled 2024-04-16 – 2024-04-17 (×2): qty 60, 30d supply, fill #0

## 2024-04-16 NOTE — Telephone Encounter (Signed)
 Refill request for glipizide .  She has been taking 2 tablets.  Updated script sent to pharmacy to allow patient to get medication as she is out and will discuss directions at upcoming appointment.

## 2024-04-17 ENCOUNTER — Other Ambulatory Visit (HOSPITAL_COMMUNITY): Payer: Self-pay

## 2024-04-20 ENCOUNTER — Ambulatory Visit: Admitting: Internal Medicine

## 2024-04-20 ENCOUNTER — Other Ambulatory Visit (HOSPITAL_COMMUNITY): Payer: Self-pay

## 2024-04-20 ENCOUNTER — Other Ambulatory Visit: Payer: Self-pay

## 2024-04-20 ENCOUNTER — Encounter: Payer: Self-pay | Admitting: Internal Medicine

## 2024-04-20 VITALS — BP 146/90 | Ht 63.0 in | Wt 194.0 lb

## 2024-04-20 DIAGNOSIS — Z794 Long term (current) use of insulin: Secondary | ICD-10-CM

## 2024-04-20 DIAGNOSIS — E785 Hyperlipidemia, unspecified: Secondary | ICD-10-CM

## 2024-04-20 DIAGNOSIS — E1165 Type 2 diabetes mellitus with hyperglycemia: Secondary | ICD-10-CM | POA: Diagnosis not present

## 2024-04-20 DIAGNOSIS — Z7985 Long-term (current) use of injectable non-insulin antidiabetic drugs: Secondary | ICD-10-CM

## 2024-04-20 DIAGNOSIS — Z7984 Long term (current) use of oral hypoglycemic drugs: Secondary | ICD-10-CM | POA: Diagnosis not present

## 2024-04-20 LAB — POCT GLYCOSYLATED HEMOGLOBIN (HGB A1C): Hemoglobin A1C: 10 % — AB (ref 4.0–5.6)

## 2024-04-20 MED ORDER — METFORMIN HCL ER 750 MG PO TB24
750.0000 mg | ORAL_TABLET | Freq: Every day | ORAL | 3 refills | Status: AC
  Filled 2024-04-20: qty 5, 5d supply, fill #0
  Filled 2024-04-20: qty 85, 85d supply, fill #0
  Filled 2024-04-20: qty 90, 90d supply, fill #0

## 2024-04-20 MED ORDER — FREESTYLE LITE W/DEVICE KIT
1.0000 | PACK | Freq: Every day | 0 refills | Status: AC
  Filled 2024-04-20: qty 1, 30d supply, fill #0

## 2024-04-20 MED ORDER — INSULIN PEN NEEDLE 32G X 4 MM MISC
1.0000 | Freq: Every day | 3 refills | Status: AC
  Filled 2024-04-20: qty 100, 90d supply, fill #0

## 2024-04-20 MED ORDER — GLIPIZIDE ER 10 MG PO TB24
10.0000 mg | ORAL_TABLET | Freq: Every day | ORAL | 3 refills | Status: AC
  Filled 2024-04-20: qty 90, 90d supply, fill #0

## 2024-04-20 MED ORDER — LANTUS SOLOSTAR 100 UNIT/ML ~~LOC~~ SOPN
18.0000 [IU] | PEN_INJECTOR | Freq: Every day | SUBCUTANEOUS | 11 refills | Status: AC
  Filled 2024-04-20: qty 15, 83d supply, fill #0
  Filled 2024-04-20: qty 6, 33d supply, fill #0

## 2024-04-20 MED ORDER — SEMAGLUTIDE (2 MG/DOSE) 8 MG/3ML ~~LOC~~ SOPN
2.0000 mg | PEN_INJECTOR | SUBCUTANEOUS | 3 refills | Status: AC
  Filled 2024-04-20 – 2024-05-06 (×2): qty 9, 84d supply, fill #0

## 2024-04-20 MED ORDER — FREESTYLE TEST VI STRP
1.0000 | ORAL_STRIP | Freq: Every day | 12 refills | Status: AC
  Filled 2024-04-20: qty 100, 90d supply, fill #0

## 2024-04-20 NOTE — Progress Notes (Signed)
 "      Name: Laura Marsh  Age/ Sex: 69 y.o., female   MRN/ DOB: [id number], [birth year]     PCP: Mercer Clotilda SAUNDERS, MD   Reason for Endocrinology Evaluation: Type 2 Diabetes Mellitus     Initial Endocrinology Clinic Visit: 06/16/2018    PATIENT IDENTIFIER: Laura Marsh is a 69 y.o. female with a past medical history of HTN, T2DM and Dyslipidemia. The patient has followed with Endocrinology clinic since 06/16/2018 for consultative assistance with management of her diabetes.  DIABETIC HISTORY:  Laura Marsh was diagnosed with T2DM ~ 2014. She has been on oral glycemic agents for years. She has never been on insulin  therapy. Her hemoglobin A1c has ranged from 6.5% in 2014, peaking at 11.0% in 2019.   On her initial visit to our clinic she was on Metformin , Glipizide  and Januvia , with an A1c 9.7 %.  We stopped Januvia  and glipizide  and started Ozempic  with continuation of Metformin     [facility]  I have attempted to screen her for Cushing's syndrome due to spontaneous hypokalemia and weight gain in 06/2020 but she did not return the urine sample.    Stopped Farxiga  01/2022 due to recurrent yeast infections  Switch Mounjaro  to Ozempic  per her request in August, 2025 with an A1c of 9.2%   SUBJECTIVE:   During the last visit (11/2023): A1c 9.2% .     Today (04/20/2024): Laura Marsh is here for a follow up on diabetes follow up.  She has not been checking glucose at home, as she has lost her glucose meter.  Since her last visit here she underwent rectal polyp resection in June, 2025, pathology report consistent with tubular adenoma, another polypectomy in 03/2024   Weight continues to fluctuate Bowel movements are alternating between constipation and diarrhea  She did have nausea and vomiting that she attributes to Metformin  and she decreased  it to 500 mg  She eats 3 meals a day, she does snack occasionally, she avoids sugar-sweetened  beverages     HOME DIABETES REGIMEN:  Metformin  1000 mg BID- takes 500 mg daily  Glipizide  10 mg XL , 2 tabs daily Ozempic  2 mg weekly Atorvastatin  40 mg daily    GLUCOSE LOG   N/a   DIABETIC COMPLICATIONS: Microvascular complications:   Denies: CKD, retinopathy, neuropathy  Last eye exam: Completed 02/17/2024    Macrovascular complications: Denies: CAD, PVD, CVA    HISTORY:  Past Medical History:  Past Medical History:  Diagnosis Date   Anxiety 2012   Colon polyp    Diabetes mellitus    Elevated cholesterol    Hypertension 2012   Past Surgical History:  Past Surgical History:  Procedure Laterality Date   ABDOMINAL HYSTERECTOMY     COLONOSCOPY     COLONOSCOPY N/A 09/15/2022   Procedure: COLONOSCOPY;  Surgeon: Toledo, Ladell POUR, MD;  Location: [facility];  Service: Gastroenterology;  Laterality: N/A;   COLONOSCOPY WITH PROPOFOL  N/A 11/08/2017   Procedure: COLONOSCOPY WITH PROPOFOL ;  Surgeon: Toledo, Ladell POUR, MD;  Location: [facility];  Service: Gastroenterology;  Laterality: N/A;   COLONOSCOPY WITH PROPOFOL  N/A 04/28/2020   Procedure: COLONOSCOPY WITH PROPOFOL ;  Surgeon: Tye Millet, DO;  Location: [facility];  Service: General;  Laterality: N/A;   Social History:  reports that she has never smoked. She has never used smokeless tobacco. She reports current alcohol  use. She reports that she does not use drugs. Family History:  Family History  Problem Relation Age of Onset   Diabetes Mother    Congestive Heart Failure Mother      HOME MEDICATIONS: Allergies as of 04/20/2024       Reactions   Trulicity  [dulaglutide ] Itching   Rash/itching at injection site        Medication List        Accurate as of April 20, 2024  9:12 AM. If you have any questions, ask your nurse or doctor.          STOP taking these medications    fluconazole  150 MG tablet Commonly known as: DIFLUCAN  Stopped by: Donell Butts, MD   Na Sulfate-K  Sulfate-Mg Sulfate concentrate 17.5-3.13-1.6 GM/177ML Soln Commonly known as: SUPREP Stopped by: Donell Butts, MD       TAKE these medications    albuterol  108 (90 Base) MCG/ACT inhaler Commonly known as: VENTOLIN  HFA Inhale 1-2 puffs into the lungs every 6 (six) hours as needed for wheezing or shortness of breath.   Alcohol  Swabs  Pads Use as directed.   amLODipine  5 MG tablet Commonly known as: NORVASC  Take 1 tablet (5 mg total) by mouth daily.   aspirin EC 81 MG tablet Take 81 mg by mouth daily. Swallow whole. Taken it twice a week What changed:  when to take this reasons to take this   atorvastatin  40 MG tablet Commonly known as: LIPITOR Take 1 tablet (40 mg total) by mouth daily.   estradiol  0.1 MG/GM vaginal cream Commonly known as: ESTRACE  Place 1 g vaginally 2 (two) times a week.   fluticasone  50 MCG/ACT nasal spray Commonly known as: FLONASE  Place 2 sprays into both nostrils daily.   FREESTYLE LITE test strip Generic drug: glucose blood Use to test blood sugar 2 (two) times daily.   gabapentin  100 MG capsule Commonly known as: NEURONTIN  Take 1 capsule (100 mg total) by mouth 3 (three) times daily as needed (pain, burning of rash).   glipiZIDE  10 MG 24 hr tablet Commonly known as: glipiZIDE  XL Take 2 tablets (20 mg total) by mouth daily with breakfast.   lisinopril  30 MG tablet Commonly known as: ZESTRIL  Take 1 tablet (30 mg total) by mouth daily.   loratadine 10 MG tablet Commonly known as: CLARITIN Take 10 mg by mouth daily as needed.   METAMUCIL FIBER PO Take by mouth in the morning and at bedtime. What changed: when to take this   metFORMIN  1000 MG tablet Commonly known as: GLUCOPHAGE  Take 1 tablet (1,000 mg total) by mouth 2 (two) times daily with a meal.   multivitamin with minerals Tabs tablet Take 1 tablet by mouth daily.   nystatin  cream Commonly known as: MYCOSTATIN  Apply topically 2 (two) times daily.    nystatin -triamcinolone  ointment Commonly known as: MYCOLOG Apply 1 Application topically to affected areas 2 (two) times daily.   Ozempic  (2 MG/DOSE) 8 MG/3ML Sopn Generic drug: Semaglutide  (2 MG/DOSE) Inject 2 mg as directed once a week.   triamterene -hydrochlorothiazide  37.5-25 MG tablet Commonly known as: MAXZIDE -25 Take 1 tablet by mouth daily.      PHYSICAL EXAM: VS: BP (!) 146/90   Ht 5' 3 (1.6 m)   Wt 194 lb (88 kg)   BMI 34.37 kg/m    EXAM: General: Pt appears well and is in NAD  Lungs: Clear with good BS bilat   Heart: Auscultation: RRR    LE: Trace pitting edema   Mental Status: Judgment, insight: intact Mood and affect: no depression, anxiety, or agitation  DM Foot exam 11/25/2023  The skin of the feet is intact without sores or ulcerations. The pedal pulses are 2+ on right and 2+ on left. The sensation is intact to a screening 5.07, 10 gram monofilament bilaterally    DATA REVIEWED:  Lab Results  Component Value Date   HGBA1C 10.0 (A) 04/20/2024   HGBA1C 9.2 (A) 11/25/2023   HGBA1C 9.2 (A) 06/03/2023     Latest Reference Range & Units 11/25/23 08:49  Sodium 135 - 146 mmol/L 140  Potassium 3.5 - 5.3 mmol/L 3.7  Chloride 98 - 110 mmol/L 102  CO2 20 - 32 mmol/L 28  Glucose 65 - 99 mg/dL 773 (H)  BUN 7 - 25 mg/dL 17  Creatinine 9.49 - 8.94 mg/dL 9.17  Calcium  8.6 - 10.4 mg/dL 89.3 (H)  BUN/Creatinine Ratio 6 - 22 (calc) SEE NOTE:  eGFR > OR = 60 mL/min/1.33m2 79  Total CHOL/HDL Ratio <5.0 (calc) 3.7  Cholesterol <200 mg/dL 782 (H)  HDL Cholesterol > OR = 50 mg/dL 59  LDL Cholesterol (Calc) mg/dL (calc) 867 (H)  MICROALB/CREAT RATIO <30 mg/g creat 21  Non-HDL Cholesterol (Calc) <130 mg/dL (calc) 841 (H)  Triglycerides <150 mg/dL 854    Latest Reference Range & Units 11/25/23 08:49  Microalb, Ur mg/dL 3.3  MICROALB/CREAT RATIO <30 mg/g creat 21  Creatinine, Urine 20 - 275 mg/dL 840       Old records , labs and images have been  reviewed.    ASSESSMENT / PLAN / RECOMMENDATIONS:   1) Type 2 Diabetes Mellitus, Poorly controlled, Without complications - Most recent A1c of 10.0 %. Goal A1c < 7.0 %.    - Patient continues with worsening glycemic control  - Will switch Mounjaro  to Ozempic  based on her request in 2025 -Intolerant to Trulicity  -Intolerant to Farxiga  -Despite history of intolerance to higher metformin  doses, the patient opted to try 2000 mg a day, but unfortunately developed GI side effects, will decrease the dose as prior -Not a candidate for pioglitazone due to lower extremity edema - I have again recommended insulin , patient under the impression insulin  causes for sores, a referral to our RD has been placed for further education and to discuss low-carb diet - I did encourage the patient to check glucose at home, we discussed the benefits of improving glycemic control to prevent microvascular complications including blindness, ESRD, and foot amputations  MEDICATIONS:   Change metformin  750 mg XR daily Decrease glipizide  10 mg XL, 1 tablet daily  Continue Ozempic  2 mg weekly  Start Lantus  18 units daily      EDUCATION / INSTRUCTIONS: BG monitoring instructions: Patient is instructed to check her blood sugars 2 times a day, fasting and bedtime. Call [facility] if: BG persistently < 70  I reviewed the Rule of 15 for the treatment of hypoglycemia in detail with the patient. Literature supplied.  2) Diabetic complications:  Eye: Does not have known diabetic retinopathy.  Neuro/ Feet: Does not have known diabetic peripheral neuropathy. Renal: Patient does not have known baseline CKD. She is  on an ACEI/ARB at present.  Micro albumin urea testing is normal and up-to-date  3) Dyslipidemia :   - LDL remains above goal - I had increased atorvastatin  in 2025 - Encouraged low-fat diet - Will continue to monitor  Medication Continue atorvastatin  40 mg daily  F/U in  3 months     Signed electronically by: Stefano Redgie Butts, MD  [facility]  [facility]  [address] [place], [place] [address] Phone: [phone] FAX: [phone]   CC: Mercer Clotilda SAUNDERS, MD [address] [place] [address] Phone: [phone]  Fax: [phone]  Return to Endocrinology clinic as below: No future appointments.        "

## 2024-04-20 NOTE — Patient Instructions (Addendum)
-   Change Metformin  750 mg , once daily - Take glipizide  10 mg XL, ONE tablet daily before breakfast - Continue Ozempic  2 mg weekly  - Start Lantus  18 units, daily   HOW TO TREAT LOW BLOOD SUGARS (Blood sugar LESS THAN 70 MG/DL) Please follow the RULE OF 15 for the treatment of hypoglycemia treatment (when your (blood sugars are less than 70 mg/dL)   STEP 1: Take 15 grams of carbohydrates when your blood sugar is low, which includes:  3-4 GLUCOSE TABS  OR 3-4 OZ OF JUICE OR REGULAR SODA OR ONE TUBE OF GLUCOSE GEL    STEP 2: RECHECK blood sugar in 15 MINUTES STEP 3: If your blood sugar is still low at the 15 minute recheck --> then, go back to STEP 1 and treat AGAIN with another 15 grams of carbohydrates.

## 2024-04-21 ENCOUNTER — Other Ambulatory Visit (HOSPITAL_COMMUNITY): Payer: Self-pay

## 2024-04-23 ENCOUNTER — Other Ambulatory Visit (HOSPITAL_COMMUNITY): Payer: Self-pay

## 2024-05-07 ENCOUNTER — Other Ambulatory Visit (HOSPITAL_COMMUNITY): Payer: Self-pay

## 2024-05-07 ENCOUNTER — Other Ambulatory Visit: Payer: Self-pay

## 2024-05-08 ENCOUNTER — Other Ambulatory Visit: Payer: Self-pay

## 2024-05-18 ENCOUNTER — Encounter: Admitting: Dietician

## 2024-07-20 ENCOUNTER — Ambulatory Visit: Admitting: Internal Medicine
